# Patient Record
Sex: Male | Born: 1995 | Race: Black or African American | Hispanic: No | Marital: Single | Smoking: Current every day smoker
Health system: Southern US, Community
[De-identification: ages and names within clinical notes are randomized; demographics above are authoritative.]

## PROBLEM LIST (undated history)

## (undated) DIAGNOSIS — F909 Attention-deficit hyperactivity disorder, unspecified type: Secondary | ICD-10-CM

## (undated) DIAGNOSIS — K4090 Unilateral inguinal hernia, without obstruction or gangrene, not specified as recurrent: Secondary | ICD-10-CM

## (undated) DIAGNOSIS — F329 Major depressive disorder, single episode, unspecified: Secondary | ICD-10-CM

## (undated) DIAGNOSIS — S62339A Displaced fracture of neck of unspecified metacarpal bone, initial encounter for closed fracture: Secondary | ICD-10-CM

## (undated) DIAGNOSIS — B350 Tinea barbae and tinea capitis: Secondary | ICD-10-CM

## (undated) DIAGNOSIS — F32A Depression, unspecified: Secondary | ICD-10-CM

## (undated) DIAGNOSIS — F913 Oppositional defiant disorder: Secondary | ICD-10-CM

## (undated) DIAGNOSIS — F1910 Other psychoactive substance abuse, uncomplicated: Secondary | ICD-10-CM

## (undated) DIAGNOSIS — F419 Anxiety disorder, unspecified: Secondary | ICD-10-CM

## (undated) HISTORY — PX: HAND SURGERY: SHX662

## (undated) HISTORY — DX: Displaced fracture of neck of unspecified metacarpal bone, initial encounter for closed fracture: S62.339A

## (undated) HISTORY — DX: Attention-deficit hyperactivity disorder, unspecified type: F90.9

## (undated) HISTORY — DX: Anxiety disorder, unspecified: F41.9

## (undated) HISTORY — PX: HERNIA REPAIR: SHX51

## (undated) HISTORY — DX: Other psychoactive substance abuse, uncomplicated: F19.10

## (undated) HISTORY — DX: Tinea barbae and tinea capitis: B35.0

## (undated) HISTORY — DX: Oppositional defiant disorder: F91.3

---
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
## 2017-02-01 ENCOUNTER — Encounter (HOSPITAL_COMMUNITY): Payer: Self-pay | Admitting: *Deleted

## 2017-02-01 ENCOUNTER — Other Ambulatory Visit: Payer: Self-pay

## 2017-02-01 ENCOUNTER — Emergency Department (HOSPITAL_COMMUNITY)
Admission: EM | Admit: 2017-02-01 | Discharge: 2017-02-01 | Disposition: A | Discharge status: Home / Self Care | Payer: Medicaid Other | Attending: Emergency Medicine | Admitting: Emergency Medicine

## 2017-02-01 ENCOUNTER — Emergency Department (HOSPITAL_COMMUNITY): Payer: Medicaid Other

## 2017-02-01 DIAGNOSIS — F1290 Cannabis use, unspecified, uncomplicated: Secondary | ICD-10-CM | POA: Diagnosis not present

## 2017-02-01 DIAGNOSIS — R0981 Nasal congestion: Secondary | ICD-10-CM | POA: Diagnosis present

## 2017-02-01 DIAGNOSIS — F909 Attention-deficit hyperactivity disorder, unspecified type: Secondary | ICD-10-CM | POA: Diagnosis not present

## 2017-02-01 DIAGNOSIS — F17200 Nicotine dependence, unspecified, uncomplicated: Secondary | ICD-10-CM | POA: Diagnosis not present

## 2017-02-01 DIAGNOSIS — J09X2 Influenza due to identified novel influenza A virus with other respiratory manifestations: Secondary | ICD-10-CM | POA: Diagnosis not present

## 2017-02-01 DIAGNOSIS — F329 Major depressive disorder, single episode, unspecified: Secondary | ICD-10-CM | POA: Insufficient documentation

## 2017-02-01 DIAGNOSIS — J101 Influenza due to other identified influenza virus with other respiratory manifestations: Secondary | ICD-10-CM

## 2017-02-01 LAB — INFLUENZA PANEL BY PCR (TYPE A & B)
Influenza A By PCR: POSITIVE — AB
Influenza B By PCR: NEGATIVE

## 2017-02-01 LAB — RAPID STREP SCREEN (MED CTR MEBANE ONLY): Streptococcus, Group A Screen (Direct): NEGATIVE

## 2017-02-01 MED ORDER — OSELTAMIVIR PHOSPHATE 75 MG PO CAPS: 75.0000 mg | ORAL_CAPSULE | Freq: Two times a day (BID) | ORAL | 0 refills | Status: DC

## 2017-02-01 MED ORDER — BENZONATATE 100 MG PO CAPS: 100.0000 mg | ORAL_CAPSULE | Freq: Three times a day (TID) | ORAL | 0 refills | Status: DC

## 2017-02-01 MED ORDER — IBUPROFEN 400 MG PO TABS
600.0000 mg | ORAL_TABLET | Freq: Once | ORAL | Status: AC
  Administered 2017-02-01: 600 mg via ORAL
  Filled 2017-02-01: qty 1

## 2017-02-01 NOTE — ED Provider Notes (Signed)
[facility] Provider Note   CSN: [id number] Arrival date & time: 02/01/17  1814     History   Chief Complaint Chief Complaint  Patient presents with  . Sore Throat    HPI Andrew Page is a 21 y.o. male.  HPI 21 year old African-American male with no pertinent past medical history presents to the emergency department today with complaints of influenza-like symptoms.  Patient complains of 2 days of nasal congestion, rhinorrhea, chest congestion, productive cough, fever, chills, sore throat, generalized body aches.  Patient has tried over-the-counter cold and flu symptoms with only little relief.  Reports sick contacts with the same symptoms.  Patient denies any associated nausea, vomiting, or urinary symptoms.  Nothing makes his symptoms better or worse. Past Medical History:  Diagnosis Date  . Attention deficit hyperactivity disorder (ADHD)   . Boxers fracture 07/02/11   Cast applied in the ED. Removed by Ortho.  . Depression   . Inguinal hernia    RIGHT  . Oppositional defiant disorder 3/07   Borderline/ low average intelligence (Questionable)  . Tinea capitis     Patient Active Problem List   Diagnosis Date Noted  . Depression 07/22/2014  . Substance abuse (HCC) 07/22/2014  . Right hydrocele 07/22/2014  . Attention deficit hyperactivity disorder (ADHD) 04/18/2006    Past Surgical History:  Procedure Laterality Date  . HAND SURGERY    . HERNIA REPAIR    . INGUINAL HERNIA REPAIR Right 12/10/2014   Procedure: LAPAROSCOPIC RIGHT INGUINAL HERNIA REPAIR WITH MESH;  Surgeon: Axel FillerArmando Ramirez, MD;  Location: [facility];  Service: General;  Laterality: Right;  . INSERTION OF MESH Right 12/10/2014   Procedure: INSERTION OF MESH;  Surgeon: Axel FillerArmando Ramirez, MD;  Location: [facility];  Service: General;  Laterality: Right;       Home Medications    Prior to Admission medications   Medication Sig Start Date End Date Taking? Authorizing  Provider  benzonatate (TESSALON) 100 MG capsule Take 1 capsule (100 mg total) by mouth every 8 (eight) hours. 02/01/17   Rise MuLeaphart, Martia Dalby T, PA-C  ibuprofen (ADVIL,MOTRIN) 600 MG tablet Take 1 tablet (600 mg total) by mouth every 6 (six) hours as needed. 07/03/16   Janne NapoleonNeese, Hope M, NP  oseltamivir (TAMIFLU) 75 MG capsule Take 1 capsule (75 mg total) by mouth 2 (two) times daily. 02/01/17   Rise MuLeaphart, Mansi Tokar T, PA-C    Family History Family History  Problem Relation Age of Onset  . Eczema Unknown        elbows, behind the knees, worse in the summer. Previously treated with triamcinolonce 0.1% cream with sucess.  . Eczema Brother        Twin  . Hypertension Mother   . Diabetes Maternal Grandfather     Social History Social History   Tobacco Use  . Smoking status: Current Some Day Smoker  . Smokeless tobacco: Never Used  Substance Use Topics  . Alcohol use: No  . Drug use: Yes    Types: Marijuana     Allergies   Patient has no known allergies.   Review of Systems Review of Systems  Constitutional: Positive for appetite change, chills and fever. Negative for activity change.  HENT: Positive for congestion, rhinorrhea, sinus pressure, sinus pain, sneezing and sore throat. Negative for ear pain.   Respiratory: Positive for cough. Negative for shortness of breath.   Cardiovascular: Negative for chest pain.  Gastrointestinal: Positive for abdominal pain. Negative for diarrhea, nausea and vomiting.  Genitourinary: Negative for dysuria, flank pain, frequency, hematuria and urgency.  Musculoskeletal: Positive for myalgias.  Skin: Negative for rash.  Neurological: Positive for headaches.     Physical Exam Updated Vital Signs BP 101/86 (BP Location: Right Arm)   Pulse 95   Temp 98.7 F (37.1 C) (Oral)   Resp 16   SpO2 99%   Physical Exam  Constitutional: He appears well-developed and well-nourished. No distress.  HENT:  Head: Normocephalic and atraumatic.  Right Ear:  Tympanic membrane and ear canal normal.  Left Ear: Tympanic membrane and ear canal normal.  Mouth/Throat: Uvula is midline and mucous membranes are normal. No uvula swelling. Posterior oropharyngeal erythema present. No oropharyngeal exudate, posterior oropharyngeal edema or tonsillar abscesses. Tonsils are 1+ on the right. Tonsils are 1+ on the left. No tonsillar exudate.  Eyes: Pupils are equal, round, and reactive to light. Right eye exhibits no discharge. Left eye exhibits no discharge. No scleral icterus.  Neck: Normal range of motion. Neck supple.  No c spine midline tenderness. No paraspinal tenderness. No deformities or step offs noted. Full ROM. Supple. No nuchal rigidity.    Cardiovascular: Normal rate, regular rhythm, normal heart sounds and intact distal pulses. Exam reveals no gallop and no friction rub.  No murmur heard. Pulmonary/Chest: Effort normal and breath sounds normal. No stridor. No respiratory distress. He has no wheezes. He has no rhonchi. He has no rales. He exhibits no tenderness.  Abdominal: Soft. Bowel sounds are normal. There is no tenderness. There is no rebound and no guarding.  Musculoskeletal: Normal range of motion.  Lymphadenopathy:    He has no cervical adenopathy.  Neurological: He is alert.  Skin: Skin is warm and dry. Capillary refill takes less than 2 seconds. No pallor.  Psychiatric: His behavior is normal. Judgment and thought content normal.  Nursing note and vitals reviewed.    ED Treatments / Results  Labs (all labs ordered are listed, but only abnormal results are displayed) Labs Reviewed  INFLUENZA PANEL BY PCR (TYPE A & B) - Abnormal; Notable for the following components:      Result Value   Influenza A By PCR POSITIVE (*)    All other components within normal limits  RAPID STREP SCREEN (NOT AT [facility])  CULTURE, GROUP A STREP [facility])    EKG  EKG Interpretation None       Radiology Dg Chest 2 View  Result Date:  02/01/2017 CLINICAL DATA:  Cough and fever.  Flu like symptoms. EXAM: CHEST  2 VIEW COMPARISON:  None FINDINGS: The heart size and mediastinal contours are within normal limits. Both lungs are clear. The visualized skeletal structures are unremarkable. IMPRESSION: No active cardiopulmonary disease. Electronically Signed   By: Signa Kell M.D.   On: 02/01/2017 20:56    Procedures Procedures (including critical care time)  Medications Ordered in ED Medications  ibuprofen (ADVIL,MOTRIN) tablet 600 mg (600 mg Oral Given 02/01/17 2020)     Initial Impression / Assessment and Plan / ED Course  I have reviewed the triage vital signs and the nursing notes.  Pertinent labs & imaging results that were available during my care of the patient were reviewed by me and considered in my medical decision making (see chart for details).     Patient with symptoms consistent with influenza.  Vitals are stable, low-grade fever.  No signs of dehydration, tolerating PO's.  Lungs are clear.  Tamiflu provided at patient's discretion.  Patient will be discharged with instructions to orally  hydrate, rest, and use over-the-counter medications such as anti-inflammatories ibuprofen and Aleve for muscle aches and Tylenol for fever.  Patient will also be given a cough suppressant.    Final Clinical Impressions(s) / ED Diagnoses   Final diagnoses:  Influenza A    ED Discharge Orders        Ordered    oseltamivir (TAMIFLU) 75 MG capsule  2 times daily     02/01/17 2159    benzonatate (TESSALON) 100 MG capsule  Every 8 hours     02/01/17 2159       Wallace KellerLeaphart, Kellye Mizner T, PA-C 02/01/17 2205    Raeford RazorKohut, Stephen, MD 02/05/17 1143

## 2017-02-01 NOTE — ED Triage Notes (Signed)
Pt in c/o sore throat & generalized pain onset x 2 days ago, pt denies current n\/v/d, A&O x4

## 2017-02-01 NOTE — Discharge Instructions (Signed)
You are positive for the flu.  Have given you Tamiflu to take if you would like however this is not a treatment for the flu.  Symptomatic treatment includes Motrin and Tylenol.  Have given you Tessalon for cough.  We use an over-the-counter decongestant.  Drink plenty of fluids.  Follow-up with your primary care doctor.  Return to the ED with any worsening symptoms.

## 2017-02-01 NOTE — ED Notes (Signed)
Pt departed in NAD, refused use of wheelchair.  

## 2017-02-01 NOTE — ED Notes (Signed)
ED Provider at bedside. 

## 2017-02-04 LAB — CULTURE, GROUP A STREP (THRC)

## 2017-09-21 ENCOUNTER — Emergency Department (HOSPITAL_COMMUNITY): Payer: Self-pay

## 2017-09-21 ENCOUNTER — Emergency Department (HOSPITAL_COMMUNITY)
Admission: EM | Admit: 2017-09-21 | Discharge: 2017-09-21 | Disposition: A | Discharge status: Home / Self Care | Payer: Self-pay | Attending: Emergency Medicine | Admitting: Emergency Medicine

## 2017-09-21 ENCOUNTER — Encounter (HOSPITAL_COMMUNITY): Payer: Self-pay | Admitting: Emergency Medicine

## 2017-09-21 ENCOUNTER — Other Ambulatory Visit: Payer: Self-pay

## 2017-09-21 DIAGNOSIS — Y939 Activity, unspecified: Secondary | ICD-10-CM | POA: Insufficient documentation

## 2017-09-21 DIAGNOSIS — S62342A Nondisplaced fracture of base of third metacarpal bone, right hand, initial encounter for closed fracture: Secondary | ICD-10-CM | POA: Insufficient documentation

## 2017-09-21 DIAGNOSIS — Y999 Unspecified external cause status: Secondary | ICD-10-CM | POA: Insufficient documentation

## 2017-09-21 DIAGNOSIS — Y929 Unspecified place or not applicable: Secondary | ICD-10-CM | POA: Insufficient documentation

## 2017-09-21 DIAGNOSIS — W2209XA Striking against other stationary object, initial encounter: Secondary | ICD-10-CM | POA: Insufficient documentation

## 2017-09-21 DIAGNOSIS — F17200 Nicotine dependence, unspecified, uncomplicated: Secondary | ICD-10-CM | POA: Insufficient documentation

## 2017-09-21 DIAGNOSIS — Z79899 Other long term (current) drug therapy: Secondary | ICD-10-CM | POA: Insufficient documentation

## 2017-09-21 MED ORDER — NAPROXEN 500 MG PO TABS: 500.0000 mg | ORAL_TABLET | Freq: Two times a day (BID) | ORAL | 0 refills | Status: DC

## 2017-09-21 NOTE — ED Triage Notes (Signed)
Pt to ER for evaluation of right swollen knuckle after punching a pole one hour ago due to anger.

## 2017-09-21 NOTE — ED Notes (Signed)
Ortho tech aware 

## 2017-09-21 NOTE — ED Provider Notes (Signed)
[facility] Provider Note   CSN: [id number] Arrival date & time: 09/21/17  1158     History   Chief Complaint Chief Complaint  Patient presents with  . Hand Pain    HPI Andrew Page is a 22 y.o. male who presents to the ED for right hand pain after he punched a pole one hour ago due to being angry. Patient had injured the same hand in the past and injured his fifth MC at that time. Patient has deformity to the right little finger due to the previous injury.  HPI  Past Medical History:  Diagnosis Date  . Attention deficit hyperactivity disorder (ADHD)   . Boxers fracture 07/02/11   Cast applied in the ED. Removed by Ortho.  . Depression   . Inguinal hernia    RIGHT  . Oppositional defiant disorder 3/07   Borderline/ low average intelligence (Questionable)  . Tinea capitis     Patient Active Problem List   Diagnosis Date Noted  . Depression 07/22/2014  . Substance abuse (HCC) 07/22/2014  . Right hydrocele 07/22/2014  . Attention deficit hyperactivity disorder (ADHD) 04/18/2006    Past Surgical History:  Procedure Laterality Date  . HAND SURGERY    . HERNIA REPAIR    . INGUINAL HERNIA REPAIR Right 12/10/2014   Procedure: LAPAROSCOPIC RIGHT INGUINAL HERNIA REPAIR WITH MESH;  Surgeon: Axel FillerArmando Ramirez, MD;  Location: [facility];  Service: General;  Laterality: Right;  . INSERTION OF MESH Right 12/10/2014   Procedure: INSERTION OF MESH;  Surgeon: Axel FillerArmando Ramirez, MD;  Location: [facility];  Service: General;  Laterality: Right;        Home Medications    Prior to Admission medications   Medication Sig Start Date End Date Taking? Authorizing Provider  benzonatate (TESSALON) 100 MG capsule Take 1 capsule (100 mg total) by mouth every 8 (eight) hours. 02/01/17   Rise MuLeaphart, Kenneth T, PA-C  naproxen (NAPROSYN) 500 MG tablet Take 1 tablet (500 mg total) by mouth 2 (two) times daily. 09/21/17   Janne NapoleonNeese, Hope M, NP  oseltamivir (TAMIFLU) 75 MG  capsule Take 1 capsule (75 mg total) by mouth 2 (two) times daily. 02/01/17   Rise MuLeaphart, Kenneth T, PA-C    Family History Family History  Problem Relation Age of Onset  . Eczema Unknown        elbows, behind the knees, worse in the summer. Previously treated with triamcinolonce 0.1% cream with sucess.  . Eczema Brother        Twin  . Hypertension Mother   . Diabetes Maternal Grandfather     Social History Social History   Tobacco Use  . Smoking status: Current Some Day Smoker  . Smokeless tobacco: Never Used  Substance Use Topics  . Alcohol use: No  . Drug use: Yes    Types: Marijuana     Allergies   Patient has no known allergies.   Review of Systems Review of Systems  Musculoskeletal: Positive for arthralgias.       Right hand  All other systems reviewed and are negative.    Physical Exam Updated Vital Signs BP 126/79 (BP Location: Left Arm)   Pulse (!) 58   Temp 98.8 F (37.1 C) (Oral)   Resp 14   SpO2 100%   Physical Exam  Constitutional: He appears well-developed and well-nourished. No distress.  HENT:  Head: Normocephalic.  Eyes: EOM are normal.  Neck: Neck supple.  Cardiovascular: Normal rate.  Pulmonary/Chest: Effort normal.  Musculoskeletal:       Right hand: He exhibits tenderness and swelling. He exhibits normal range of motion, normal capillary refill, no deformity and no laceration. Normal sensation noted. Normal strength noted. He exhibits no thumb/finger opposition.       Hands: Neurological: He is alert.  Skin: Skin is warm and dry.  Psychiatric: He has a normal mood and affect.  Nursing note and vitals reviewed.    ED Treatments / Results  Labs (all labs ordered are listed, but only abnormal results are displayed) Labs Reviewed - No data to display Radiology Dg Hand Complete Right  Result Date: 09/21/2017 CLINICAL DATA:  Patient punched a wall in his home. EXAM: RIGHT HAND - COMPLETE 3+ VIEW COMPARISON:  07/03/2016 FINDINGS:  There is an acute fracture of the neck of the third metacarpal. Fracture is comminuted but associated with minimal displacement. There is deformity of the 5th metacarpal, consistent with remote injury. No radiopaque foreign body or soft tissue gas. IMPRESSION: Acute fracture of the third metacarpal. Remote fracture of the 5th metacarpal. Electronically Signed   By: Norva Pavlov M.D.   On: 09/21/2017 13:14    Procedures Procedures (including critical care time)  Medications Ordered in ED Medications - No data to display   Initial Impression / Assessment and Plan / ED Course  I have reviewed the triage vital signs and the nursing notes. 22 y.o. male here with pain and swelling to the dorsum of the right hand s/p hitting a pole stable for d/c without focal neuro deficits. X-ray shows a nondisplaced  fracture of the third MC. Splint applied, ice, elevation and NSAIDS. F/u with ortho.  Final Clinical Impressions(s) / ED Diagnoses   Final diagnoses:  Closed nondisplaced fracture of base of third metacarpal bone of right hand, initial encounter    ED Discharge Orders        Ordered    naproxen (NAPROSYN) 500 MG tablet  2 times daily     09/21/17 1429       Damian Leavell [place], [place] 09/21/17 1735    Raeford Razor, MD 09/22/17 1737

## 2018-03-08 ENCOUNTER — Emergency Department (HOSPITAL_COMMUNITY)
Admission: EM | Admit: 2018-03-08 | Discharge: 2018-03-08 | Disposition: A | Discharge status: Home / Self Care | Payer: Self-pay | Attending: Emergency Medicine | Admitting: Emergency Medicine

## 2018-03-08 ENCOUNTER — Encounter (HOSPITAL_COMMUNITY): Payer: Self-pay

## 2018-03-08 ENCOUNTER — Emergency Department (HOSPITAL_COMMUNITY): Payer: Self-pay

## 2018-03-08 ENCOUNTER — Other Ambulatory Visit: Payer: Self-pay

## 2018-03-08 DIAGNOSIS — R6889 Other general symptoms and signs: Secondary | ICD-10-CM

## 2018-03-08 DIAGNOSIS — F17210 Nicotine dependence, cigarettes, uncomplicated: Secondary | ICD-10-CM | POA: Insufficient documentation

## 2018-03-08 DIAGNOSIS — B9789 Other viral agents as the cause of diseases classified elsewhere: Secondary | ICD-10-CM

## 2018-03-08 DIAGNOSIS — J069 Acute upper respiratory infection, unspecified: Secondary | ICD-10-CM

## 2018-03-08 DIAGNOSIS — J111 Influenza due to unidentified influenza virus with other respiratory manifestations: Secondary | ICD-10-CM | POA: Insufficient documentation

## 2018-03-08 LAB — GROUP A STREP BY PCR: Group A Strep by PCR: NOT DETECTED

## 2018-03-08 MED ORDER — ACETAMINOPHEN 325 MG PO TABS
650.0000 mg | ORAL_TABLET | Freq: Once | ORAL | Status: AC | PRN
  Administered 2018-03-08: 650 mg via ORAL
  Filled 2018-03-08: qty 2

## 2018-03-08 NOTE — ED Triage Notes (Signed)
Patient is AOx4 and ambulatory. Patient arrived via POV with mother at bedside. Patient chief complaint is cough and Flu like symptoms that began 2-3 days ago. Patient is also complaining of sore throat and states "it feels like something is stuck in my throat".

## 2018-03-08 NOTE — ED Provider Notes (Signed)
[facility]-EMERGENCY DEPT Provider Note   CSN: [id number] Arrival date & time: 03/08/18  1551     History   Chief Complaint Chief Complaint  Patient presents with  . Flu Like Symptoms  . Cough    HPI Andrew Page is a 23 y.o. male with a PMHx of ADHD, depression, ODD, and substance abuse, who presents to the ED with complaints of flulike symptoms that began 3 days ago.  His symptoms include sore throat, rhinorrhea, congestion, cough with yellow sputum production, and chills.  He has been trying Alka-Seltzer without relief of his symptoms.  No known aggravating factors.  He is a non-smoker, did not receive a flu shot this year, but has had sick contacts recently.  He denies any body aches, drooling, trismus, ear pain or drainage, fevers, CP, SOB, abd pain, N/V/D/C, hematuria, dysuria, myalgias, arthralgias, numbness, tingling, focal weakness, or any other complaints at this time.   The history is provided by the patient and medical records. No language interpreter was used.    Past Medical History:  Diagnosis Date  . Attention deficit hyperactivity disorder (ADHD)   . Boxers fracture 07/02/11   Cast applied in the ED. Removed by Ortho.  . Depression   . Inguinal hernia    RIGHT  . Oppositional defiant disorder 3/07   Borderline/ low average intelligence (Questionable)  . Tinea capitis     Patient Active Problem List   Diagnosis Date Noted  . Depression 07/22/2014  . Substance abuse (HCC) 07/22/2014  . Right hydrocele 07/22/2014  . Attention deficit hyperactivity disorder (ADHD) 04/18/2006    Past Surgical History:  Procedure Laterality Date  . HAND SURGERY    . HERNIA REPAIR    . INGUINAL HERNIA REPAIR Right 12/10/2014   Procedure: LAPAROSCOPIC RIGHT INGUINAL HERNIA REPAIR WITH MESH;  Surgeon: Axel Filler, MD;  Location: [facility];  Service: General;  Laterality: Right;  . INSERTION OF MESH Right 12/10/2014   Procedure: INSERTION OF MESH;   Surgeon: Axel Filler, MD;  Location: [facility];  Service: General;  Laterality: Right;        Home Medications    Prior to Admission medications   Medication Sig Start Date End Date Taking? Authorizing Provider  benzonatate (TESSALON) 100 MG capsule Take 1 capsule (100 mg total) by mouth every 8 (eight) hours. Patient not taking: Reported on 03/08/2018 02/01/17   Demetrios Loll T, PA-C  naproxen (NAPROSYN) 500 MG tablet Take 1 tablet (500 mg total) by mouth 2 (two) times daily. Patient not taking: Reported on 03/08/2018 09/21/17   Janne Napoleon, NP  oseltamivir (TAMIFLU) 75 MG capsule Take 1 capsule (75 mg total) by mouth 2 (two) times daily. Patient not taking: Reported on 03/08/2018 02/01/17   Rise Mu, PA-C    Family History Family History  Problem Relation Age of Onset  . Eczema Other        elbows, behind the knees, worse in the summer. Previously treated with triamcinolonce 0.1% cream with sucess.  . Eczema Brother        Twin  . Hypertension Mother   . Diabetes Maternal Grandfather     Social History Social History   Tobacco Use  . Smoking status: Current Every Day Smoker    Packs/day: 1.00    Years: 4.00    Pack years: 4.00  . Smokeless tobacco: Never Used  Substance Use Topics  . Alcohol use: No  . Drug use: Not Currently    Types:  Marijuana     Allergies   Patient has no known allergies.   Review of Systems Review of Systems  Constitutional: Positive for chills. Negative for fever.  HENT: Positive for congestion, rhinorrhea and sore throat. Negative for drooling, ear discharge, ear pain and trouble swallowing.   Respiratory: Positive for cough. Negative for shortness of breath.   Cardiovascular: Negative for chest pain.  Gastrointestinal: Negative for abdominal pain, constipation, diarrhea, nausea and vomiting.  Genitourinary: Negative for dysuria and hematuria.  Musculoskeletal: Negative for arthralgias and myalgias.  Skin: Negative for  color change.  Allergic/Immunologic: Negative for immunocompromised state.  Neurological: Negative for weakness and numbness.  Psychiatric/Behavioral: Negative for confusion.   All other systems reviewed and are negative for acute change except as noted in the HPI.    Physical Exam Updated Vital Signs BP 117/78 (BP Location: Right Arm)   Pulse 88   Temp 98.6 F (37 C) (Oral)   Resp 18   SpO2 100%   Physical Exam Vitals signs and nursing note reviewed.  Constitutional:      General: He is not in acute distress.    Appearance: Normal appearance. He is well-developed. He is not toxic-appearing.     Comments: Afebrile, nontoxic, NAD  HENT:     Head: Normocephalic and atraumatic.     Nose: Congestion present.     Mouth/Throat:     Mouth: Mucous membranes are moist.     Pharynx: Uvula midline. Posterior oropharyngeal erythema present. No pharyngeal swelling, oropharyngeal exudate or uvula swelling.     Tonsils: No tonsillar exudate or tonsillar abscesses. Swelling: 1+ on the right. 1+ on the left.     Comments: Oropharynx injected, without uvular swelling or deviation, no trismus or drooling, with 1+ b/l tonsillar swelling, no exudates.  No PTA.  Eyes:     General:        Right eye: No discharge.        Left eye: No discharge.     Conjunctiva/sclera: Conjunctivae normal.  Neck:     Musculoskeletal: Normal range of motion and neck supple.  Cardiovascular:     Rate and Rhythm: Normal rate and regular rhythm.     Pulses: Normal pulses.     Heart sounds: Normal heart sounds, S1 normal and S2 normal. No murmur. No friction rub. No gallop.   Pulmonary:     Effort: Pulmonary effort is normal. No respiratory distress.     Breath sounds: Normal breath sounds. No decreased breath sounds, wheezing, rhonchi or rales.     Comments: CTAB in all lung fields, no w/r/r, no hypoxia or increased WOB, speaking in full sentences, SpO2 100% on RA  Abdominal:     General: Bowel sounds are normal.  There is no distension.     Palpations: Abdomen is soft. Abdomen is not rigid.     Tenderness: There is no abdominal tenderness. There is no right CVA tenderness, left CVA tenderness, guarding or rebound. Negative signs include Murphy's sign and McBurney's sign.  Musculoskeletal: Normal range of motion.  Skin:    General: Skin is warm and dry.     Findings: No rash.  Neurological:     Mental Status: He is alert and oriented to person, place, and time.     Sensory: Sensation is intact. No sensory deficit.     Motor: Motor function is intact.  Psychiatric:        Mood and Affect: Mood and affect normal.  Behavior: Behavior normal.      ED Treatments / Results  Labs (all labs ordered are listed, but only abnormal results are displayed) Labs Reviewed  GROUP A STREP BY PCR    EKG None  Radiology Dg Chest 2 View  Result Date: 03/08/2018 CLINICAL DATA:  Productive cough. EXAM: CHEST - 2 VIEW COMPARISON:  February 01, 2017 FINDINGS: The heart size and mediastinal contours are within normal limits. Both lungs are clear. The visualized skeletal structures are unremarkable. IMPRESSION: No active cardiopulmonary disease. Electronically Signed   By: Gerome Sam III M.D   On: 03/08/2018 16:58    Procedures Procedures (including critical care time)  Medications Ordered in ED Medications  acetaminophen (TYLENOL) tablet 650 mg (650 mg Oral Given 03/08/18 1624)     Initial Impression / Assessment and Plan / ED Course  I have reviewed the triage vital signs and the nursing notes.  Pertinent labs & imaging results that were available during my care of the patient were reviewed by me and considered in my medical decision making (see chart for details).     22 y.o. male here with flu symptoms x3 days. On exam, mild throat erythema and 1+ b/l tonsillar swelling, no exudates, mild nasal congestion, clear lungs, afebrile and nontoxic appearing. Will get CXR and strep testing, doubt  need for flu testing since it's already outside of the window where tamiflu would be helpful. Will reassess shortly.   5:01 PM CXR neg. Strep test neg. Likely viral illness, outside of tamiflu window; advised OTC remedies for symptomatic relief, and f/up with PCP in 1wk. I explained the diagnosis and have given explicit precautions to return to the ER including for any other new or worsening symptoms. The patient understands and accepts the medical plan as it's been dictated and I have answered their questions. Discharge instructions concerning home care and prescriptions have been given. The patient is STABLE and is discharged to home in good condition.    Final Clinical Impressions(s) / ED Diagnoses   Final diagnoses:  Flu-like symptoms  Viral URI with cough    ED Discharge Orders    [address], [place], [place] 03/08/18 1703    Arby Barrette, MD 03/08/18 2350

## 2018-03-08 NOTE — Discharge Instructions (Signed)
Continue to stay well-hydrated. Gargle warm salt water and spit it out and use chloraseptic spray as needed for sore throat. Continue to alternate between Tylenol and Ibuprofen for pain or fever. Use Mucinex/Robitussin/etc for cough suppression/expectoration of mucus. Use over the counter flonase and the netipot to help with nasal congestion. May consider over-the-counter Benadryl or other antihistamine like Claritin/Zyrtec/etc to decrease secretions and for help with your symptoms. Follow up with your primary care doctor in 5-7 days for recheck of ongoing symptoms. Return to emergency department for emergent changing or worsening of symptoms. °

## 2018-07-01 ENCOUNTER — Other Ambulatory Visit: Payer: Self-pay

## 2018-07-01 ENCOUNTER — Ambulatory Visit (HOSPITAL_COMMUNITY)
Admission: EM | Admit: 2018-07-01 | Discharge: 2018-07-01 | Disposition: A | Discharge status: Home / Self Care | Payer: Self-pay | Attending: Family Medicine | Admitting: Family Medicine

## 2018-07-01 ENCOUNTER — Encounter (HOSPITAL_COMMUNITY): Payer: Self-pay | Admitting: Family Medicine

## 2018-07-01 ENCOUNTER — Ambulatory Visit (INDEPENDENT_AMBULATORY_CARE_PROVIDER_SITE_OTHER): Payer: Self-pay

## 2018-07-01 DIAGNOSIS — R0789 Other chest pain: Secondary | ICD-10-CM

## 2018-07-01 NOTE — Discharge Instructions (Signed)
Your chest x-ray and EKG were normal This is most likely musculoskeletal chest pain You can take ibuprofen 600 mg every 8 hours as needed for pain inflammation Follow up as needed for continued or worsening symptoms

## 2018-07-01 NOTE — ED Provider Notes (Signed)
[facility]    CSN: [id number] Arrival date & time: 07/01/18  1312     History   Chief Complaint Chief Complaint  Patient presents with  . Chest Pain    HPI Andrew Page is a 23 y.o. male.   Pt is a 23 year old male that presents with chest pain. This started last night.  The pain is worse when he takes a deep breath.  Reports that the pain got better after eating last night.  The pain is located central chest.  Describes the pain as pressure in his chest.  Denies any shortness of breath, cough, congestion or fevers.  Denies any injuries.  He has been doing some heavy lifting and working out.  He has not take anything for his symptoms.  He does smoke approximate 1 pack/day.  He also admits to vaping occasionally.  No recent traveling or sick contacts.  No history of DVT or PE.  No calf pain or swelling.  No COVID exposures  ROS per HPI      Past Medical History:  Diagnosis Date  . Attention deficit hyperactivity disorder (ADHD)   . Boxers fracture 07/02/11   Cast applied in the ED. Removed by Ortho.  . Depression   . Inguinal hernia    RIGHT  . Oppositional defiant disorder 3/07   Borderline/ low average intelligence (Questionable)  . Tinea capitis     Patient Active Problem List   Diagnosis Date Noted  . Depression 07/22/2014  . Substance abuse (HCC) 07/22/2014  . Right hydrocele 07/22/2014  . Attention deficit hyperactivity disorder (ADHD) 04/18/2006    Past Surgical History:  Procedure Laterality Date  . HAND SURGERY    . HERNIA REPAIR    . INGUINAL HERNIA REPAIR Right 12/10/2014   Procedure: LAPAROSCOPIC RIGHT INGUINAL HERNIA REPAIR WITH MESH;  Surgeon: Axel FillerArmando Ramirez, MD;  Location: [facility];  Service: General;  Laterality: Right;  . INSERTION OF MESH Right 12/10/2014   Procedure: INSERTION OF MESH;  Surgeon: Axel FillerArmando Ramirez, MD;  Location: [facility];  Service: General;  Laterality: Right;       Home Medications    Prior to Admission  medications   Not on File    Family History Family History  Problem Relation Age of Onset  . Eczema Other        elbows, behind the knees, worse in the summer. Previously treated with triamcinolonce 0.1% cream with sucess.  . Eczema Brother        Twin  . Hypertension Mother   . Diabetes Maternal Grandfather     Social History Social History   Tobacco Use  . Smoking status: Current Every Day Smoker    Packs/day: 1.00    Years: 4.00    Pack years: 4.00  . Smokeless tobacco: Never Used  Substance Use Topics  . Alcohol use: No  . Drug use: Not Currently    Types: Marijuana     Allergies   Patient has no known allergies.   Review of Systems Review of Systems   Physical Exam Triage Vital Signs ED Triage Vitals  Enc Vitals Group     BP 07/01/18 1326 113/76     Pulse Rate 07/01/18 1326 76     Resp 07/01/18 1326 16     Temp 07/01/18 1326 97.8 F (36.6 C)     Temp Source 07/01/18 1326 Oral     SpO2 07/01/18 1326 98 %     Weight 07/01/18 1324 145 lb (  65.8 kg)     Height --      Head Circumference --      Peak Flow --      Pain Score 07/01/18 1324 2     Pain Loc --      Pain Edu? --      Excl. in GC? --    No data found.  Updated Vital Signs BP 113/76 (BP Location: Right Arm)   Pulse 76   Temp 97.8 F (36.6 C) (Oral)   Resp 16   Wt 145 lb (65.8 kg)   SpO2 98%   BMI 22.71 kg/m   Visual Acuity Right Eye Distance:   Left Eye Distance:   Bilateral Distance:    Right Eye Near:   Left Eye Near:    Bilateral Near:     Physical Exam Vitals signs and nursing note reviewed.  Constitutional:      General: He is not in acute distress.    Appearance: He is well-developed. He is not ill-appearing, toxic-appearing or diaphoretic.  HENT:     Head: Normocephalic and atraumatic.  Neck:     Musculoskeletal: Normal range of motion.  Cardiovascular:     Rate and Rhythm: Normal rate and regular rhythm.     Heart sounds: Normal heart sounds.  Pulmonary:      Effort: Pulmonary effort is normal. No tachypnea, accessory muscle usage or respiratory distress.     Breath sounds: No stridor. Decreased breath sounds present.     Comments: Decreased breath sounds in all fields.  May be due to not taking deep breaths.  No pain with palpation of the chest.  Musculoskeletal: Normal range of motion.     Right lower leg: He exhibits no tenderness. No edema.     Left lower leg: He exhibits no tenderness. No edema.  Skin:    General: Skin is warm and dry.  Neurological:     Mental Status: He is alert.      UC Treatments / Results  Labs (all labs ordered are listed, but only abnormal results are displayed) Labs Reviewed - No data to display  EKG None  Radiology Dg Chest 2 View  Result Date: 07/01/2018 CLINICAL DATA:  Chest pain with breathing EXAM: CHEST - 2 VIEW COMPARISON:  03/08/2018 FINDINGS: The heart size and mediastinal contours are within normal limits. Both lungs are clear. The visualized skeletal structures are unremarkable. IMPRESSION: No active cardiopulmonary disease. Electronically Signed   By: Katherine Mantle M.D.   On: 07/01/2018 14:05    Procedures Procedures (including critical care time)  Medications Ordered in UC Medications - No data to display  Initial Impression / Assessment and Plan / UC Course  I have reviewed the triage vital signs and the nursing notes.  Pertinent labs & imaging results that were available during my care of the patient were reviewed by me and considered in my medical decision making (see chart for details).      23 year old with chest pain.  EKG revealed normal sinus rhythm with normal rate.  Chest x-ray was normal No concerning signs or symptoms on exam VSS Most likely pain is musculoskeletal in nature. Recommended using ibuprofen as needed Follow up as needed for continued or worsening symptoms  Final Clinical Impressions(s) / UC Diagnoses   Final diagnoses:  Chest wall pain      Discharge Instructions     Your chest x-ray and EKG were normal This is most likely musculoskeletal chest pain You can take  ibuprofen 600 mg every 8 hours as needed for pain inflammation Follow up as needed for continued or worsening symptoms     ED Prescriptions    None     Controlled Substance Prescriptions [place] Controlled Substance Registry consulted? Not Applicable   Janace Aris, NP 07/01/18 1425

## 2018-07-01 NOTE — ED Triage Notes (Signed)
Pt cc states when he take a deep breath his chest feels heavy. This started today. Pt needs a note for work. Pt states he ate this morning when the pain started and after eating the pain eased up and went away.

## 2018-11-22 ENCOUNTER — Emergency Department (HOSPITAL_COMMUNITY)
Admission: EM | Admit: 2018-11-22 | Discharge: 2018-11-23 | Disposition: A | Discharge status: Home / Self Care | Payer: Self-pay | Attending: Emergency Medicine | Admitting: Emergency Medicine

## 2018-11-22 ENCOUNTER — Other Ambulatory Visit: Payer: Self-pay

## 2018-11-22 DIAGNOSIS — F1210 Cannabis abuse, uncomplicated: Secondary | ICD-10-CM | POA: Insufficient documentation

## 2018-11-22 DIAGNOSIS — R45851 Suicidal ideations: Secondary | ICD-10-CM | POA: Insufficient documentation

## 2018-11-22 DIAGNOSIS — F1920 Other psychoactive substance dependence, uncomplicated: Secondary | ICD-10-CM

## 2018-11-22 DIAGNOSIS — F1020 Alcohol dependence, uncomplicated: Secondary | ICD-10-CM | POA: Insufficient documentation

## 2018-11-22 DIAGNOSIS — F17210 Nicotine dependence, cigarettes, uncomplicated: Secondary | ICD-10-CM | POA: Insufficient documentation

## 2018-11-22 DIAGNOSIS — Z79899 Other long term (current) drug therapy: Secondary | ICD-10-CM | POA: Insufficient documentation

## 2018-11-22 DIAGNOSIS — F1014 Alcohol abuse with alcohol-induced mood disorder: Secondary | ICD-10-CM

## 2018-11-22 LAB — CBC WITH DIFFERENTIAL/PLATELET
Abs Immature Granulocytes: 0.01 10*3/uL (ref 0.00–0.07)
Basophils Absolute: 0.1 10*3/uL (ref 0.0–0.1)
Basophils Relative: 1 %
Eosinophils Absolute: 0.1 10*3/uL (ref 0.0–0.5)
Eosinophils Relative: 1 %
HCT: 46.5 % (ref 39.0–52.0)
Hemoglobin: 14.9 g/dL (ref 13.0–17.0)
Immature Granulocytes: 0 %
Lymphocytes Relative: 27 %
Lymphs Abs: 1.8 10*3/uL (ref 0.7–4.0)
MCH: 32 pg (ref 26.0–34.0)
MCHC: 32 g/dL (ref 30.0–36.0)
MCV: 99.8 fL (ref 80.0–100.0)
Monocytes Absolute: 0.7 10*3/uL (ref 0.1–1.0)
Monocytes Relative: 10 %
Neutro Abs: 4.2 10*3/uL (ref 1.7–7.7)
Neutrophils Relative %: 61 %
Platelets: 242 10*3/uL (ref 150–400)
RBC: 4.66 MIL/uL (ref 4.22–5.81)
RDW: 13.8 % (ref 11.5–15.5)
WBC: 6.9 10*3/uL (ref 4.0–10.5)
nRBC: 0 % (ref 0.0–0.2)

## 2018-11-22 NOTE — ED Triage Notes (Signed)
Per EMS - Pt coming form home, somewhere between 9pm to now he took (6) 10 mg percocet, unknown amount of oxy, and maybe 2 xanax, and "stacks and stacks" of alcohol. Pt states he is not suicidal. States he takes perc for chronic hand and ankle pain/injuries. States the other meds were taken recreationally to help mental pain from twin brother dying. Hx of depression as teenager, has returned since brother died.    20 lt ac 117 77 80 HR 18 R 99% RA  CBG 118 98.6

## 2018-11-23 DIAGNOSIS — R45851 Suicidal ideations: Secondary | ICD-10-CM

## 2018-11-23 DIAGNOSIS — F1014 Alcohol abuse with alcohol-induced mood disorder: Secondary | ICD-10-CM

## 2018-11-23 DIAGNOSIS — F1920 Other psychoactive substance dependence, uncomplicated: Secondary | ICD-10-CM

## 2018-11-23 LAB — COMPREHENSIVE METABOLIC PANEL
ALT: 21 U/L (ref 0–44)
AST: 25 U/L (ref 15–41)
Albumin: 4.5 g/dL (ref 3.5–5.0)
Alkaline Phosphatase: 62 U/L (ref 38–126)
Anion gap: 10 (ref 5–15)
BUN: 8 mg/dL (ref 6–20)
CO2: 27 mmol/L (ref 22–32)
Calcium: 9.6 mg/dL (ref 8.9–10.3)
Chloride: 106 mmol/L (ref 98–111)
Creatinine, Ser: 0.97 mg/dL (ref 0.61–1.24)
GFR calc Af Amer: >60 mL/min (ref >60)
GFR calc non Af Amer: >60 mL/min (ref >60)
Glucose, Bld: 76 mg/dL (ref 70–99)
Potassium: 3.8 mmol/L (ref 3.5–5.1)
Sodium: 143 mmol/L (ref 135–145)
Total Bilirubin: 1 mg/dL (ref 0.3–1.2)
Total Protein: 7.5 g/dL (ref 6.5–8.1)

## 2018-11-23 LAB — RAPID URINE DRUG SCREEN, HOSP PERFORMED
Amphetamines: NOT DETECTED
Barbiturates: NOT DETECTED
Benzodiazepines: POSITIVE — AB
Cocaine: NOT DETECTED
Opiates: NOT DETECTED
Tetrahydrocannabinol: POSITIVE — AB

## 2018-11-23 LAB — ACETAMINOPHEN LEVEL: Acetaminophen (Tylenol), Serum: <10 ug/mL — ABNORMAL LOW (ref 10–30)

## 2018-11-23 LAB — ETHANOL: Alcohol, Ethyl (B): 30 mg/dL — ABNORMAL HIGH (ref <10)

## 2018-11-23 LAB — SALICYLATE LEVEL: Salicylate Lvl: <7 mg/dL (ref 2.8–30.0)

## 2018-11-23 MED ORDER — GABAPENTIN 100 MG PO CAPS
200.0000 mg | ORAL_CAPSULE | Freq: Two times a day (BID) | ORAL | Status: DC
  Administered 2018-11-23: 12:00:00 200 mg via ORAL
  Filled 2018-11-23: qty 6
  Filled 2018-11-23: qty 2

## 2018-11-23 MED ORDER — LORAZEPAM 1 MG PO TABS
0.0000 mg | ORAL_TABLET | Freq: Four times a day (QID) | ORAL | Status: DC
  Administered 2018-11-23: 10:00:00 1 mg via ORAL
  Filled 2018-11-23: qty 1

## 2018-11-23 MED ORDER — VITAMIN B-1 100 MG PO TABS
100.0000 mg | ORAL_TABLET | Freq: Every day | ORAL | Status: DC
  Administered 2018-11-23: 100 mg via ORAL
  Filled 2018-11-23: qty 1

## 2018-11-23 MED ORDER — LORAZEPAM 1 MG PO TABS: 0.0000 mg | ORAL_TABLET | Freq: Two times a day (BID) | ORAL | Status: DC

## 2018-11-23 MED ORDER — ALUM & MAG HYDROXIDE-SIMETH 200-200-20 MG/5ML PO SUSP: 30.0000 mL | Freq: Four times a day (QID) | ORAL | Status: DC | PRN

## 2018-11-23 MED ORDER — LORAZEPAM 2 MG/ML IJ SOLN: 0.0000 mg | Freq: Four times a day (QID) | INTRAMUSCULAR | Status: DC

## 2018-11-23 MED ORDER — ACETAMINOPHEN 325 MG PO TABS: 650.0000 mg | ORAL_TABLET | ORAL | Status: DC | PRN

## 2018-11-23 MED ORDER — LORAZEPAM 2 MG/ML IJ SOLN: 0.0000 mg | Freq: Two times a day (BID) | INTRAMUSCULAR | Status: DC

## 2018-11-23 MED ORDER — NICOTINE 21 MG/24HR TD PT24
21.0000 mg | MEDICATED_PATCH | Freq: Every day | TRANSDERMAL | Status: DC
  Filled 2018-11-23 (×2): qty 1

## 2018-11-23 MED ORDER — THIAMINE HCL 100 MG/ML IJ SOLN: 100.0000 mg | Freq: Every day | INTRAMUSCULAR | Status: DC

## 2018-11-23 NOTE — Discharge Summary (Addendum)
Physician Discharge Summary Note  Patient:  Nicholos Aloisi is an 23 y.o., male MRN:  [id number] DOB:  [birth year] Patient phone:  [phone] (home)  Patient address:   [address] 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 Attention deficit hyperactivity disorder (ADHD)   . Boxers fracture 07/02/11   Cast applied in the ED. Removed by Ortho.  . Depression   . Inguinal hernia    RIGHT  . Oppositional defiant disorder 3/07   Borderline/ low average intelligence (Questionable)  . Tinea capitis     Past Surgical History:  Procedure Laterality Date  . HAND SURGERY    . HERNIA REPAIR    . INGUINAL HERNIA REPAIR Right 12/10/2014   Procedure: LAPAROSCOPIC RIGHT INGUINAL HERNIA REPAIR WITH MESH;  Surgeon: Axel Filler, MD;  Location: [facility];  Service: General;  Laterality: Right;  . INSERTION OF MESH Right 12/10/2014   Procedure: INSERTION OF MESH;  Surgeon: Axel Filler, MD;  Location: [facility];  Service: General;  Laterality: Right;   Family History:  Family History  Problem Relation Age of Onset  . Eczema Other        elbows, behind the knees, worse in the summer. Previously treated with triamcinolonce 0.1% cream with sucess.  . Eczema Brother        Twin  . Hypertension Mother   . Diabetes Maternal Grandfather    Family Psychiatric  History: unknown Social History:  Social History   Substance and Sexual Activity  Alcohol Use No     Social History   Substance and Sexual Activity  Drug Use Not  Currently  . Types: Marijuana    Social History   Socioeconomic History  . Marital status: Single    Spouse name: Not on file  . Number of children: Not on file  . Years of education: Not on file  . Highest education level: Not on file  Occupational History  . Not on file  Social Needs  . Financial resource strain: Not on file  . Food insecurity    Worry: Not on file    Inability: Not on file  . Transportation needs    Medical: Not on file    Non-medical: Not on file  Tobacco Use  . Smoking status: Current Every Day Smoker    Packs/day: 1.00    Years: 4.00    Pack years: 4.00  . Smokeless tobacco: Never Used  Substance and Sexual Activity  . Alcohol use: No  . Drug use: Not Currently    Types: Marijuana  . Sexual activity: Yes    Birth control/protection: Condom  Lifestyle  . Physical activity    Days per week: Not on file    Minutes per session: Not on file  . Stress: Not on file  Relationships  . Social Musician on phone: Not on file    Gets together: Not on file    Attends religious service: Not on file    Active member of club or organization: Not on file    Attends meetings of clubs or organizations: Not  on file    Relationship status: Not on file  Other Topics Concern  . Not on file  Social History Narrative   Lives in [place] with mother twin brother, 2 older brothers. Father not involved.     Hospital Course:  Per EDP Admission Assessment:  23 year old brought into the ER by EMS with chief complaint of suicidal thoughts and depression.   Spoke with patient's mother who called EMS.  She reports that she is concerned about her son.  He is essentially drinking 1/5 of liquor every day and is depressed.  He has been depressed since last year when he lost his twin brother.  She thinks that patient is going to kill himself because of his severe depression.  She reports that today it was difficult for her to get him to control himself which prompted her  to call EMS.  I spoke with the patient who informs me that he indeed has been depressed after he lost his brother.  He also feels like he is the black sheep of the family and not a good son.  He admits to heavy alcohol use and having some problem with alcohol consumption.  He also admits to taking Percocets or Xanax for his pain and has he has severe wrist pain secondary to previous fractures.  He broke down when speaking with me and informed me that he is having suicidal thoughts and that he would rather be dead than alive.  He is not taking any depression medications.  Pt denies nausea, emesis, fevers, chills, chest pains, shortness of breath, headaches, abdominal pain, uti like symptoms.  November 23, 2018: Chart reviewed and patient seen and evaluated by the mental health team.  He states yesterday he was not in a good place because he was thinking about his deceased "twin" brother and began drinking alcohol and using drugs (xanax, opiates, marijuana).  He states he cannot recall making suicidal ideations and denies a plan to hurt himself.  He does endorse daily marijuana and alcohol use; infrequent use of xanax and oxycodone.    He is not seeing an outpatient mental health provider, and not taking antidepressant medications.   He states he feels better today and cannot recall "ever wanting to hurt myself".  The patient endorses protective factors, "I have my kids, they need me.  What I look like hurting myself?" He talks in a loud tone but this appears to be his baseline behavior.   Devon Pretty was evaluated for stability and plans for continued recovery upon discharge.  Tiran Sauseda motivation was an integral factor for scheduling further treatment.  Employment, transportation, health status, family support, were considered during his 24 hour observation.  He was offered further treatment options upon discharge including but not limited to Intensive Outpatient, Outpatient treatment.  Per  peer support notes, the patient endorses polysubstance use but he is not interested in abstinence but wants to cut down on usage at this time.  Nikolay Demetriou will follow up with the services as listed below under Follow up Information.     Upon completion of this assessment, the Asriel Westrup was both mentally and medically stable for discharge denying suicidal/homicidal ideation, auditory/visual/tactile hallucinations, delusional thoughts and paranoia.       Patient will be discharged to home with outpatient behavioral health follow-up.   Physical Findings: AIMS:  , ,  ,  ,    CIWA:    COWS:     Musculoskeletal: Strength & Muscle Tone: within normal  limits Gait & Station: normal Patient leans: N/A  Psychiatric Specialty Exam: Physical Exam  Constitutional: He is oriented to person, place, and time. He appears well-developed.  Eyes: Pupils are equal, round, and reactive to light.  Neck: Normal range of motion.  Musculoskeletal: Normal range of motion.  Neurological: He is alert and oriented to person, place, and time.    Review of Systems  Psychiatric/Behavioral: Positive for substance abuse (endorses THC, opiates, benzodiazepines,). Negative for hallucinations and suicidal ideas.    Blood pressure 124/90, pulse 72, temperature 98.6 F (37 C), temperature source Oral, resp. rate 20, height 5\' 11"  (1.803 m), weight 65.8 kg, SpO2 100 %.Body mass index is 20.22 kg/m.  General Appearance: Casual and wearing hospital scrubs  Eye Contact:  Good  Speech:  Clear and Coherent  Volume:  Increased  Mood:  Anxious and endorses anxiety related to current hospitalization  Affect:  Congruent  Thought Process:  Coherent and Goal Directed  Orientation:  Full (Time, Place, and Person)  Thought Content:  Logical  Suicidal Thoughts:  No  Homicidal Thoughts:  No  Memory:  Immediate;   Good Recent;   Good Remote;   Good  Judgement:  Intact  Insight:  Fair  Psychomotor Activity:  Increased   Concentration:  Concentration: Fair and Attention Span: Fair  Recall:  Good  Fund of Knowledge:  Good  Language:  Good  Akathisia:  Negative  Handed:  Right  AIMS (if indicated):     Assets:  Housing Resilience  ADL's:  Intact  Cognition:  WNL  Sleep:   <6 hours        Has this patient used any form of tobacco in the last 30 days? (Cigarettes, Smokeless Tobacco, Cigars, and/or Pipes) Yes, Yes, Prescription not provided because: the patient declines  Blood Alcohol level:  Lab Results  Component Value Date   ETH 30 (H) 11/22/2018   ETH <5 06/07/2014    Metabolic Disorder Labs:  No results found for: HGBA1C, MPG No results found for: PROLACTIN No results found for: CHOL, TRIG, HDL, CHOLHDL, VLDL, LDLCALC  See Psychiatric Specialty Exam and Suicide Risk Assessment completed by Attending Physician prior to discharge.  Discharge destination:  Home  Is patient on multiple antipsychotic therapies at discharge:  No   Has Patient had three or more failed trials of antipsychotic monotherapy by history:  No  Recommended Plan for Multiple Antipsychotic Therapies: NA  Discharge Instructions    Diet - low sodium heart healthy   Complete by: As directed    Increase activity slowly   Complete by: As directed      Allergies as of 11/23/2018   No Known Allergies     Medication List    STOP taking these medications   alprazolam 2 MG tablet Commonly known as: XANAX   oxyCODONE-acetaminophen 10-325 MG tablet Commonly known as: PERCOCET      Follow-up Information    [facility] Follow up.   Why: Please contact [facility] between 8am and 5pm Monday through Friday to schedule an appointment for outpatient behaviorial health services. [facility] located at [address], [place], [phone] Contact information: [address] [place] [id number] [phone]           Follow-up recommendations:  Activity:  as tolerated Diet:  as tolerated Other:  follow-up with  community mental health resources as previously discussed  Will provide samples for Gabapentin to help with mood disorder as well as effects of alcohol withdrawal symptoms.  Comments:   Discharge home.  The patient appears reasonably screened and/or stabilized for discharge and does not appear to have emergency medical/psychiatric concerns/conditions requiring further screening, evaluation, or treatment at this time prior to discharge.    Take all of you medications as prescribed by your mental healthcare provider.  Report any adverse effects and reactions from your medications to your outpatient provider promptly.  Do not engage in alcohol and or illegal drug use while on prescription medicines. Keep all scheduled appointments. This is to ensure that you are getting refills on time and to avoid any interruption in your medication.  If you are unable to keep an appointment call to reschedule.  Be sure to follow up with resources and follow ups given. In the event of worsening symptoms call the crisis hotline, 911, and or go to the nearest emergency department for appropriate evaluation and treatment of symptoms. Follow-up with your primary care provider for your medical issues, concerns and or health care needs.     Signed: Chales Abrahams, NP 11/23/2018, 3:59 PM  Patient seen face-to-face for psychiatric evaluation, chart reviewed and case discussed with the physician extender and developed treatment plan. Reviewed the information documented and agree with the treatment plan. Thedore Mins, MD

## 2018-11-23 NOTE — Patient Outreach (Addendum)
[facility] met with the patient to provide substance use recovery support and provide information for substance use recovery resources. Patient reports a history of poly substance use with alcohol, marijuana, opiates, and benzodiazepine use. Patient says that he uses these substances to help deal with depression and chronic pain in right hand. Patient does not want to be abstinent, but wants to cut down on his poly substance use at this time. CPSS talked to the patient about the dangers of using substances to help deal with emotional pain and how the patient's poly substance use can lead to unmanageable, active substance use addiction. CPSS then talked to the patient about and provided information for available outpatient dual diagnosis resources for individual counseling and medication management. Some of these resources include a flier detailing the [employer] dual diagnosis treatment services. Additionally, CPSS provided information for other substance use recovery resources including a residential substance use treatment center list, NA/AA meeting list, and CPSS contact information. CPSS strongly encouraged the patient to follow up with [facility]PSS if needed for further substance use recovery support. CPSS also informed the patient to not hesitate in contacting [facility] for further help with getting connected to substance use recovery resources after discharge from the [facility].

## 2018-11-23 NOTE — ED Notes (Signed)
Fluids encouraged and urine specimen requested waiting to swab for COVID until TTS completed

## 2018-11-23 NOTE — Progress Notes (Addendum)
CSW received notification that this patient will discharge home with outpatient behavioral health follow up. CSW added contact information for [facility] to the patient's AVS.  Madilyn Fireman, MSW, LCSW-A Clinical Social Worker   [facility]   Medical ICU [phone]

## 2018-11-23 NOTE — ED Notes (Addendum)
Pt. Has 2 belongings bag locked up in [address] in [place]. Pt. Has 1 black pant, 1 black jacket, 1 pr. Black shoes, 1 blue sock, 1 red sock, 1 black cell phone and 1 white t-shirt. Pt. Has no wallet and no money.

## 2018-11-23 NOTE — ED Notes (Signed)
After long discussion Andrew Page agreed to sign voluntary OBSERVATION admission consent form which was faxed to [facility] Assessment office and TTS along with EDP were notified.

## 2018-11-23 NOTE — ED Provider Notes (Signed)
[facility]-EMERGENCY DEPT Provider Note   CSN: [id number] Arrival date & time: 11/22/18  2229     History   Chief Complaint Chief Complaint  Patient presents with  . Drug Overdose  . Depression    HPI Andrew Page is a 23 y.o. male.     HPI  23 year old brought into the ER by EMS with chief complaint of suicidal thoughts and depression.   Spoke with patient's mother who called EMS.  She reports that she is concerned about her son.  He is essentially drinking 1/5 of liquor every day and is depressed.  He has been depressed since last year when he lost his twin brother.  She thinks that patient is going to kill himself because of his severe depression.  She reports that today it was difficult for her to get him to control himself which prompted her to call EMS.  I spoke with the patient who informs me that he indeed has been depressed after he lost his brother.  He also feels like he is the black sheep of the family and not a good son.  He admits to heavy alcohol use and having some problem with alcohol consumption.  He also admits to taking Percocets or Xanax for his pain and has he has severe wrist pain secondary to previous fractures.  He broke down when speaking with me and informed me that he is having suicidal thoughts and that he would rather be dead than alive.  He is not taking any depression medications.  Pt denies nausea, emesis, fevers, chills, chest pains, shortness of breath, headaches, abdominal pain, uti like symptoms.   Past Medical History:  Diagnosis Date  . Attention deficit hyperactivity disorder (ADHD)   . Boxers fracture 07/02/11   Cast applied in the ED. Removed by Ortho.  . Depression   . Inguinal hernia    RIGHT  . Oppositional defiant disorder 3/07   Borderline/ low average intelligence (Questionable)  . Tinea capitis     Patient Active Problem List   Diagnosis Date Noted  . Depression 07/22/2014  . Substance abuse  (HCC) 07/22/2014  . Right hydrocele 07/22/2014  . Attention deficit hyperactivity disorder (ADHD) 04/18/2006    Past Surgical History:  Procedure Laterality Date  . HAND SURGERY    . HERNIA REPAIR    . INGUINAL HERNIA REPAIR Right 12/10/2014   Procedure: LAPAROSCOPIC RIGHT INGUINAL HERNIA REPAIR WITH MESH;  Surgeon: Axel Filler, MD;  Location: [facility];  Service: General;  Laterality: Right;  . INSERTION OF MESH Right 12/10/2014   Procedure: INSERTION OF MESH;  Surgeon: Axel Filler, MD;  Location: [facility];  Service: General;  Laterality: Right;        Home Medications    Prior to Admission medications   Medication Sig Start Date End Date Taking? Authorizing Provider  alprazolam Prudy Feeler) 2 MG tablet Take 4 mg by mouth once.   Yes [provider]  oxyCODONE-acetaminophen (PERCOCET) 10-325 MG tablet Take 1 tablet by mouth once.   Yes [provider]    Family History Family History  Problem Relation Age of Onset  . Eczema Other        elbows, behind the knees, worse in the summer. Previously treated with triamcinolonce 0.1% cream with sucess.  . Eczema Brother        Twin  . Hypertension Mother   . Diabetes Maternal Grandfather     Social History Social History   Tobacco Use  .  Smoking status: Current Every Day Smoker    Packs/day: 1.00    Years: 4.00    Pack years: 4.00  . Smokeless tobacco: Never Used  Substance Use Topics  . Alcohol use: No  . Drug use: Not Currently    Types: Marijuana     Allergies   Patient has no known allergies.   Review of Systems Review of Systems  Constitutional: Positive for activity change.  Respiratory: Negative for shortness of breath.   Cardiovascular: Negative for chest pain.  Gastrointestinal: Negative for nausea and vomiting.  All other systems reviewed and are negative.    Physical Exam Updated Vital Signs BP (!) 120/94 (BP Location: Right Arm)   Pulse 86   Temp 98.4 F (36.9 C)   Resp 16    Ht 5\' 11"  (1.803 m)   Wt 65.8 kg   SpO2 100%   BMI 20.22 kg/m   Physical Exam Vitals signs and nursing note reviewed.  Constitutional:      Appearance: He is well-developed.  HENT:     Head: Atraumatic.  Neck:     Musculoskeletal: Neck supple.  Cardiovascular:     Rate and Rhythm: Normal rate.  Pulmonary:     Effort: Pulmonary effort is normal.  Skin:    General: Skin is warm.  Neurological:     Mental Status: He is alert and oriented to person, place, and time.  Psychiatric:     Comments: Depressed affect      ED Treatments / Results  Labs (all labs ordered are listed, but only abnormal results are displayed) Labs Reviewed  COMPREHENSIVE METABOLIC PANEL  CBC WITH DIFFERENTIAL/PLATELET  ETHANOL  RAPID URINE DRUG SCREEN, HOSP PERFORMED  SALICYLATE LEVEL  ACETAMINOPHEN LEVEL    EKG EKG Interpretation  Date/Time:  Saturday November 22 2018 22:51:39 EDT Ventricular Rate:  72 PR Interval:    QRS Duration: 83 QT Interval:  368 QTC Calculation: 403 R Axis:   84 Text Interpretation:  Sinus rhythm Probable left atrial enlargement ST elev, probable normal early repol pattern No acute changes No significant change since last tracing Confirmed by Varney Biles [id number]) on 11/22/2018 11:51:19 PM   Radiology No results found.  Procedures Procedures (including critical care time)  Medications Ordered in ED Medications - No data to display   Initial Impression / Assessment and Plan / ED Course  I have reviewed the triage vital signs and the nursing notes.  Pertinent labs & imaging results that were available during my care of the patient were reviewed by me and considered in my medical decision making (see chart for details).        23 year old comes in a chief complaint of depression and suicidal ideation. It appears that his depression has gotten worse ever since he lost his brother unexpectedly last year.  He is drinking heavily and considers himself to  black sheep of the family.  He broke down and admitted to [facility].  I spoke with the mother who concurs that patient is spiraling out of control.  He is medically cleared for psych evaluation. He is voluntarily here.  Final Clinical Impressions(s) / ED Diagnoses   Final diagnoses:  Alcoholism [facility])  Suicidal ideations    ED Discharge Orders    None       Varney Biles, MD 11/23/18 [phone]
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
## 2018-11-23 NOTE — ED Notes (Signed)
Pt using phone at this time.  

## 2019-01-01 ENCOUNTER — Ambulatory Visit (HOSPITAL_COMMUNITY)
Admission: EM | Admit: 2019-01-01 | Discharge: 2019-01-01 | Disposition: A | Discharge status: Home / Self Care | Payer: Self-pay | Attending: Internal Medicine | Admitting: Internal Medicine

## 2019-01-01 ENCOUNTER — Other Ambulatory Visit: Payer: Self-pay

## 2019-01-01 ENCOUNTER — Encounter (HOSPITAL_COMMUNITY): Payer: Self-pay

## 2019-01-01 DIAGNOSIS — H109 Unspecified conjunctivitis: Secondary | ICD-10-CM

## 2019-01-01 MED ORDER — FLUORESCEIN SODIUM 1 MG OP STRP
ORAL_STRIP | OPHTHALMIC | Status: AC
  Filled 2019-01-01: qty 1

## 2019-01-01 MED ORDER — ERYTHROMYCIN 5 MG/GM OP OINT: TOPICAL_OINTMENT | OPHTHALMIC | 0 refills | Status: DC

## 2019-01-01 MED ORDER — TETRACAINE HCL 0.5 % OP SOLN
OPHTHALMIC | Status: AC
  Filled 2019-01-01: qty 4

## 2019-01-01 NOTE — ED Provider Notes (Signed)
[facility]    CSN: [id number] Arrival date & time: 01/01/19  1541      History   Chief Complaint Chief Complaint  Patient presents with  . Eye Problem    HPI Ed Mandich is a 23 y.o. male male with no past medical history comes to urgent care with complaints of left eye irritation that started this morning.  Patient denies scratching his eyes.  He woke up this morning with some discharge and crusting of the left eyelids.  He admits to having some sensitivity to light.  No trauma to the eye.  No double vision.   HPI  Past Medical History:  Diagnosis Date  . Attention deficit hyperactivity disorder (ADHD)   . Boxers fracture 07/02/11   Cast applied in the ED. Removed by Ortho.  . Depression   . Inguinal hernia    RIGHT  . Oppositional defiant disorder 3/07   Borderline/ low average intelligence (Questionable)  . Tinea capitis     Patient Active Problem List   Diagnosis Date Noted  . 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 Alcohol abuse with alcohol-induced mood disorder (HCC) 11/23/2018  . Suicidal ideations   . Depression 07/22/2014  . Substance abuse (HCC) 07/22/2014  . Right hydrocele 07/22/2014  . Attention deficit hyperactivity disorder (ADHD) 04/18/2006    Past Surgical History:  Procedure Laterality Date  . HAND SURGERY    . HERNIA REPAIR    . INGUINAL HERNIA REPAIR Right 12/10/2014   Procedure: LAPAROSCOPIC RIGHT INGUINAL HERNIA REPAIR WITH MESH;  Surgeon: Axel Filler, MD;  [facility];  Service: General;  Laterality: Right;  . INSERTION OF MESH Right 12/10/2014   Procedure: INSERTION OF MESH;  Surgeon: Axel Filler, MD;  [facility];  Service: General;  Laterality: Right;       Home Medications    Prior to Admission medications   Medication Sig Start Date End Date Taking? Authorizing Provider  erythromycin ophthalmic ointment Place a 1/2 inch ribbon of ointment into  the left lower eyelid. 01/01/19   LampteyBritta Mccreedy, MD    Family History Family History  Problem Relation Age of Onset  . Eczema Other        elbows, behind the knees, worse in the summer. Previously treated with triamcinolonce 0.1% cream with sucess.  . Eczema Brother        Twin  . Hypertension Mother   . Diabetes Maternal Grandfather     Social History Social History   Tobacco Use  . Smoking status: Current Every Day Smoker    Packs/day: 1.00    Years: 4.00    Pack years: 4.00  . Smokeless tobacco: Never Used  Substance Use Topics  . Alcohol use: No  . Drug use: Not Currently    Types: Marijuana     Allergies   Patient has no known allergies.   Review of Systems Review of Systems  Constitutional: Negative.   HENT: Negative.   Eyes: Positive for photophobia, pain, discharge and redness. Negative for itching and visual disturbance.  Respiratory: Negative.   Cardiovascular: Negative.   Gastrointestinal: Negative.      Physical Exam Triage Vital Signs ED Triage Vitals  Enc Vitals Group     BP 01/01/19 1622 115/69     Pulse Rate 01/01/19 1622 66     Resp 01/01/19 1622 14     Temp 01/01/19 1622 98.4 F (36.9 C)     Temp Source 01/01/19  1622 Oral     SpO2 01/01/19 1622 100 %     Weight --      Height --      Head Circumference --      Peak Flow --      Pain Score 01/01/19 1621 4     Pain Loc --      Pain Edu? --      Excl. in [place]? --    No data found.  Updated Vital Signs BP 115/69 (BP Location: Left Arm)   Pulse 66   Temp 98.4 F (36.9 C) (Oral)   Resp 14   SpO2 100%   Visual Acuity Right Eye Distance: 20/30 Left Eye Distance: 20/30 Bilateral Distance: 20/25  Right Eye Near:   Left Eye Near:    Bilateral Near:     Physical Exam Vitals signs and nursing note reviewed.  Constitutional:      General: He is not in acute distress.    Appearance: He is not ill-appearing.  Eyes:     Comments: Left eye conjunctival erythema.  Mild  swelling of the left upper eyelid.  Cardiovascular:     Rate and Rhythm: Normal rate and regular rhythm.     Pulses: Normal pulses.  Pulmonary:     Effort: Pulmonary effort is normal.     Breath sounds: Normal breath sounds.  Abdominal:     General: Bowel sounds are normal.     Palpations: Abdomen is soft.  Musculoskeletal: Normal range of motion.  Skin:    Capillary Refill: Capillary refill takes less than 2 seconds.  Neurological:     General: No focal deficit present.     Mental Status: He is alert and oriented to person, place, and time.      UC Treatments / Results  Labs (all labs ordered are listed, but only abnormal results are displayed) Labs Reviewed - No data to display  EKG   Radiology No results found.  Procedures Procedures (including critical care time)  Medications Ordered in UC Medications  tetracaine (PONTOCAINE) 0.5 % ophthalmic solution (has no administration in time range)  fluorescein 1 MG ophthalmic strip (has no administration in time range)    Initial Impression / Assessment and Plan / UC Course  I have reviewed the triage vital signs and the nursing notes.  Pertinent labs & imaging results that were available during my care of the patient were reviewed by me and considered in my medical decision making (see chart for details).     1.  Acute conjunctivitis involving the left eye: Fluorescein staining was negative for corneal ulceration Erythromycin eye ointment for 5 days If patient has worsening eye pain, increasing swelling of the eyelids or visual changes, patient is advised to go to the emergency department to be reevaluated. Final Clinical Impressions(s) / UC Diagnoses   Final diagnoses:  Conjunctivitis of left eye, unspecified conjunctivitis type   Discharge Instructions   None    ED Prescriptions    Medication Sig Dispense Auth. Provider   erythromycin ophthalmic ointment Place a 1/2 inch ribbon of ointment into the left  lower eyelid. 3.5 g Lamptey, Myrene Galas, MD     PDMP not reviewed this encounter.   Chase Picket, MD 01/01/19 1827

## 2019-01-01 NOTE — ED Triage Notes (Signed)
Pt states his left eye is irritate x 1 day. Pt states he woke up with crusted left eye. Pt states having left eye pain.

## 2019-09-16 ENCOUNTER — Encounter (HOSPITAL_COMMUNITY): Payer: Self-pay | Admitting: Emergency Medicine

## 2019-09-16 ENCOUNTER — Ambulatory Visit (HOSPITAL_COMMUNITY)
Admission: EM | Admit: 2019-09-16 | Discharge: 2019-09-16 | Disposition: A | Discharge status: Home / Self Care | Payer: Self-pay | Attending: Urgent Care | Admitting: Urgent Care

## 2019-09-16 ENCOUNTER — Other Ambulatory Visit: Payer: Self-pay

## 2019-09-16 DIAGNOSIS — M79651 Pain in right thigh: Secondary | ICD-10-CM

## 2019-09-16 DIAGNOSIS — L03115 Cellulitis of right lower limb: Secondary | ICD-10-CM

## 2019-09-16 MED ORDER — DOXYCYCLINE HYCLATE 100 MG PO CAPS: 100.0000 mg | ORAL_CAPSULE | Freq: Two times a day (BID) | ORAL | 0 refills | Status: DC

## 2019-09-16 MED ORDER — NAPROXEN 500 MG PO TABS: 500.0000 mg | ORAL_TABLET | Freq: Two times a day (BID) | ORAL | 0 refills | Status: DC

## 2019-09-16 NOTE — ED Provider Notes (Signed)
[facility]   MRN: [id number] DOB: 05-21-1995  Subjective:   Andrew Page is a 24 y.o. male presenting for 3-day history of acute onset persistent moderately severe right medial thigh pain, hardness.  Patient is sexually active with 2 females, does not have any concern for STI.  Does not give her testing at this time. Denies dysuria, hematuria, urinary frequency, penile discharge, penile swelling, testicular pain, testicular swelling, anal pain, groin pain.  Denies history of abscesses.  Has a history of right hernia repair but is higher up over the right superior groin area and not his thigh.  Denies taking chronic medications.  No Known Allergies  Past Medical History:  Diagnosis Date  . Attention deficit hyperactivity disorder (ADHD)   . Boxers fracture 07/02/11   Cast applied in the ED. Removed by Ortho.  . Depression   . Inguinal hernia    RIGHT  . Oppositional defiant disorder 3/07   Borderline/ low average intelligence (Questionable)  . Tinea capitis      Past Surgical History:  Procedure Laterality Date  . HAND SURGERY    . HERNIA REPAIR    . INGUINAL HERNIA REPAIR Right 12/10/2014   Procedure: LAPAROSCOPIC RIGHT INGUINAL HERNIA REPAIR WITH MESH;  Surgeon: Axel Filler, MD;  Location: [facility];  Service: General;  Laterality: Right;  . INSERTION OF MESH Right 12/10/2014   Procedure: INSERTION OF MESH;  Surgeon: Axel Filler, MD;  Location: [facility];  Service: General;  Laterality: Right;    Family History  Problem Relation Age of Onset  . Eczema Other        elbows, behind the knees, worse in the summer. Previously treated with triamcinolonce 0.1% cream with sucess.  . Eczema Brother        Twin  . Hypertension Mother   . Diabetes Maternal Grandfather     Social History   Tobacco Use  . Smoking status: Current Every Day Smoker    Packs/day: 1.00    Years: 4.00    Pack years: 4.00  . Smokeless tobacco: Never Used  Vaping Use  . Vaping Use:  Never used  Substance Use Topics  . Alcohol use: No  . Drug use: Not Currently    Types: Marijuana    ROS   Objective:   Vitals: BP 112/78 (BP Location: Left Arm)   Pulse 61   Temp 98.1 F (36.7 C) (Oral)   Resp 16   SpO2 100%   Physical Exam Constitutional:      General: He is not in acute distress.    Appearance: Normal appearance. He is well-developed and normal weight. He is not ill-appearing, toxic-appearing or diaphoretic.  HENT:     Head: Normocephalic and atraumatic.     Right Ear: External ear normal.     Left Ear: External ear normal.     Nose: Nose normal.     Mouth/Throat:     Pharynx: Oropharynx is clear.  Eyes:     General: No scleral icterus.       Right eye: No discharge.        Left eye: No discharge.     Extraocular Movements: Extraocular movements intact.     Pupils: Pupils are equal, round, and reactive to light.  Cardiovascular:     Rate and Rhythm: Normal rate.  Pulmonary:     Effort: Pulmonary effort is normal.  Genitourinary:    Penis: Circumcised. No phimosis, paraphimosis, hypospadias, erythema, tenderness, discharge, swelling or lesions.   Musculoskeletal:  Cervical back: Normal range of motion.       Legs:  Neurological:     Mental Status: He is alert and oriented to person, place, and time.  Psychiatric:        Mood and Affect: Mood normal.        Behavior: Behavior normal.        Thought Content: Thought content normal.        Judgment: Judgment normal.       Assessment and Plan :   PDMP not reviewed this encounter.  1. Cellulitis of right thigh   2. Pain of right thigh     Start doxycycline to cover for infectious process, cellulitis.  Use naproxen for pain and inflammation.  Patient refused STI testing for now.  Recheck in 2 days if no improvement.  Counseled patient on potential for adverse effects with medications prescribed/recommended today, ER and return-to-clinic precautions discussed, patient verbalized  understanding.    Wallis Bamberg, [place] 09/16/19 1701

## 2019-09-16 NOTE — ED Triage Notes (Signed)
Patient reports having had hernia repair on right several years ago.  3 days ago noticed this lump on right side of groin . Severe pain started last night.  Reports knot is getting larger

## 2019-09-16 NOTE — Discharge Instructions (Signed)
Start doxycycline to cover an infection of the soft tissue of your skin called cellulitis.  Take this twice daily with food.  Use naproxen for pain and inflammation as needed, again twice daily with food.  If you have no improvement by Friday morning come back for recheck.

## 2019-09-26 ENCOUNTER — Encounter (HOSPITAL_COMMUNITY): Payer: Self-pay | Admitting: Emergency Medicine

## 2019-09-26 ENCOUNTER — Emergency Department (HOSPITAL_COMMUNITY)
Admission: EM | Admit: 2019-09-26 | Discharge: 2019-09-26 | Disposition: A | Discharge status: Home / Self Care | Payer: Self-pay | Attending: Emergency Medicine | Admitting: Emergency Medicine

## 2019-09-26 ENCOUNTER — Other Ambulatory Visit: Payer: Self-pay

## 2019-09-26 DIAGNOSIS — R112 Nausea with vomiting, unspecified: Secondary | ICD-10-CM | POA: Insufficient documentation

## 2019-09-26 DIAGNOSIS — F909 Attention-deficit hyperactivity disorder, unspecified type: Secondary | ICD-10-CM | POA: Insufficient documentation

## 2019-09-26 DIAGNOSIS — K529 Noninfective gastroenteritis and colitis, unspecified: Secondary | ICD-10-CM

## 2019-09-26 DIAGNOSIS — F17200 Nicotine dependence, unspecified, uncomplicated: Secondary | ICD-10-CM | POA: Insufficient documentation

## 2019-09-26 DIAGNOSIS — R110 Nausea: Secondary | ICD-10-CM

## 2019-09-26 DIAGNOSIS — F329 Major depressive disorder, single episode, unspecified: Secondary | ICD-10-CM | POA: Insufficient documentation

## 2019-09-26 DIAGNOSIS — Z79899 Other long term (current) drug therapy: Secondary | ICD-10-CM | POA: Insufficient documentation

## 2019-09-26 LAB — CBC
HCT: 44.4 % (ref 39.0–52.0)
Hemoglobin: 14.5 g/dL (ref 13.0–17.0)
MCH: 31.5 pg (ref 26.0–34.0)
MCHC: 32.7 g/dL (ref 30.0–36.0)
MCV: 96.3 fL (ref 80.0–100.0)
Platelets: 245 10*3/uL (ref 150–400)
RBC: 4.61 MIL/uL (ref 4.22–5.81)
RDW: 13.2 % (ref 11.5–15.5)
WBC: 5.6 10*3/uL (ref 4.0–10.5)
nRBC: 0 % (ref 0.0–0.2)

## 2019-09-26 LAB — COMPREHENSIVE METABOLIC PANEL
ALT: 37 U/L (ref 0–44)
AST: 73 U/L — ABNORMAL HIGH (ref 15–41)
Albumin: 4.1 g/dL (ref 3.5–5.0)
Alkaline Phosphatase: 64 U/L (ref 38–126)
Anion gap: 10 (ref 5–15)
BUN: 9 mg/dL (ref 6–20)
CO2: 27 mmol/L (ref 22–32)
Calcium: 9.2 mg/dL (ref 8.9–10.3)
Chloride: 101 mmol/L (ref 98–111)
Creatinine, Ser: 0.99 mg/dL (ref 0.61–1.24)
GFR calc Af Amer: >60 mL/min (ref >60)
GFR calc non Af Amer: >60 mL/min (ref >60)
Glucose, Bld: 109 mg/dL — ABNORMAL HIGH (ref 70–99)
Potassium: 3.6 mmol/L (ref 3.5–5.1)
Sodium: 138 mmol/L (ref 135–145)
Total Bilirubin: 1.2 mg/dL (ref 0.3–1.2)
Total Protein: 7.1 g/dL (ref 6.5–8.1)

## 2019-09-26 LAB — LIPASE, BLOOD: Lipase: 26 U/L (ref 11–51)

## 2019-09-26 MED ORDER — ONDANSETRON HCL 4 MG PO TABS: 4.0000 mg | ORAL_TABLET | Freq: Three times a day (TID) | ORAL | 0 refills | Status: DC | PRN

## 2019-09-26 MED ORDER — SODIUM CHLORIDE 0.9% FLUSH: 3.0000 mL | Freq: Once | INTRAVENOUS | Status: DC

## 2019-09-26 MED ORDER — ONDANSETRON 4 MG PO TBDP
4.0000 mg | ORAL_TABLET | Freq: Once | ORAL | Status: AC
  Administered 2019-09-26: 4 mg via ORAL
  Filled 2019-09-26: qty 1

## 2019-09-26 MED ORDER — OMEPRAZOLE 20 MG PO CPDR: 20.0000 mg | DELAYED_RELEASE_CAPSULE | Freq: Every day | ORAL | 0 refills | Status: DC

## 2019-09-26 MED ORDER — SODIUM CHLORIDE 0.9 % IV BOLUS: 500.0000 mL | Freq: Once | INTRAVENOUS | Status: DC

## 2019-09-26 MED ORDER — ONDANSETRON HCL 4 MG/2ML IJ SOLN
4.0000 mg | Freq: Once | INTRAMUSCULAR | Status: DC
  Filled 2019-09-26: qty 2

## 2019-09-26 MED ORDER — ALUM & MAG HYDROXIDE-SIMETH 200-200-20 MG/5ML PO SUSP
30.0000 mL | Freq: Once | ORAL | Status: AC
  Administered 2019-09-26: 30 mL via ORAL
  Filled 2019-09-26: qty 30

## 2019-09-26 MED ORDER — LIDOCAINE VISCOUS HCL 2 % MT SOLN
15.0000 mL | Freq: Once | OROMUCOSAL | Status: AC
  Administered 2019-09-26: 15 mL via ORAL
  Filled 2019-09-26: qty 15

## 2019-09-26 NOTE — ED Triage Notes (Signed)
Pt in w/generalized burning abdominal pain x 3 days. C/o n/v, 6-10 episodes/day. Reports heavy drinking since his brother passed - 3 fifth's and a Henessey a day. Also reports marijuana use "all day every day". Denies any blood in emesis or chest pain

## 2019-09-26 NOTE — ED Provider Notes (Signed)
[facility] EMERGENCY DEPARTMENT Provider Note   CSN: [id number] Arrival date & time: 09/26/19  0957     History Chief Complaint  Patient presents with  . Abdominal Pain  . Emesis    Andrew Page is a 24 y.o. male.  HPI   Patient presents to the emergency department with chief complaint of stomach pain that has been gone for last 3 days.  Patient believes the stomach pain is as result of drinking alcohol daily and thinks that alcohol has irritated his stomach.  He admits to frequent nausea and vomiting for the last 3 days.  He states nonbilious vomit seeing blood in it, denies bloody stools or diarrhea, fevers chills.  Patient had hernia repair in 2016, denies constipation or bloating.  Patient admitts to drinking a handle of Hennessy every other day for the last 2 years.  States his last drink was about 3 days ago denies anxiety, becoming diaphoretic, agitated when change in his mental status.  Patient has no significant medical history does not take any medication on a daily basis.  Patient denies headache, fever, chills, shortness of breath, chest pain, pedal edema.  Past Medical History:  Diagnosis Date  . Attention deficit hyperactivity disorder (ADHD)   . Boxers fracture 07/02/11   Cast applied in the ED. Removed by Ortho.  . Depression   . Inguinal hernia    RIGHT  . Oppositional defiant disorder 3/07   Borderline/ low average intelligence (Questionable)  . Tinea capitis     Patient Active Problem List   Diagnosis Date Noted  . 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 Alcohol abuse with alcohol-induced mood disorder (HCC) 11/23/2018  . Suicidal ideations   . Depression 07/22/2014  . Substance abuse (HCC) 07/22/2014  . Right hydrocele 07/22/2014  . Attention deficit hyperactivity disorder (ADHD) 04/18/2006    Past Surgical History:  Procedure Laterality Date  . HAND SURGERY    . HERNIA REPAIR      . INGUINAL HERNIA REPAIR Right 12/10/2014   Procedure: LAPAROSCOPIC RIGHT INGUINAL HERNIA REPAIR WITH MESH;  Surgeon: Axel Filler, MD;  Location: [facility];  Service: General;  Laterality: Right;  . INSERTION OF MESH Right 12/10/2014   Procedure: INSERTION OF MESH;  Surgeon: Axel Filler, MD;  Location: [facility];  Service: General;  Laterality: Right;       Family History  Problem Relation Age of Onset  . Eczema Other        elbows, behind the knees, worse in the summer. Previously treated with triamcinolonce 0.1% cream with sucess.  . Eczema Brother        Twin  . Hypertension Mother   . Diabetes Maternal Grandfather     Social History   Tobacco Use  . Smoking status: Current Every Day Smoker    Packs/day: 1.00    Years: 4.00    Pack years: 4.00  . Smokeless tobacco: Never Used  Vaping Use  . Vaping Use: Never used  Substance Use Topics  . Alcohol use: Yes    Comment: 3 fifth's/day  . Drug use: Not Currently    Types: Marijuana    Home Medications Prior to Admission medications   Medication Sig Start Date End Date Taking? Authorizing Provider  doxycycline (VIBRAMYCIN) 100 MG capsule Take 1 capsule (100 mg total) by mouth 2 (two) times daily. 09/16/19   Wallis Bamberg, PA-C  naproxen (NAPROSYN) 500 MG tablet Take 1 tablet (500 mg total) by mouth  2 (two) times daily with a meal. 09/16/19   Jaynee Eagles, PA-C    Allergies    Patient has no known allergies.  Review of Systems   Review of Systems  Constitutional: Negative for chills and fever.  HENT: Negative for congestion, sore throat and tinnitus.   Eyes: Negative for visual disturbance.  Respiratory: Negative for shortness of breath.   Cardiovascular: Negative for chest pain.  Gastrointestinal: Positive for abdominal pain, nausea and vomiting. Negative for diarrhea.  Genitourinary: Negative for enuresis, flank pain, frequency and scrotal swelling.  Musculoskeletal: Negative for back pain and joint swelling.  Skin:  Negative for rash.  Neurological: Negative for dizziness and headaches.  Hematological: Does not bruise/bleed easily.    Physical Exam Updated Vital Signs BP 120/86 (BP Location: Left Arm)   Pulse 68   Temp 98.3 F (36.8 C) (Oral)   Resp 14   SpO2 99%   Physical Exam Vitals and nursing note reviewed.  Constitutional:      General: He is not in acute distress.    Appearance: He is not ill-appearing.  HENT:     Head: Normocephalic and atraumatic.     Nose: No congestion.     Mouth/Throat:     Mouth: Mucous membranes are moist.     Pharynx: Oropharynx is clear.  Eyes:     General: No scleral icterus. Cardiovascular:     Rate and Rhythm: Normal rate and regular rhythm.     Pulses: Normal pulses.     Heart sounds: No murmur heard.  No friction rub. No gallop.   Pulmonary:     Effort: No respiratory distress.     Breath sounds: No wheezing, rhonchi or rales.  Abdominal:     General: There is no distension.     Tenderness: There is no abdominal tenderness. There is no right CVA tenderness, left CVA tenderness or guarding.     Comments: Abdominal exam was performed, nondistended, normoactive bowel sounds, dull to percussion, slight tenderness to palpation around his umbilicus, no Murphy sign, no rebound tenderness, no signs of acute abdomen noted  Musculoskeletal:        General: No swelling.     Right lower leg: No edema.     Left lower leg: No edema.  Skin:    General: Skin is warm and dry.     Capillary Refill: Capillary refill takes less than 2 seconds.     Findings: No rash.  Neurological:     Mental Status: He is alert and oriented to person, place, and time.  Psychiatric:        Mood and Affect: Mood normal.     ED Results / Procedures / Treatments   Labs (all labs ordered are listed, but only abnormal results are displayed) Labs Reviewed  COMPREHENSIVE METABOLIC PANEL - Abnormal; Notable for the following components:      Result Value   Glucose, Bld 109 (*)     AST 73 (*)    All other components within normal limits  LIPASE, BLOOD  CBC  URINALYSIS, ROUTINE W REFLEX MICROSCOPIC    EKG None  Radiology No results found.  Procedures Procedures (including critical care time)  Medications Ordered in ED Medications  sodium chloride flush (NS) 0.9 % injection 3 mL (has no administration in time range)  alum & mag hydroxide-simeth (MAALOX/MYLANTA) 200-200-20 MG/5ML suspension 30 mL (30 mLs Oral Given 09/26/19 1434)    And  lidocaine (XYLOCAINE) 2 % viscous mouth solution 15 mL (  15 mLs Oral Given 09/26/19 1434)  ondansetron (ZOFRAN-ODT) disintegrating tablet 4 mg (4 mg Oral Given 09/26/19 1433)    ED Course  I have reviewed the triage vital signs and the nursing notes.  Pertinent labs & imaging results that were available during my care of the patient were reviewed by me and considered in my medical decision making (see chart for details).    MDM Rules/Calculators/A&P                          I have personally reviewed all imaging, labs and have interpreted them.  Patient was alert and oriented on examination, did not appear to be in any acute distress, vital signs reassuring.  Exam showed patient had a slightly tenderness abdomen upon palpation, no signs of acute abdomen noted, lung sounds are clear bilaterally, no signs of hypoperfusion or pedal edema.  Will provide patient with antiemetics and GI cocktail and reassess.  Patient was reassessed after GI cocktail and states he is feeling much better, he is tolerating p.o. without difficulty.  I have low suspicion for bowel obstruction as abdomen was benign, he was tolerating p.o., he is passing gas as well as having normal bowel movements.  Low suspicion for acute abdomen as his exam was benign, nondistended, dull to percussion, no rebound tenderness, negative Rovsing or McBurney point.  Low suspicion for systemic infection as patient was nontoxic-appearing, vital signs reassuring, patient had  no leukocytosis on CBC.  Low suspicion for biliary disease or hepatic disease as CMP does not show elevated liver enzymes, alk phos, total bili.  Unlikely patient suffering from metabolic abnormality as CMp does not show electrolyte abnormalities, no signs of AKI.  Lipase was 26 making pancreatitis unlikely.  Due to well-appearing patient further lab work and imaging were not warranted.  Patient appears to be resting comfortably in bed show no acute signs stress.  Vital signs have remained stable does not meet criteria to be admitted to the hospital.  Likely patient suffering from gastritis secondary to alcohol use.  Recommend patient stops drinking alcohol, and stays on a bland diet as well as taking PPIs.  Patient was discussed with attending who agreed with assessment and plan.  Patient was given Haldol schedule strict return precautions.  Patient verbalized that he understood and agreed to plan. Final Clinical Impression(s) / ED Diagnoses Final diagnoses:  None    Rx / DC Orders ED Discharge Orders    None       Marcello Fennel, PA-C 09/26/19 1618    Maudie Flakes, MD 09/26/19 2055

## 2019-09-26 NOTE — Discharge Instructions (Addendum)
You have been seen here for nausea and vomiting.  lab work and physical exam look reassuring.  Likely you have gastritis due to alcohol consumption.  I prescribed you an acid pill please take as prescribed.  I recommend stop drinking alcohol as this can aggravate your stomach, stay away from spicy foods, carbonated drinks, dairy products and stick with a bland diet.  I provided you with information for community health and wellness they work with individuals with little to no insurance can help you find a primary care provider.  Please call them at your earliest convenience.  I want you to come back to emergency department if you develop severe abdominal pain, uncontrolled nausea, vomiting, diarrhea, chest pain, shortness of breath as the symptoms require further evaluation management.

## 2019-11-03 ENCOUNTER — Emergency Department (HOSPITAL_COMMUNITY)
Admission: EM | Admit: 2019-11-03 | Discharge: 2019-11-03 | Disposition: A | Discharge status: Home / Self Care | Payer: Self-pay | Attending: Emergency Medicine | Admitting: Emergency Medicine

## 2019-11-03 ENCOUNTER — Encounter (HOSPITAL_COMMUNITY): Payer: Self-pay | Admitting: Emergency Medicine

## 2019-11-03 ENCOUNTER — Emergency Department (HOSPITAL_COMMUNITY): Payer: Self-pay

## 2019-11-03 ENCOUNTER — Other Ambulatory Visit: Payer: Self-pay

## 2019-11-03 DIAGNOSIS — W228XXA Striking against or struck by other objects, initial encounter: Secondary | ICD-10-CM | POA: Insufficient documentation

## 2019-11-03 DIAGNOSIS — F17200 Nicotine dependence, unspecified, uncomplicated: Secondary | ICD-10-CM | POA: Insufficient documentation

## 2019-11-03 DIAGNOSIS — Y999 Unspecified external cause status: Secondary | ICD-10-CM | POA: Insufficient documentation

## 2019-11-03 DIAGNOSIS — Y9289 Other specified places as the place of occurrence of the external cause: Secondary | ICD-10-CM | POA: Insufficient documentation

## 2019-11-03 DIAGNOSIS — S60221A Contusion of right hand, initial encounter: Secondary | ICD-10-CM | POA: Insufficient documentation

## 2019-11-03 DIAGNOSIS — Y9389 Activity, other specified: Secondary | ICD-10-CM | POA: Insufficient documentation

## 2019-11-03 NOTE — Discharge Instructions (Addendum)
Your right hand x-ray is negative for fracture. It is very important to rest ice and elevate your hand. You may use Tylenol and ibuprofen as described below. Please stop punching things as this can severely injure or break the areas of your bones other at high risk for fracture as you have already broken them in the past.  Please use Tylenol or ibuprofen for pain.  You may use 600 mg ibuprofen every 6 hours or 1000 mg of Tylenol every 6 hours.  You may choose to alternate between the 2.  This would be most effective.  Not to exceed 4 g of Tylenol within 24 hours.  Not to exceed 3200 mg ibuprofen 24 hours.

## 2019-11-03 NOTE — ED Triage Notes (Signed)
Pt. Stated, I hit a pole last night and injured my rt. Hand. Pt. Stated Its broke I know.

## 2019-11-03 NOTE — ED Provider Notes (Signed)
[facility] EMERGENCY DEPARTMENT Provider Note   CSN: [id number] Arrival date & time: 11/03/19  1614     History Chief Complaint  Patient presents with   Hand Pain    Andrew Page is a 24 y.o. male.  HPI Patient is a 24 year old male presented today with right hand pain after he punched a metal pole yesterday. He states he did not punch a person and states that he has no abrasions or cuts over his knuckles. He states that he had sudden onset of achy right hand pain over the right pinky MCP and metacarpal but has been constant and achy since. He is taken no pain medications. He is not used ice or any NSAIDs. He denies any associated symptoms. He states that the pain has been constant not worsening or improving. No aggravating or mitigating factors other than pushing on his hand which makes it worse.  Denies any other injuries.    Past Medical History:  Diagnosis Date   Attention deficit hyperactivity disorder (ADHD)    Boxers fracture 07/02/11   Cast applied in the ED. Removed by Ortho.   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) 11/23/2018   Alcohol abuse with alcohol-induced mood disorder (HCC) 11/23/2018   Suicidal ideations    Depression 07/22/2014   Substance abuse (HCC) 07/22/2014   Right hydrocele 07/22/2014   Attention deficit hyperactivity disorder (ADHD) 04/18/2006    Past Surgical History:  Procedure Laterality Date   HAND SURGERY     HERNIA REPAIR     INGUINAL HERNIA REPAIR Right 12/10/2014   Procedure: LAPAROSCOPIC RIGHT INGUINAL HERNIA REPAIR WITH MESH;  Surgeon: Axel Filler, MD;  Location: [facility];  Service: General;  Laterality: Right;   INSERTION OF MESH Right 12/10/2014    Procedure: INSERTION OF MESH;  Surgeon: Axel Filler, MD;  Location: [facility];  Service: General;  Laterality: Right;       Family History  Problem Relation Age of Onset   Eczema Other        elbows, behind the knees, worse in the summer. Previously treated with triamcinolonce 0.1% cream with sucess.   Eczema Brother        Twin   Hypertension Mother    Diabetes Maternal Grandfather     Social History   Tobacco Use   Smoking status: Current Every Day Smoker    Packs/day: 1.00    Years: 4.00    Pack years: 4.00   Smokeless tobacco: Never Used  Building services engineer Use: Never used  Substance Use Topics   Alcohol use: Yes    Comment: 3 fifth's/day   Drug use: Not Currently    Types: Marijuana    Home Medications Prior to Admission medications   Medication Sig Start Date End Date Taking? Authorizing Provider  doxycycline (VIBRAMYCIN) 100 MG capsule Take 1 capsule (100 mg total) by mouth 2 (two) times daily. 09/16/19   Wallis Bamberg, PA-C  naproxen (NAPROSYN) 500 MG tablet Take 1 tablet (500 mg total) by mouth 2 (two) times daily with a meal. 09/16/19   Wallis Bamberg, PA-C  omeprazole (PRILOSEC) 20 MG capsule Take 1 capsule (20 mg total) by mouth daily for 21 days. 09/26/19 10/17/19  Carroll Sage,  PA-C  ondansetron (ZOFRAN) 4 MG tablet Take 1 tablet (4 mg total) by mouth every 8 (eight) hours as needed for nausea or vomiting. 09/26/19   Carroll Sage, PA-C    Allergies    Patient has no known allergies.  Review of Systems   Review of Systems  Constitutional: Negative for chills and fever.  HENT: Negative for congestion.   Gastrointestinal: Negative for abdominal pain.  Musculoskeletal: Negative for neck pain.       Right hand pain    Physical Exam Updated Vital Signs Ht 5\' 9"  (1.753 m)    Wt 66.7 kg    BMI 21.71 kg/m   Physical Exam Vitals and nursing note reviewed.  Constitutional:      General: He is not in acute distress.    Appearance: Normal  appearance. He is not ill-appearing.  HENT:     Head: Normocephalic and atraumatic.  Eyes:     General: No scleral icterus.       Right eye: No discharge.        Left eye: No discharge.     Conjunctiva/sclera: Conjunctivae normal.  Cardiovascular:     Rate and Rhythm: Normal rate.     Pulses: Normal pulses.     Comments: Good cap refill. Heart rate within normal limits. Pulmonary:     Effort: Pulmonary effort is normal.     Breath sounds: No stridor.  Musculoskeletal:     Comments: Mild tenderness to palpation over the right fifth metacarpal. There is some tenderness to palpation over the right MCP as well. Full range of motion of all digits. Mild swelling, no obvious deformity. Good cap refill. Good sensation in all fingertips.  Skin:    General: Skin is warm and dry.  Neurological:     Mental Status: He is alert and oriented to person, place, and time. Mental status is at baseline.     ED Results / Procedures / Treatments   Labs (all labs ordered are listed, but only abnormal results are displayed) Labs Reviewed - No data to display  EKG None  Radiology DG Hand Complete Right  Result Date: 11/03/2019 CLINICAL DATA:  Struck a pole and injured right hand. And pain in the fifth metacarpal with known prior fracture. EXAM: RIGHT HAND - COMPLETE 3+ VIEW COMPARISON:  Hand radiographs 09/21/2017, 07/03/2016. FINDINGS: Remote posttraumatic deformity of the third and fifth metacarpals compatible with previously seen injuries comparison radiography. No acute bony abnormality. Specifically, no fracture, subluxation, or dislocation. Minimal soft tissue swelling across the dorsal aspect of the hand. No soft tissue gas or foreign body. IMPRESSION: 1. Minimal soft tissue swelling across the dorsal aspect of the hand. 2. No acute osseous abnormality. 3. Remote healed fracture deformities of the third and fifth metacarpal. Electronically Signed   By: 07/05/2016 M.D.   On: 11/03/2019 16:49     Procedures Procedures (including critical care time)  Medications Ordered in ED Medications - No data to display  ED Course  I have reviewed the triage vital signs and the nursing notes.  Pertinent labs & imaging results that were available during my care of the patient were reviewed by me and considered in my medical decision making (see chart for details).    MDM Rules/Calculators/A&P                          24 year old male with history of right third and fifth metacarpal fracture presented today after  he punched a metal pole. He states this is how he broke his hand last time.  Physical exam is notable for tenderness to palpation but no step-off or deformity of any of the metacarpals of the hand.  No fight bite or laceration or abrasions to the knuckles.  IMPRESSION:  1. Minimal soft tissue swelling across the dorsal aspect of the  hand.  2. No acute osseous abnormality.  3. Remote healed fracture deformities of the third and fifth  metacarpal.     Patient given return precautions with PCP.  Vital signs within normal limits -- give Tylenol and ibuprofen recommendations.  Final Clinical Impression(s) / ED Diagnoses Final diagnoses:  Contusion of right hand, initial encounter    Rx / DC Orders ED Discharge Orders    None       Gailen Shelter, [place] 11/04/19 1244    Eber Hong, MD 11/05/19 [phone]

## 2019-11-16 ENCOUNTER — Other Ambulatory Visit: Payer: Self-pay

## 2019-11-16 ENCOUNTER — Emergency Department (HOSPITAL_COMMUNITY): Payer: Self-pay

## 2019-11-16 ENCOUNTER — Encounter (HOSPITAL_COMMUNITY): Payer: Self-pay

## 2019-11-16 ENCOUNTER — Emergency Department (HOSPITAL_COMMUNITY)
Admission: EM | Admit: 2019-11-16 | Discharge: 2019-11-16 | Disposition: A | Discharge status: Home / Self Care | Payer: Self-pay | Attending: Emergency Medicine | Admitting: Emergency Medicine

## 2019-11-16 DIAGNOSIS — Y9289 Other specified places as the place of occurrence of the external cause: Secondary | ICD-10-CM | POA: Insufficient documentation

## 2019-11-16 DIAGNOSIS — F17210 Nicotine dependence, cigarettes, uncomplicated: Secondary | ICD-10-CM | POA: Insufficient documentation

## 2019-11-16 DIAGNOSIS — S61216A Laceration without foreign body of right little finger without damage to nail, initial encounter: Secondary | ICD-10-CM | POA: Insufficient documentation

## 2019-11-16 DIAGNOSIS — Y9301 Activity, walking, marching and hiking: Secondary | ICD-10-CM | POA: Insufficient documentation

## 2019-11-16 DIAGNOSIS — S60221A Contusion of right hand, initial encounter: Secondary | ICD-10-CM | POA: Insufficient documentation

## 2019-11-16 MED ORDER — AMOXICILLIN-POT CLAVULANATE 875-125 MG PO TABS: 1.0000 | ORAL_TABLET | Freq: Two times a day (BID) | ORAL | 0 refills | Status: DC

## 2019-11-16 NOTE — ED Notes (Signed)
Reviewed D/C with pt, reviewed Rx with pt, pt denies questions at this time. 
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
## 2020-06-18 ENCOUNTER — Emergency Department (HOSPITAL_COMMUNITY)
Admission: EM | Admit: 2020-06-18 | Discharge: 2020-06-18 | Disposition: A | Discharge status: Home / Self Care | Payer: Self-pay | Attending: Emergency Medicine | Admitting: Emergency Medicine

## 2020-06-18 DIAGNOSIS — R112 Nausea with vomiting, unspecified: Secondary | ICD-10-CM | POA: Insufficient documentation

## 2020-06-18 DIAGNOSIS — R1084 Generalized abdominal pain: Secondary | ICD-10-CM | POA: Insufficient documentation

## 2020-06-18 DIAGNOSIS — F17200 Nicotine dependence, unspecified, uncomplicated: Secondary | ICD-10-CM | POA: Insufficient documentation

## 2020-06-18 DIAGNOSIS — F1290 Cannabis use, unspecified, uncomplicated: Secondary | ICD-10-CM | POA: Insufficient documentation

## 2020-06-18 LAB — CBG MONITORING, ED
Glucose-Capillary: 67 mg/dL — ABNORMAL LOW (ref 70–99)
Glucose-Capillary: 129 mg/dL — ABNORMAL HIGH (ref 70–99)

## 2020-06-18 LAB — CBC WITH DIFFERENTIAL/PLATELET
Abs Immature Granulocytes: 0.02 10*3/uL (ref 0.00–0.07)
Basophils Absolute: 0.1 10*3/uL (ref 0.0–0.1)
Basophils Relative: 1 %
Eosinophils Absolute: 0.1 10*3/uL (ref 0.0–0.5)
Eosinophils Relative: 1 %
HCT: 46.2 % (ref 39.0–52.0)
Hemoglobin: 15.6 g/dL (ref 13.0–17.0)
Immature Granulocytes: 0 %
Lymphocytes Relative: 23 %
Lymphs Abs: 2.1 10*3/uL (ref 0.7–4.0)
MCH: 31.8 pg (ref 26.0–34.0)
MCHC: 33.8 g/dL (ref 30.0–36.0)
MCV: 94.1 fL (ref 80.0–100.0)
Monocytes Absolute: 0.6 10*3/uL (ref 0.1–1.0)
Monocytes Relative: 6 %
Neutro Abs: 6.5 10*3/uL (ref 1.7–7.7)
Neutrophils Relative %: 69 %
Platelets: 248 10*3/uL (ref 150–400)
RBC: 4.91 MIL/uL (ref 4.22–5.81)
RDW: 13.9 % (ref 11.5–15.5)
WBC: 9.3 10*3/uL (ref 4.0–10.5)
nRBC: 0 % (ref 0.0–0.2)

## 2020-06-18 LAB — COMPREHENSIVE METABOLIC PANEL
ALT: 31 U/L (ref 0–44)
AST: 52 U/L — ABNORMAL HIGH (ref 15–41)
Albumin: 5 g/dL (ref 3.5–5.0)
Alkaline Phosphatase: 86 U/L (ref 38–126)
Anion gap: 14 (ref 5–15)
BUN: 10 mg/dL (ref 6–20)
CO2: 22 mmol/L (ref 22–32)
Calcium: 9.5 mg/dL (ref 8.9–10.3)
Chloride: 101 mmol/L (ref 98–111)
Creatinine, Ser: 1.19 mg/dL (ref 0.61–1.24)
GFR, Estimated: >60 mL/min (ref >60)
Glucose, Bld: 69 mg/dL — ABNORMAL LOW (ref 70–99)
Potassium: 4.8 mmol/L (ref 3.5–5.1)
Sodium: 137 mmol/L (ref 135–145)
Total Bilirubin: 1.6 mg/dL — ABNORMAL HIGH (ref 0.3–1.2)
Total Protein: 8.6 g/dL — ABNORMAL HIGH (ref 6.5–8.1)

## 2020-06-18 LAB — URINALYSIS, ROUTINE W REFLEX MICROSCOPIC
Bilirubin Urine: NEGATIVE
Glucose, UA: NEGATIVE mg/dL
Hgb urine dipstick: NEGATIVE
Ketones, ur: 20 mg/dL — AB
Nitrite: NEGATIVE
Protein, ur: 100 mg/dL — AB
Specific Gravity, Urine: 1.03 (ref 1.005–1.030)
pH: 5 (ref 5.0–8.0)

## 2020-06-18 LAB — LIPASE, BLOOD: Lipase: 27 U/L (ref 11–51)

## 2020-06-18 MED ORDER — ONDANSETRON 4 MG PO TBDP: 4.0000 mg | ORAL_TABLET | Freq: Three times a day (TID) | ORAL | 0 refills | Status: DC | PRN

## 2020-06-18 MED ORDER — ONDANSETRON HCL 4 MG/2ML IJ SOLN
4.0000 mg | Freq: Once | INTRAMUSCULAR | Status: AC
  Administered 2020-06-18: 4 mg via INTRAVENOUS
  Filled 2020-06-18: qty 2

## 2020-06-18 MED ORDER — SODIUM CHLORIDE 0.9 % IV BOLUS
1000.0000 mL | Freq: Once | INTRAVENOUS | Status: AC
  Administered 2020-06-18: 1000 mL via INTRAVENOUS

## 2020-06-18 NOTE — Discharge Instructions (Addendum)
You came to the emergency department today to be evaluated for your nausea, vomiting, and abdominal pain.  Your lab work showed that you were dehydrated.  Your physical exam was very reassuring.    If your symptoms do not continue to improve please follow-up with your primary care provider.  Get help right away if: You have pain in your chest, neck, arm, or jaw. You feel extremely weak or you faint. You have persistent vomiting. You have vomit that is bright red or looks like black coffee grounds. You have bloody or black stools or stools that look like tar. You have a severe headache, a stiff neck, or both. You have severe pain, cramping, or bloating in your abdomen. You have difficulty breathing, or you are breathing very quickly. Your heart is beating very quickly. Your skin feels cold and clammy. You feel confused. You have signs of dehydration, such as: Dark urine, very little urine, or no urine. Cracked lips. Dry mouth. Sunken eyes. Sleepiness. Weakness.

## 2020-06-18 NOTE — ED Triage Notes (Signed)
Emergency Medicine Provider Triage Evaluation Note  Akon Reinoso , a 25 y.o. male  was evaluated in triage.  Pt complains of nausea and vomiting that started this morning. Admits to 4 episodes of non-bloody, non-bilious emesis today. No abdominal pain or fever/chills. Admits to smoking marijuana. Denies sick contacts and known COVID exposures.  Review of Systems  Positive: Nausea vomiting Negative: fever  Physical Exam  BP 123/89 (BP Location: Left Arm)   Pulse 66   Temp 97.9 F (36.6 C) (Oral)   Resp 18   Ht 5\' 9"  (1.753 m)   Wt 66.7 kg   SpO2 97%   BMI 21.71 kg/m  Gen:   Awake, no distress   HEENT:  Atraumatic  Resp:  Normal effort  Cardiac:  Normal rate  Abd:   Nondistended, nontender  MSK:   Moves extremities without difficulty  Neuro:  Speech clear   Medical Decision Making  Medically screening exam initiated at 5:42 PM.  Appropriate orders placed.  Naman Spychalski was informed that the remainder of the evaluation will be completed by another provider, this initial triage assessment does not replace that evaluation, and the importance of remaining in the ED until their evaluation is complete.  Clinical Impression  Nausea and vomiting. Labs ordered to rule out electrolyte abnormalities.    Forestine Chute, Mannie Stabile 06/18/20 1744

## 2020-06-18 NOTE — ED Triage Notes (Signed)
Patient reports woke up today with nausea/vomiting from 8 am to 12 pm. Was not sick yesterday. No one else in household sick. Patient states "I need an IV and I am nauseous". Denies pain
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
## 2020-06-18 NOTE — ED Notes (Signed)
Patient given apple juice, crackers, cheese. Per Theron Arista, [place], patient allowed to eat and drink.

## 2020-09-28 ENCOUNTER — Ambulatory Visit (HOSPITAL_COMMUNITY)
Admission: EM | Admit: 2020-09-28 | Discharge: 2020-09-28 | Disposition: A | Discharge status: Home / Self Care | Payer: Self-pay | Attending: Student | Admitting: Student

## 2020-09-28 ENCOUNTER — Other Ambulatory Visit: Payer: Self-pay

## 2020-09-28 ENCOUNTER — Encounter (HOSPITAL_COMMUNITY): Payer: Self-pay

## 2020-09-28 DIAGNOSIS — J069 Acute upper respiratory infection, unspecified: Secondary | ICD-10-CM | POA: Insufficient documentation

## 2020-09-28 DIAGNOSIS — Z1152 Encounter for screening for COVID-19: Secondary | ICD-10-CM | POA: Insufficient documentation

## 2020-09-28 LAB — SARS CORONAVIRUS 2 (TAT 6-24 HRS): SARS Coronavirus 2: NEGATIVE

## 2020-09-28 MED ORDER — PROMETHAZINE-DM 6.25-15 MG/5ML PO SYRP: 5.0000 mL | ORAL_SOLUTION | Freq: Four times a day (QID) | ORAL | 0 refills | Status: DC | PRN

## 2020-09-28 MED ORDER — FLUTICASONE PROPIONATE 50 MCG/ACT NA SUSP: 1.0000 | Freq: Every day | NASAL | 2 refills | Status: DC

## 2020-09-28 MED ORDER — BENZONATATE 100 MG PO CAPS: 100.0000 mg | ORAL_CAPSULE | Freq: Three times a day (TID) | ORAL | 0 refills | Status: DC

## 2020-09-28 NOTE — Discharge Instructions (Addendum)
-  Promethazine DM cough syrup for congestion/cough. This could make you drowsy, so take at night before bed. -Tessalon (Benzonatate) as needed for cough. Take one pill up to 3x daily (every 8 hours) -Flonase nasal spray 2x daily for about 7 days -If your COVID test is positive, this means that you must isolate at home for a total of 5 days, and then wear a mask at work for the next 5 days.  Nasal congestion can often linger after having COVID-19, this should gradually improve over the next week or so.

## 2020-09-28 NOTE — ED Provider Notes (Signed)
[facility]    CSN: [id number] Arrival date & time: 09/28/20  1036      History   Chief Complaint Chief Complaint  Patient presents with   Weakness    HPI Andrew Page is a 25 y.o. male presenting with generalized weakness, decreased taste/smell x4 days. Medical history inguinal hernia s/p repair, boxers fracture, ADHD, polysubstance abuse. Hacking nonproductive cough. Hasn't tried medications for symptoms. Hasn't taken covid test. Denies fevers/chills, n/v/d, shortness of breath, chest pain, facial pain, teeth pain, headaches, sore throat,swollen lymph nodes, ear pain.    HPI  Past Medical History:  Diagnosis Date   Attention deficit hyperactivity disorder (ADHD)    Boxers fracture 07/02/11   Cast applied in the ED. Removed by Ortho.   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) 11/23/2018   Alcohol abuse with alcohol-induced mood disorder (HCC) 11/23/2018   Suicidal ideations    Depression 07/22/2014   Substance abuse (HCC) 07/22/2014   Right hydrocele 07/22/2014   Attention deficit hyperactivity disorder (ADHD) 04/18/2006    Past Surgical History:  Procedure Laterality Date   HAND SURGERY     HERNIA REPAIR     INGUINAL HERNIA REPAIR Right 12/10/2014   Procedure: LAPAROSCOPIC RIGHT INGUINAL HERNIA REPAIR WITH MESH;  Surgeon: Axel Filler, MD;  Location: [facility];  Service: General;  Laterality: Right;   INSERTION OF MESH Right 12/10/2014   Procedure: INSERTION OF MESH;  Surgeon: Axel Filler, MD;  Location: [facility];  Service: General;  Laterality: Right;       Home Medications    Prior to Admission medications   Medication Sig Start Date End Date Taking? Authorizing Provider  benzonatate (TESSALON) 100 MG  capsule Take 1 capsule (100 mg total) by mouth every 8 (eight) hours. 09/28/20  Yes Rhys Martini, PA-C  fluticasone (FLONASE) 50 MCG/ACT nasal spray Place 1 spray into both nostrils daily. 09/28/20  Yes Rhys Martini, PA-C  promethazine-dextromethorphan (PROMETHAZINE-DM) 6.25-15 MG/5ML syrup Take 5 mLs by mouth 4 (four) times daily as needed for cough. 09/28/20  Yes Rhys Martini, PA-C  naproxen (NAPROSYN) 500 MG tablet Take 1 tablet (500 mg total) by mouth 2 (two) times daily with a meal. 09/16/19   Wallis Bamberg, PA-C  omeprazole (PRILOSEC) 20 MG capsule Take 1 capsule (20 mg total) by mouth daily for 21 days. 09/26/19 10/17/19  Carroll Sage, PA-C  ondansetron (ZOFRAN ODT) 4 MG disintegrating tablet Take 1 tablet (4 mg total) by mouth every 8 (eight) hours as needed for nausea or vomiting. 06/18/20   Haskel Schroeder, PA-C    Family History Family History  Problem Relation Age of Onset   Eczema Other        elbows, behind the knees, worse in the summer. Previously treated with triamcinolonce 0.1% cream with sucess.   Eczema Brother        Twin   Hypertension Mother    Diabetes Maternal Grandfather     Social History Social History   Tobacco Use   Smoking status: Every Day    Packs/day: 1.00    Years: 4.00    Pack years: 4.00    Types: Cigarettes   Smokeless tobacco: Never  Vaping Use   Vaping Use: Never  used  Substance Use Topics   Alcohol use: Yes    Comment: 3 fifth's/day   Drug use: Not Currently    Types: Marijuana     Allergies   Patient has no known allergies.   Review of Systems Review of Systems  Constitutional:  Negative for appetite change, chills and fever.  HENT:  Positive for congestion. Negative for ear pain, rhinorrhea, sinus pressure, sinus pain and sore throat.   Eyes:  Negative for redness and visual disturbance.  Respiratory:  Positive for cough. Negative for chest tightness, shortness of breath and wheezing.   Cardiovascular:  Negative  for chest pain and palpitations.  Gastrointestinal:  Negative for abdominal pain, constipation, diarrhea, nausea and vomiting.  Genitourinary:  Negative for dysuria, frequency and urgency.  Musculoskeletal:  Negative for myalgias.  Neurological:  Negative for dizziness, weakness and headaches.  Psychiatric/Behavioral:  Negative for confusion.   All other systems reviewed and are negative.   Physical Exam Triage Vital Signs ED Triage Vitals  Enc Vitals Group     BP 09/28/20 1152 129/85     Pulse Rate 09/28/20 1152 (!) 54     Resp 09/28/20 1152 18     Temp 09/28/20 1152 98.8 F (37.1 C)     Temp Source 09/28/20 1152 Oral     SpO2 09/28/20 1152 99 %     Weight --      Height --      Head Circumference --      Peak Flow --      Pain Score 09/28/20 1150 0     Pain Loc --      Pain Edu? --      Excl. in GC? --    No data found.  Updated Vital Signs BP 129/85 (BP Location: Right Arm)   Pulse (!) 54   Temp 98.8 F (37.1 C) (Oral)   Resp 18   SpO2 99%   Visual Acuity Right Eye Distance:   Left Eye Distance:   Bilateral Distance:    Right Eye Near:   Left Eye Near:    Bilateral Near:     Physical Exam Vitals reviewed.  Constitutional:      General: He is not in acute distress.    Appearance: Normal appearance. He is not ill-appearing.  HENT:     Head: Normocephalic and atraumatic.     Right Ear: Tympanic membrane, ear canal and external ear normal. No tenderness. No middle ear effusion. There is no impacted cerumen. Tympanic membrane is not perforated, erythematous, retracted or bulging.     Left Ear: Tympanic membrane, ear canal and external ear normal. No tenderness.  No middle ear effusion. There is no impacted cerumen. Tympanic membrane is not perforated, erythematous, retracted or bulging.     Nose: Nose normal. No congestion.     Mouth/Throat:     Mouth: Mucous membranes are moist.     Pharynx: Uvula midline. No oropharyngeal exudate or posterior oropharyngeal  erythema.  Eyes:     Extraocular Movements: Extraocular movements intact.     Pupils: Pupils are equal, round, and reactive to light.  Cardiovascular:     Rate and Rhythm: Normal rate and regular rhythm.     Heart sounds: Normal heart sounds.  Pulmonary:     Effort: Pulmonary effort is normal.     Breath sounds: Normal breath sounds. No decreased breath sounds, wheezing, rhonchi or rales.  Abdominal:     Palpations: Abdomen is soft.  Tenderness: There is no abdominal tenderness. There is no guarding or rebound.  Neurological:     General: No focal deficit present.     Mental Status: He is alert and oriented to person, place, and time.  Psychiatric:        Mood and Affect: Mood normal.        Behavior: Behavior normal.        Thought Content: Thought content normal.        Judgment: Judgment normal.     UC Treatments / Results  Labs (all labs ordered are listed, but only abnormal results are displayed) Labs Reviewed  SARS CORONAVIRUS 2 (TAT 6-24 HRS)    EKG   Radiology No results found.  Procedures Procedures (including critical care time)  Medications Ordered in UC Medications - No data to display  Initial Impression / Assessment and Plan / UC Course  I have reviewed the triage vital signs and the nursing notes.  Pertinent labs & imaging results that were available during my care of the patient were reviewed by me and considered in my medical decision making (see chart for details).     This patient is a very pleasant 25 y.o. year old male presenting with viral symptoms- suspect covid-19. Covid PCR sent. Today this pt is afebrile nontachycardic nontachypneic, oxygenating well on room air, no wheezes rhonchi or rales. Promethazine, tessalon, flonase. Work note provided. ED return precautions discussed. Patient verbalizes understanding and agreement.     Final Clinical Impressions(s) / UC Diagnoses   Final diagnoses:  Viral URI with cough  Encounter for  screening for COVID-19     Discharge Instructions      -Promethazine DM cough syrup for congestion/cough. This could make you drowsy, so take at night before bed. -Tessalon (Benzonatate) as needed for cough. Take one pill up to 3x daily (every 8 hours) -Flonase nasal spray 2x daily for about 7 days -If your COVID test is positive, this means that you must isolate at home for a total of 5 days, and then wear a mask at work for the next 5 days.  Nasal congestion can often linger after having COVID-19, this should gradually improve over the next week or so.     ED Prescriptions     Medication Sig Dispense Auth. Provider   fluticasone (FLONASE) 50 MCG/ACT nasal spray Place 1 spray into both nostrils daily. 15 mL Rhys Martini, PA-C   benzonatate (TESSALON) 100 MG capsule Take 1 capsule (100 mg total) by mouth every 8 (eight) hours. 21 capsule Rhys Martini, PA-C   promethazine-dextromethorphan (PROMETHAZINE-DM) 6.25-15 MG/5ML syrup Take 5 mLs by mouth 4 (four) times daily as needed for cough. 118 mL Rhys Martini, PA-C      PDMP not reviewed this encounter.   Rhys Martini, PA-C 09/28/20 1238

## 2020-09-28 NOTE — ED Triage Notes (Signed)
Pt reports feeling weak and can not smell x 4 days.

## 2020-11-12 ENCOUNTER — Emergency Department (HOSPITAL_COMMUNITY): Payer: Self-pay

## 2020-11-12 ENCOUNTER — Other Ambulatory Visit: Payer: Self-pay

## 2020-11-12 ENCOUNTER — Encounter (HOSPITAL_COMMUNITY): Payer: Self-pay | Admitting: Emergency Medicine

## 2020-11-12 ENCOUNTER — Emergency Department (HOSPITAL_COMMUNITY)
Admission: EM | Admit: 2020-11-12 | Discharge: 2020-11-12 | Disposition: A | Discharge status: Home / Self Care | Payer: Self-pay | Attending: Emergency Medicine | Admitting: Emergency Medicine

## 2020-11-12 DIAGNOSIS — K2921 Alcoholic gastritis with bleeding: Secondary | ICD-10-CM | POA: Insufficient documentation

## 2020-11-12 DIAGNOSIS — F17210 Nicotine dependence, cigarettes, uncomplicated: Secondary | ICD-10-CM | POA: Insufficient documentation

## 2020-11-12 DIAGNOSIS — F10188 Alcohol abuse with other alcohol-induced disorder: Secondary | ICD-10-CM | POA: Insufficient documentation

## 2020-11-12 DIAGNOSIS — R112 Nausea with vomiting, unspecified: Secondary | ICD-10-CM

## 2020-11-12 LAB — COMPREHENSIVE METABOLIC PANEL
ALT: 31 U/L (ref 0–44)
AST: 50 U/L — ABNORMAL HIGH (ref 15–41)
Albumin: 5.2 g/dL — ABNORMAL HIGH (ref 3.5–5.0)
Alkaline Phosphatase: 66 U/L (ref 38–126)
Anion gap: 18 — ABNORMAL HIGH (ref 5–15)
BUN: 15 mg/dL (ref 6–20)
CO2: 20 mmol/L — ABNORMAL LOW (ref 22–32)
Calcium: 9.9 mg/dL (ref 8.9–10.3)
Chloride: 103 mmol/L (ref 98–111)
Creatinine, Ser: 1.24 mg/dL (ref 0.61–1.24)
GFR, Estimated: >60 mL/min (ref >60)
Glucose, Bld: 110 mg/dL — ABNORMAL HIGH (ref 70–99)
Potassium: 3.5 mmol/L (ref 3.5–5.1)
Sodium: 141 mmol/L (ref 135–145)
Total Bilirubin: 1.4 mg/dL — ABNORMAL HIGH (ref 0.3–1.2)
Total Protein: 8.6 g/dL — ABNORMAL HIGH (ref 6.5–8.1)

## 2020-11-12 LAB — CBC
HCT: 46.7 % (ref 39.0–52.0)
Hemoglobin: 15.6 g/dL (ref 13.0–17.0)
MCH: 31.8 pg (ref 26.0–34.0)
MCHC: 33.4 g/dL (ref 30.0–36.0)
MCV: 95.1 fL (ref 80.0–100.0)
Platelets: 242 10*3/uL (ref 150–400)
RBC: 4.91 MIL/uL (ref 4.22–5.81)
RDW: 13.2 % (ref 11.5–15.5)
WBC: 10.5 10*3/uL (ref 4.0–10.5)
nRBC: 0 % (ref 0.0–0.2)

## 2020-11-12 LAB — LIPASE, BLOOD: Lipase: 25 U/L (ref 11–51)

## 2020-11-12 MED ORDER — PANTOPRAZOLE SODIUM 20 MG PO TBEC: 20.0000 mg | DELAYED_RELEASE_TABLET | Freq: Every day | ORAL | 0 refills | Status: DC

## 2020-11-12 MED ORDER — ONDANSETRON HCL 4 MG/2ML IJ SOLN
4.0000 mg | Freq: Once | INTRAMUSCULAR | Status: AC
  Administered 2020-11-12: 4 mg via INTRAVENOUS
  Filled 2020-11-12: qty 2

## 2020-11-12 MED ORDER — SODIUM CHLORIDE 0.9 % IV BOLUS
1000.0000 mL | Freq: Once | INTRAVENOUS | Status: AC
  Administered 2020-11-12: 1000 mL via INTRAVENOUS

## 2020-11-12 MED ORDER — ONDANSETRON 4 MG PO TBDP: 4.0000 mg | ORAL_TABLET | Freq: Three times a day (TID) | ORAL | 0 refills | Status: DC | PRN

## 2020-11-12 MED ORDER — PANTOPRAZOLE SODIUM 40 MG IV SOLR
40.0000 mg | Freq: Once | INTRAVENOUS | Status: AC
  Administered 2020-11-12: 40 mg via INTRAVENOUS
  Filled 2020-11-12: qty 40

## 2020-11-12 NOTE — ED Triage Notes (Signed)
25 yo male biba c/o hematemesis x 1 day. Per EmS pt stated that he did some heavy drinking last night and when he woke up this morning he had and episdoe of vomiting with blood. Per EMS pt had several episodes of emesis enroute. Pt is non febrile, pt denies abdominal pain, pt denies diarrhea at this time.     Vitals: Bp 110/82 Hr 88 Rr 16  Spo2 99 Cbg 139

## 2020-11-12 NOTE — Discharge Instructions (Addendum)
Follow-up with GI. Take Zofran as needed as prescribed for nausea and vomiting.  Take Protonix daily as prescribed to help protect the lining of your stomach.  Ultimately, if you stop drinking alcohol this will lead to improvement in your health.
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
## 2020-11-16 ENCOUNTER — Ambulatory Visit: Payer: Self-pay | Admitting: Gastroenterology

## 2020-11-16 ENCOUNTER — Encounter: Payer: Self-pay | Admitting: Gastroenterology

## 2020-11-16 VITALS — BP 110/64 | HR 64 | Ht 69.5 in | Wt 153.2 lb

## 2020-11-16 DIAGNOSIS — Z789 Other specified health status: Secondary | ICD-10-CM

## 2020-11-16 DIAGNOSIS — R1013 Epigastric pain: Secondary | ICD-10-CM

## 2020-11-16 DIAGNOSIS — Z7289 Other problems related to lifestyle: Secondary | ICD-10-CM

## 2020-11-16 DIAGNOSIS — K921 Melena: Secondary | ICD-10-CM

## 2020-11-16 DIAGNOSIS — R748 Abnormal levels of other serum enzymes: Secondary | ICD-10-CM

## 2020-11-16 DIAGNOSIS — K920 Hematemesis: Secondary | ICD-10-CM

## 2020-11-16 MED ORDER — PANTOPRAZOLE SODIUM 20 MG PO TBEC: 20.0000 mg | DELAYED_RELEASE_TABLET | Freq: Two times a day (BID) | ORAL | 1 refills | Status: DC

## 2020-11-16 NOTE — Patient Instructions (Addendum)
If you are age 23 or older, your body mass index should be between 23-30. Your Body mass index is 22.3 kg/m. If this is out of the aforementioned range listed, please consider follow up with your Primary Care Provider.  If you are age 37 or younger, your body mass index should be between 19-25. Your Body mass index is 22.3 kg/m. If this is out of the aformentioned range listed, please consider follow up with your Primary Care Provider.   __________________________________________________________  The [place] [facility] providers would like to encourage you to use [facility] to communicate with providers for non-urgent requests or questions.  Due to long hold times on the telephone, sending your provider a message by [facility] may be a faster and more efficient way to get a response.  Please allow 48 business hours for a response.  Please remember that this is for non-urgent requests.   You have been scheduled for an endoscopy. Please follow written instructions given to you at your visit today. If you use inhalers (even only as needed), please bring them with you on the day of your procedure.   Please go to the lab in 1 week for LFTs. Our lab is located in the basement of our building, located at [address]. They are open Monday through Friday from 7:30 am to 5:00 pm. You do not need an appointment.  Discontinue alcohol.  Avoid NSAIDs.  We have sent the following medications to your pharmacy for you to pick up at your convenience: Protonix 20 mg: Take twice daily   Thank you for entrusting me with your care and for choosing [facility], Dr. Ileene Patrick

## 2020-11-16 NOTE — Progress Notes (Signed)
HPI :  25 year old male with a history of alcohol use, marijuana use, history of inguinal hernia, referred by the emergency department following a visit there for hematemesis.  Patient states he has been having problems with vomiting when drinking alcohol.  He drinks 1/5 of liquor about 3 times per week.  States he drank heavily a few days ago and presented to the emergency department on September 24 after he had multiple episodes of vomiting dark/coffee-ground's concerning for blood.  He states he also had dark black stools, about 3 or 4 times during this episode.  He was seen in the emergency department and he had a hemoglobin.  He was started on Protonix 20 mg a day and Zofran as needed.  He has been taking he does feel little bit better.  He has not had any further vomiting or active bleeding that he has noted since that time.  He has not drink any alcohol since the ED visit.  He states he has been using alcohol to this volume since about 2019 or so.  He usually does not have any heartburn or abdominal pains at baseline.  He has had epigastric pain when he presented to the hospital with the vomiting.  He also has occasional nausea which persists.  He is never had a prior endoscopy.  He smokes about 2 packs a day of cigarettes and also smokes marijuana every day.  He is concerned about his alcohol use causing his stomach problems.  Mother present for the history today states that his great aunt had colon cancer but no other known family history of gastric or colon cancer.  He has been eating okay and tolerating his meals since the ED visit.  He has been trying to eat healthier and avoid greasy foods. He denies any recent NSAID use.  Of note his liver enzymes were elevated in the emergency room.  His AST was 50, ALT 31, T bili 1.4, 66.  He has had a few ED evaluations over the past 2 years which have shown an AST elevation in the setting of alcohol use.    Past Medical History:  Diagnosis Date    Attention deficit hyperactivity disorder (ADHD)    Boxers fracture 07/02/11   Cast applied in the ED. Removed by Ortho.   Depression    Inguinal hernia    RIGHT   Oppositional defiant disorder 3/07   Borderline/ low average intelligence (Questionable)   Tinea capitis      Past Surgical History:  Procedure Laterality Date   HAND SURGERY     HERNIA REPAIR     INGUINAL HERNIA REPAIR Right 12/10/2014   Procedure: LAPAROSCOPIC RIGHT INGUINAL HERNIA REPAIR WITH MESH;  Surgeon: Axel Filler, MD;  Location: [facility];  Service: General;  Laterality: Right;   INSERTION OF MESH Right 12/10/2014   Procedure: INSERTION OF MESH;  Surgeon: Axel Filler, MD;  Location: [facility];  Service: General;  Laterality: Right;   Family History  Problem Relation Age of Onset   Hypertension Mother    Eczema Brother        Twin   Diabetes Maternal Grandfather    Congestive Heart Failure Maternal Grandfather    Diabetes Paternal Grandfather    Eczema Other        elbows, behind the knees, worse in the summer. Previously treated with triamcinolonce 0.1% cream with sucess.   Social History   Tobacco Use   Smoking status: Every Day    Packs/day: 1.00  Years: 4.00    Pack years: 4.00    Types: Cigarettes   Smokeless tobacco: Never  Vaping Use   Vaping Use: Some days  Substance Use Topics   Alcohol use: Yes    Alcohol/week: 112.0 standard drinks    Types: 112 Shots of liquor per week    Comment: 1 bottle a day   Drug use: Yes    Types: Marijuana   Current Outpatient Medications  Medication Sig Dispense Refill   ondansetron (ZOFRAN ODT) 4 MG disintegrating tablet Take 1 tablet (4 mg total) by mouth every 8 (eight) hours as needed for nausea or vomiting. 12 tablet 0   pantoprazole (PROTONIX) 20 MG tablet Take 1 tablet (20 mg total) by mouth daily. 30 tablet 0   No current facility-administered medications for this visit.   No Known Allergies   Review of Systems: All systems reviewed and  negative except where noted in HPI.    DG Abd Acute W/Chest  Result Date: 11/12/2020 CLINICAL DATA:  Hematemesis EXAM: DG ABDOMEN ACUTE WITH 1 VIEW CHEST COMPARISON:  January 07, 2010 FINDINGS: There is no evidence of dilated bowel loops or free intraperitoneal air. No radiopaque calculi or other significant radiographic abnormality is seen. Surgical changes project over the pelvis. Heart size and mediastinal contours are within normal limits. Both lungs are clear. IMPRESSION: Negative abdominal radiographs.  No acute cardiopulmonary disease. Electronically Signed   By: Meda Klinefelter M.D.   On: 11/12/2020 12:22    Lab Results  Component Value Date   WBC 10.5 11/12/2020   HGB 15.6 11/12/2020   HCT 46.7 11/12/2020   MCV 95.1 11/12/2020   PLT 242 11/12/2020    Lab Results  Component Value Date   CREATININE 1.24 11/12/2020   BUN 15 11/12/2020   NA 141 11/12/2020   K 3.5 11/12/2020   CL 103 11/12/2020   CO2 20 (L) 11/12/2020     Lab Results  Component Value Date   ALT 31 11/12/2020   AST 50 (H) 11/12/2020   ALKPHOS 66 11/12/2020   BILITOT 1.4 (H) 11/12/2020      Physical Exam: BP 110/64   Pulse 64   Ht 5' 9.5" (1.765 m)   Wt 153 lb 3.2 oz (69.5 kg)   BMI 22.30 kg/m  Constitutional: Pleasant,well-developed, male in no acute distress. HEENT: Normocephalic and atraumatic. Conjunctivae are normal. No scleral icterus. Neck supple.  Cardiovascular: Normal rate, regular rhythm.  Pulmonary/chest: Effort normal and breath sounds normal.  Abdominal: Soft, nondistended, nontender.  There are no masses palpable.  Extremities: no edema Lymphadenopathy: No cervical adenopathy noted. Neurological: Alert and oriented to person place and time. Skin: Skin is warm and dry. No rashes noted. Psychiatric: Normal mood and affect. Behavior is normal.   ASSESSMENT AND PLAN: 25 year old male here for new patient assessment of the following:  Hematemesis Melena Epigastric  pain Alcohol use Elevated liver enzymes  Severe nausea vomiting with hematemesis and melena / epigastric pain in the setting of significant alcohol use leading to recent ED visit.  Hemoglobin was normal.  LAE's with a mild AST elevation consistent with alcohol use.  He was placed on PPI and stopped drinking and is feeling better lately.  Discussed differential diagnosis.  Quite possible he has gastritis/esophagitis from alcohol use or PUD.  Given his overt bleeding symptoms, I am recommending an upper endoscopy to further evaluate.  I discussed what endoscopy is with him, risks / benefits of the exam and anesthesia and he wants  to proceed.  He is self-pay and was given cost of the procedure up front, and he wishes to proceed with the EGD.  He was scheduled to have this done within the next 5 days.  In the interim recommend he increase his Protonix to 20 mg twice daily and continue to abstain from all alcohol.  He should continue to avoid NSAIDs.  He can use Zofran as needed for nausea.  Long-term, I discussed risks of heavy alcohol use, and given he has these symptoms when he drinks he should abstain from alcohol long-term.  I did discuss his liver enzyme elevation and recommend he recheck this in a few weeks when he is not using any alcohol to see if this is persistent.  Discussed long-term risks of cirrhosis with heavy alcohol use, he and mother agree he should minimize use.  If he has any recurrence of bleeding in the interim he should contact me while awaiting endoscopy.  Hopefully with time and absence of alcohol he continues to feel better.  Plan: - EGD, scheduled to be done in the next few days - increase Protonix to 20mg  BID for 1 month  - NO NSAIDs - stop all alcohol - repeat LFTs in 1-2 weeks  , MD [facility]  CC: Harlin Rain, DO

## 2020-11-21 ENCOUNTER — Ambulatory Visit (AMBULATORY_SURGERY_CENTER): Payer: Self-pay | Admitting: Gastroenterology

## 2020-11-21 ENCOUNTER — Encounter: Payer: Self-pay | Admitting: Gastroenterology

## 2020-11-21 ENCOUNTER — Other Ambulatory Visit: Payer: Self-pay

## 2020-11-21 VITALS — BP 125/86 | HR 95 | Temp 99.1°F | Resp 12 | Ht 69.5 in | Wt 153.0 lb

## 2020-11-21 DIAGNOSIS — K921 Melena: Secondary | ICD-10-CM

## 2020-11-21 DIAGNOSIS — R1013 Epigastric pain: Secondary | ICD-10-CM

## 2020-11-21 DIAGNOSIS — K920 Hematemesis: Secondary | ICD-10-CM

## 2020-11-21 DIAGNOSIS — K319 Disease of stomach and duodenum, unspecified: Secondary | ICD-10-CM

## 2020-11-21 MED ORDER — ONDANSETRON HCL 4 MG PO TABS: 4.0000 mg | ORAL_TABLET | Freq: Three times a day (TID) | ORAL | 1 refills | Status: DC | PRN

## 2020-11-21 MED ORDER — SODIUM CHLORIDE 0.9 % IV SOLN: 500.0000 mL | Freq: Once | INTRAVENOUS | Status: DC

## 2020-11-21 NOTE — Progress Notes (Signed)
VS completed by DT.    Medical history reviewed and updated.  

## 2020-11-21 NOTE — Progress Notes (Signed)
History and Physical Interval Note: Patient seen in the office last week on 11/16/20. He is here for EGD to further evaluate history of nausea / vomiting, symptoms of coffee ground emesis / melena, epigastric pain. On protonix 20mg  BID and avoiding alcohol since last visit and he is feeling better. Some mild epigastric pain today. Otherwise without complaints. No changes in his status otherwise since clinic visit.  He wishes to proceed with EGD.  11/21/2020 3:54 PM  01/21/2021  has presented today for endoscopic procedure(s), with the diagnosis of  Encounter Diagnoses  Name Primary?   Hematemesis with nausea Yes   Melena    Abdominal pain, epigastric   .  The various methods of evaluation and treatment have been discussed with the patient and/or family. After consideration of risks, benefits and other options for treatment, the patient has consented to  the endoscopic procedure(s).   The patient's history has been reviewed, patient examined, no change in status, stable for surgery.  I have reviewed the patient's chart and labs.  Questions were answered to the patient's satisfaction.    Forestine Chute, MD [facility]

## 2020-11-21 NOTE — Progress Notes (Signed)
Called to room to assist during endoscopic procedure.  Patient ID and intended procedure confirmed with present staff. Received instructions for my participation in the procedure from the performing physician.  

## 2020-11-21 NOTE — Patient Instructions (Addendum)
YOU HAD AN ENDOSCOPIC PROCEDURE TODAY AT THE [facility]:   Refer to the procedure report that was given to you for any specific questions about what was found during the examination.  If the procedure report does not answer your questions, please call your gastroenterologist to clarify.  If you requested that your care partner not be given the details of your procedure findings, then the procedure report has been included in a sealed envelope for you to review at your convenience later.  YOU SHOULD EXPECT: Some feelings of bloating in the abdomen. Passage of more gas than usual.  Walking can help get rid of the air that was put into your GI tract during the procedure and reduce the bloating. If you had a lower endoscopy (such as a colonoscopy or flexible sigmoidoscopy) you may notice spotting of blood in your stool or on the toilet paper. If you underwent a bowel prep for your procedure, you may not have a normal bowel movement for a few days.  Please Note:  You might notice some irritation and congestion in your nose or some drainage.  This is from the oxygen used during your procedure.  There is no need for concern and it should clear up in a day or so.  SYMPTOMS TO REPORT IMMEDIATELY:   Following upper endoscopy (EGD)  Vomiting of blood or coffee ground material  New chest pain or pain under the shoulder blades  Painful or persistently difficult swallowing  New shortness of breath  Fever of 100F or higher  Black, tarry-looking stools  For urgent or emergent issues, a gastroenterologist can be reached at any hour by calling [phone]. Do not use MyChart messaging for urgent concerns.    DIET:  We do recommend a small meal at first, but then you may proceed to your regular diet.  Drink plenty of fluids but you should avoid alcoholic beverages for 24 hours.  ACTIVITY:  You should plan to take it easy for the rest of today and you should NOT DRIVE or use heavy machinery  until tomorrow (because of the sedation medicines used during the test).    FOLLOW UP: Our staff will call the number listed on your records 48-72 hours following your procedure to check on you and address any questions or concerns that you may have regarding the information given to you following your procedure. If we do not reach you, we will leave a message.  We will attempt to reach you two times.  During this call, we will ask if you have developed any symptoms of COVID 19. If you develop any symptoms (ie: fever, flu-like symptoms, shortness of breath, cough etc.) before then, please call [phone].  If you test positive for Covid 19 in the 2 weeks post procedure, please call and report this information to [place].    If any biopsies were taken you will be contacted by phone or by letter within the next 1-3 weeks.  Please call us at [phone] if you have not heard about the biopsies in 3 weeks.    SIGNATURES/CONFIDENTIALITY: You and/or your care partner have signed paperwork which will be entered into your electronic medical record.  These signatures attest to the fact that that the information above on your After Visit Summary has been reviewed and is understood.  Full responsibility of the confidentiality of this discharge information lies with you and/or your care-partner.    Resume medications,reduce protonix to once daily.

## 2020-11-21 NOTE — Progress Notes (Signed)
Verbal order for zofran 4mg  every 8 hrs dispense 20 with one refill.

## 2020-11-21 NOTE — Op Note (Signed)
[facility] Patient Name: Andrew Page Procedure Date: 11/21/2020 3:55 PM MRN: [id number] Endoscopist: Viviann Spare P. Adela Lank , MD Age: 25 Referring MD:  Date of Birth: [birth year] Gender: Male Account #: [id number] Procedure:                Upper GI endoscopy Indications:              Epigastric abdominal pain, Coffee-ground emesis,                            Melena in the setting of alcohol use - improved                            with stopping alcohol, protonix, and antiemetics as                            needed Medicines:                Monitored Anesthesia Care Procedure:                Pre-Anesthesia Assessment:                           - Prior to the procedure, a History and Physical                            was performed, and patient medications and                            allergies were reviewed. The patient's tolerance of                            previous anesthesia was also reviewed. The risks                            and benefits of the procedure and the sedation                            options and risks were discussed with the patient.                            All questions were answered, and informed consent                            was obtained. Prior Anticoagulants: The patient has                            taken no previous anticoagulant or antiplatelet                            agents. ASA Grade Assessment: II - A patient with                            mild systemic disease. After reviewing the risks  and benefits, the patient was deemed in                            satisfactory condition to undergo the procedure.                           After obtaining informed consent, the endoscope was                            passed under direct vision. Throughout the                            procedure, the patient's blood pressure, pulse, and                            oxygen saturations were monitored  continuously. The                            Endoscope was introduced through the mouth, and                            advanced to the second part of duodenum. The upper                            GI endoscopy was accomplished without difficulty.                            The patient tolerated the procedure well. Scope In: Scope Out: Findings:                 Esophagogastric landmarks were identified: the                            Z-line was found at 39 cm, the gastroesophageal                            junction was found at 39 cm and the upper extent of                            the gastric folds was found at 39 cm from the                            incisors.                           The exam of the esophagus was otherwise normal.                           The entire examined stomach was normal. Biopsies                            were taken from the antrum, body, incisura with a  cold forceps for Helicobacter pylori testing.                           The duodenal bulb and second portion of the                            duodenum were normal. Complications:            No immediate complications. Estimated blood loss:                            Minimal. Estimated Blood Loss:     Estimated blood loss was minimal. Impression:               - Esophagogastric landmarks identified.                           - Normal esophagus otherwise                           - Normal stomach. Biopsied to rule out H pylori.                           - Normal duodenal bulb and second portion of the                            duodenum.                           No overt cause for bleeding on this exam. Possible                            the patient could have previously had esophagitis /                            gastritis in the setting of alcohol use that has                            since healed, or Mallory weiss tear that caused                            bleeding in  the setting of vomiting that has since                            healed Recommendation:           - Patient has a contact number available for                            emergencies. The signs and symptoms of potential                            delayed complications were discussed with the                            patient. Return to normal activities tomorrow.  Written discharge instructions were provided to the                            patient.                           - Resume previous diet.                           - Continue present medications.                           - Okay to reduce protonix to once daily as needed                           - Continue Zofran as needed for nausea                           - Continue to avoid alcohol                           - Await pathology results with further                            recommendations. Viviann Spare P. Amulya Quintin, MD 11/21/2020 4:11:19 PM This report has been signed electronically.

## 2020-11-21 NOTE — Progress Notes (Signed)
Sedate, gd SR, tolerated procedure well, VSS, report to RN 

## 2020-11-23 ENCOUNTER — Telehealth: Payer: Self-pay

## 2020-11-23 NOTE — Telephone Encounter (Signed)
Left message on answering machine. 
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
## 2021-07-25 ENCOUNTER — Encounter: Payer: Self-pay | Admitting: *Deleted

## 2021-08-11 ENCOUNTER — Encounter (HOSPITAL_BASED_OUTPATIENT_CLINIC_OR_DEPARTMENT_OTHER): Payer: Self-pay

## 2021-08-11 ENCOUNTER — Other Ambulatory Visit: Payer: Self-pay

## 2021-08-11 ENCOUNTER — Emergency Department (HOSPITAL_BASED_OUTPATIENT_CLINIC_OR_DEPARTMENT_OTHER)
Admission: EM | Admit: 2021-08-11 | Discharge: 2021-08-11 | Disposition: A | Discharge status: Home / Self Care | Payer: Self-pay | Attending: Emergency Medicine | Admitting: Emergency Medicine

## 2021-08-11 DIAGNOSIS — L02215 Cutaneous abscess of perineum: Secondary | ICD-10-CM | POA: Insufficient documentation

## 2021-08-11 LAB — URINALYSIS, ROUTINE W REFLEX MICROSCOPIC
Bilirubin Urine: NEGATIVE
Glucose, UA: NEGATIVE mg/dL
Hgb urine dipstick: NEGATIVE
Ketones, ur: 15 mg/dL — AB
Leukocytes,Ua: NEGATIVE
Nitrite: NEGATIVE
Protein, ur: 30 mg/dL — AB
Specific Gravity, Urine: 1.033 — ABNORMAL HIGH (ref 1.005–1.030)
pH: 6 (ref 5.0–8.0)

## 2021-08-11 MED ORDER — DOXYCYCLINE HYCLATE 100 MG PO CAPS: 100.0000 mg | ORAL_CAPSULE | Freq: Two times a day (BID) | ORAL | 0 refills | Status: DC

## 2021-08-11 NOTE — ED Triage Notes (Signed)
Patient here POV from Home.  Endorses having a "Knot on his Gooch". Noted it 2 Days PTA and states it has remained Constant.   No Fevers. No Drainage. Tender to Touch. No Urinary Symptoms.   NAD Noted during Triage. A&Ox4. GCS 15. Ambulatory.

## 2021-08-11 NOTE — ED Provider Notes (Signed)
[facility] EMERGENC[facility] DEPT Provider Note   CSN: [id number] Arrival date & time: 08/11/21  1441     History  Chief Complaint  Patient presents with   Groin Swelling    Andrew Page is a 26 y.o. male with medical history significant for ADHD, inguinal hernia.  Patient presents to ED for evaluation of groin swelling.  Patient reports the last 2 days he has had a swelling mass located in his perennial region.  Patient denies history of same.  Patient denies dysuria, fevers, bodyaches or chills, nausea, vomiting.  HPI     Home Medications Prior to Admission medications   Medication Sig Start Date End Date Taking? Authorizing Provider  doxycycline (VIBRAMYCIN) 100 MG capsule Take 1 capsule (100 mg total) by mouth 2 (two) times daily. 08/11/21  Yes Al Decant, PA-C  ondansetron (ZOFRAN ODT) 4 MG disintegrating tablet Take 1 tablet (4 mg total) by mouth every 8 (eight) hours as needed for nausea or vomiting. 11/12/20   Army Melia A, PA-C  ondansetron (ZOFRAN) 4 MG tablet Take 1 tablet (4 mg total) by mouth every 8 (eight) hours as needed for nausea or vomiting. 11/21/20   Armbruster, Willaim Rayas, MD  pantoprazole (PROTONIX) 20 MG tablet Take 1 tablet (20 mg total) by mouth 2 (two) times daily. Note: Increase from once a day to TWICE a day 11/16/20 01/15/21  Armbruster, Willaim Rayas, MD      Allergies    Patient has no known allergies.    Review of Systems   Review of Systems  Constitutional:  Negative for chills and fever.  Gastrointestinal:  Negative for abdominal pain, diarrhea, nausea and vomiting.  Genitourinary:  Negative for dysuria.  Skin:        Abscess  All other systems reviewed and are negative.   Physical Exam Updated Vital Signs BP 122/89 (BP Location: Right Arm)   Pulse 97   Temp 98.3 F (36.8 C)   Resp 16   Ht 5' 9.5" (1.765 m)   Wt 69.4 kg   SpO2 100%   BMI 22.27 kg/m  Physical Exam Vitals and nursing note reviewed.  Constitutional:       General: He is not in acute distress.    Appearance: Normal appearance. He is not ill-appearing, toxic-appearing or diaphoretic.  HENT:     Head: Normocephalic and atraumatic.     Nose: Nose normal. No congestion.     Mouth/Throat:     Mouth: Mucous membranes are moist.     Pharynx: Oropharynx is clear.  Eyes:     Extraocular Movements: Extraocular movements intact.     Conjunctiva/sclera: Conjunctivae normal.     Pupils: Pupils are equal, round, and reactive to light.  Cardiovascular:     Rate and Rhythm: Normal rate and regular rhythm.  Pulmonary:     Effort: Pulmonary effort is normal.     Breath sounds: Normal breath sounds. No wheezing.  Abdominal:     General: Abdomen is flat. Bowel sounds are normal.     Palpations: Abdomen is soft.     Tenderness: There is no abdominal tenderness.  Genitourinary:   Musculoskeletal:     Cervical back: Normal range of motion and neck supple. No tenderness.  Skin:    General: Skin is warm and dry.     Capillary Refill: Capillary refill takes less than 2 seconds.  Neurological:     Mental Status: He is alert and oriented to person, place, and time.  ED Results / Procedures / Treatments   Labs (all labs ordered are listed, but only abnormal results are displayed) Labs Reviewed  URINALYSIS, ROUTINE W REFLEX MICROSCOPIC - Abnormal; Notable for the following components:      Result Value   Specific Gravity, Urine 1.033 (*)    Ketones, ur 15 (*)    Protein, ur 30 (*)    All other components within normal limits    EKG None  Radiology No results found.  Procedures Procedures   Medications Ordered in ED Medications - No data to display  ED Course/ Medical Decision Making/ A&P                           Medical Decision Making Amount and/or Complexity of Data Reviewed Labs: ordered.   48 oh male presents to ED for evaluation.  Please see HPI for further details.  On examination, patient has a 2 cm fluctuant mass  to perineum.  There is no surrounding erythema, drainage, induration.  Incision and drainage, imaging was offered to the patient however the patient states that he does not wish to proceed with this at this time.  The patient states that he wants to attempt antibiotics prior to incision and drainage treatment.  Patient be placed on 10 days of doxycycline twice daily.  The patient has been advised to return for fevers, nausea or vomiting, body aches or chills, increased size of mass.  Patient voices understanding of these return precautions.  Patient has had all of his questions answered to his satisfaction.  The patient is stable this time for discharge home  Final Clinical Impression(s) / ED Diagnoses Final diagnoses:  Abscess of perineum    Rx / DC Orders ED Discharge Orders          Ordered    doxycycline (VIBRAMYCIN) 100 MG capsule  2 times daily        08/11/21 1551              Al Decant, PA-C 08/11/21 1555    Sloan Leiter, DO 08/17/21 [phone]

## 2021-12-27 IMAGING — CR DG ABDOMEN ACUTE W/ 1V CHEST
3 series · 3 of 3 positions shown · non-contrast
Comparison: January 07, 2010

CLINICAL DATA: Hematemesis

EXAM:
DG ABDOMEN ACUTE WITH 1 VIEW CHEST

[w chest pa]
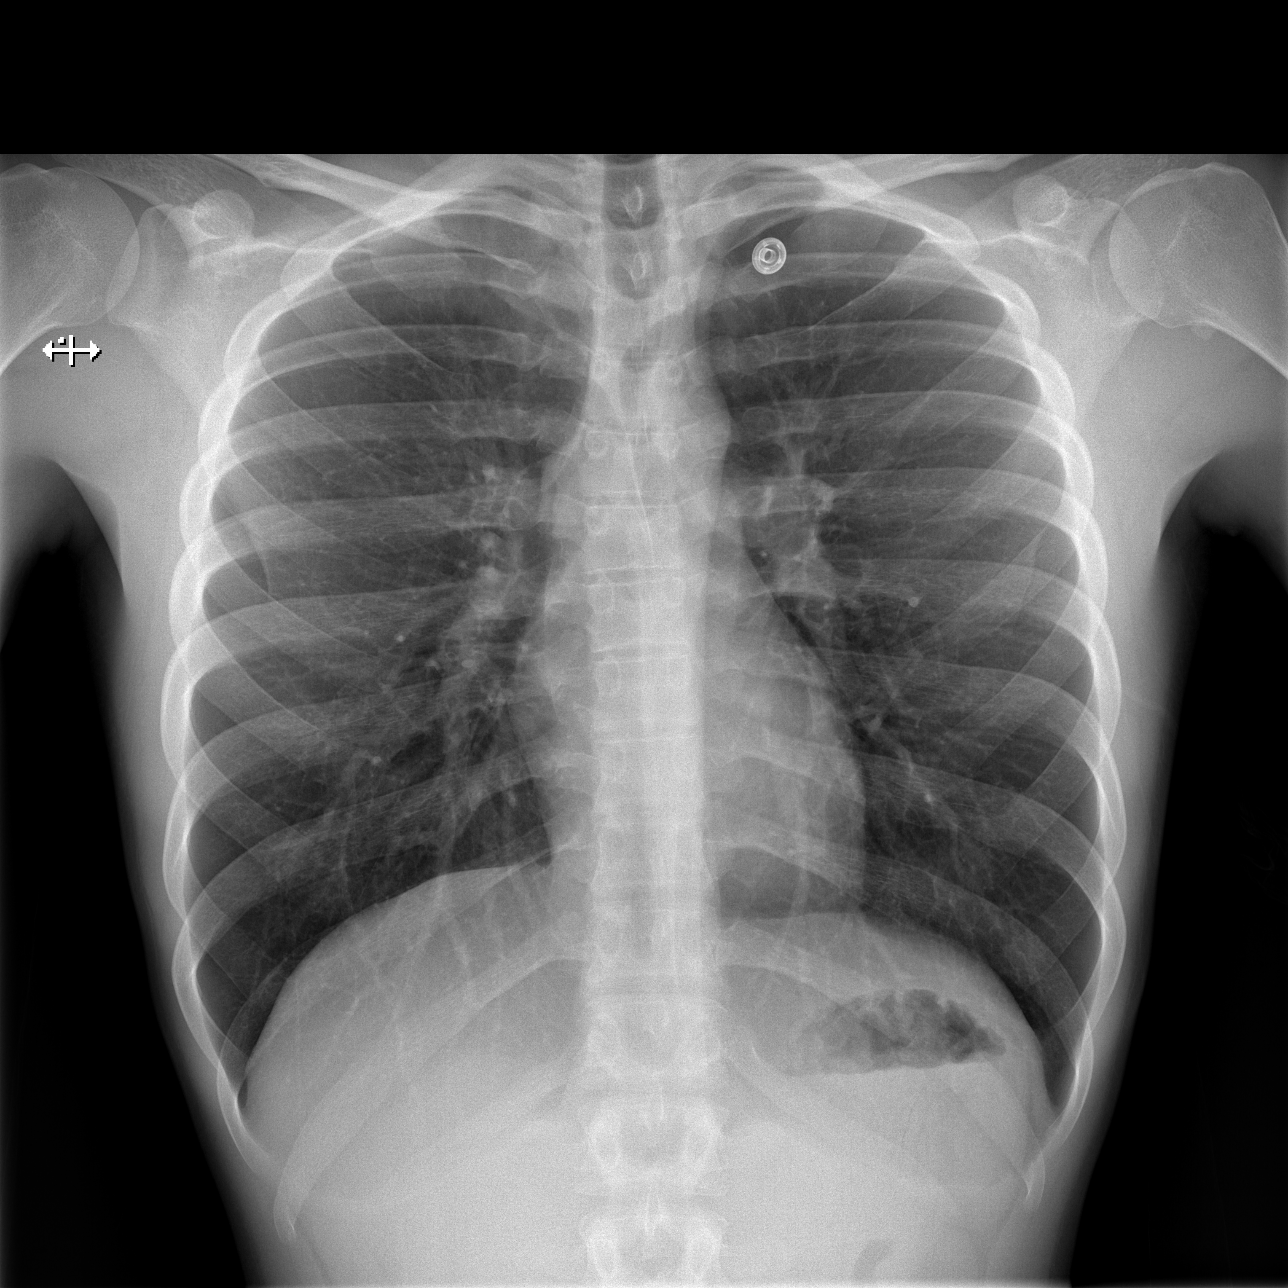

[w abdomen upright]
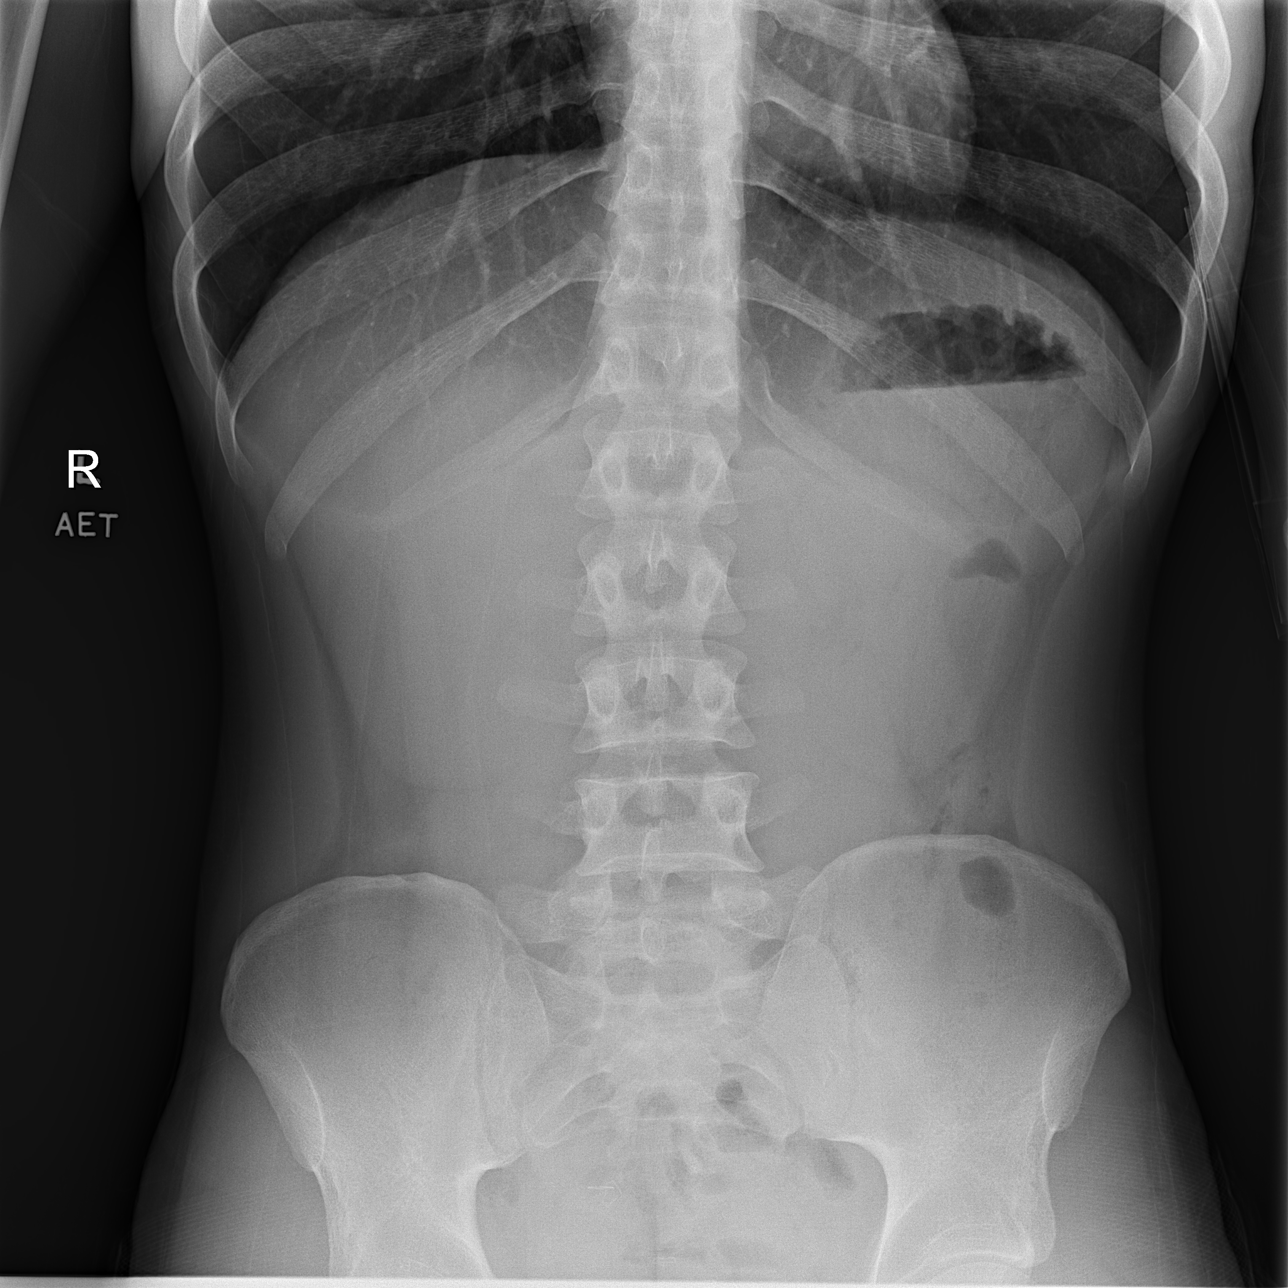

[t abdomen supine]
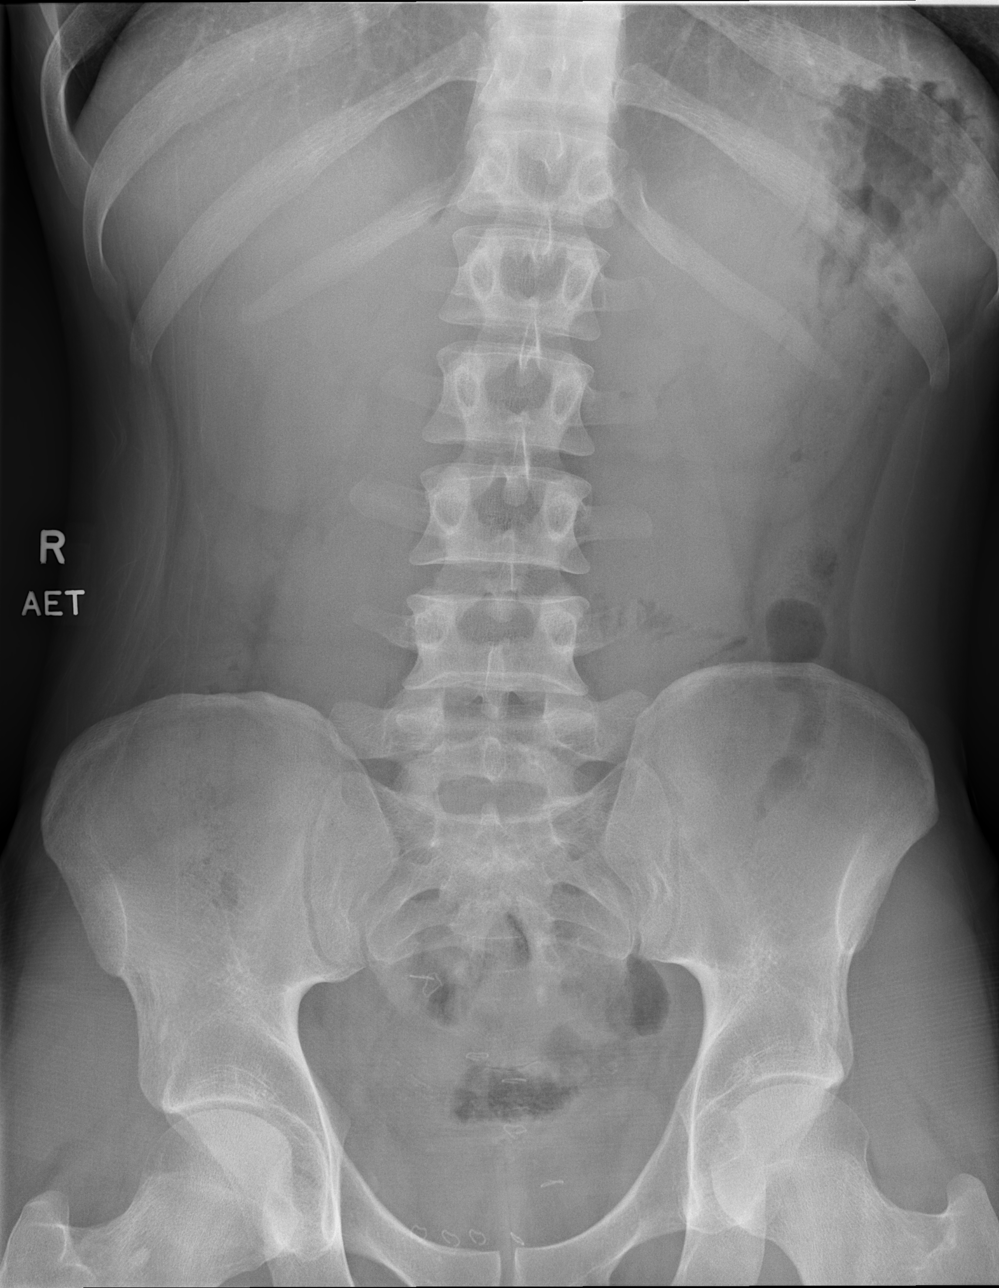

[3 of 3 positions shown; findings below may reference images not displayed]

FINDINGS: There is no evidence of dilated bowel loops or free intraperitoneal
air. No radiopaque calculi or other significant radiographic
abnormality is seen. Surgical changes project over the pelvis. Heart
size and mediastinal contours are within normal limits. Both lungs
are clear.
IMPRESSION: Negative abdominal radiographs.  No acute cardiopulmonary disease.

## 2022-05-17 ENCOUNTER — Ambulatory Visit: Payer: Self-pay | Admitting: Family Medicine

## 2022-05-21 ENCOUNTER — Ambulatory Visit: Payer: Self-pay | Admitting: Student

## 2022-11-11 ENCOUNTER — Emergency Department (HOSPITAL_COMMUNITY)
Admission: EM | Admit: 2022-11-11 | Discharge: 2022-11-11 | Disposition: A | Discharge status: Home / Self Care | Payer: Self-pay | Attending: Emergency Medicine | Admitting: Emergency Medicine

## 2022-11-11 ENCOUNTER — Encounter (HOSPITAL_COMMUNITY): Payer: Self-pay

## 2022-11-11 ENCOUNTER — Other Ambulatory Visit: Payer: Self-pay

## 2022-11-11 DIAGNOSIS — B309 Viral conjunctivitis, unspecified: Secondary | ICD-10-CM | POA: Insufficient documentation

## 2022-11-11 MED ORDER — NAPHAZOLINE-GLYCERIN 0.012-0.25 % OP SOLN
1.0000 [drp] | Freq: Four times a day (QID) | OPHTHALMIC | Status: DC | PRN
  Administered 2022-11-11: 2 [drp] via OPHTHALMIC
  Filled 2022-11-11: qty 15

## 2022-11-11 NOTE — ED Triage Notes (Signed)
Pt c/o left eye reddness and itching started yesterday. Pt unsure if he has drainage from eye, but states "it's not crusting up."

## 2022-11-11 NOTE — ED Provider Notes (Signed)
[place] EMERGENCY DEPARTMENT AT [facility] Provider Note   CSN: [id number] Arrival date & time: 11/11/22  1128     History  Chief Complaint  Patient presents with   Eye Problem    Andrew Page is a 27 y.o. male with PMHx anxiety, ADHD, substance abuse who presents to ED concerned for left eye redness and itching x1 day. Denies contact use. Denies vision changes. Denies drainage. Denies eye trauma. Denies eye pain/headache.  Denies headache, fever, chest pain, sore throat, ear pain, dyspnea, cough, nausea, vomiting, diarrhea.    Eye Problem Associated symptoms: itching        Home Medications Prior to Admission medications   Medication Sig Start Date End Date Taking? Authorizing Provider  doxycycline (VIBRAMYCIN) 100 MG capsule Take 1 capsule (100 mg total) by mouth 2 (two) times daily. 08/11/21   Al Decant, PA-C  ondansetron (ZOFRAN ODT) 4 MG disintegrating tablet Take 1 tablet (4 mg total) by mouth every 8 (eight) hours as needed for nausea or vomiting. 11/12/20   Army Melia A, PA-C  ondansetron (ZOFRAN) 4 MG tablet Take 1 tablet (4 mg total) by mouth every 8 (eight) hours as needed for nausea or vomiting. 11/21/20   Armbruster, Willaim Rayas, MD  pantoprazole (PROTONIX) 20 MG tablet Take 1 tablet (20 mg total) by mouth 2 (two) times daily. Note: Increase from once a day to TWICE a day 11/16/20 01/15/21  Armbruster, Willaim Rayas, MD      Allergies    Patient has no known allergies.    Review of Systems   Review of Systems  Eyes:  Positive for itching.    Physical Exam Updated Vital Signs BP (!) 129/100 (BP Location: Right Arm)   Pulse 68   Temp 98.4 F (36.9 C) (Oral)   Resp 14   Ht 5' 9.5" (1.765 m)   Wt 69.4 kg   SpO2 99%   BMI 22.27 kg/m  Physical Exam Vitals and nursing note reviewed.  Constitutional:      General: Andrew Page is not in acute distress.    Appearance: Andrew Page is not ill-appearing or toxic-appearing.  HENT:     Head: Normocephalic  and atraumatic.     Mouth/Throat:     Mouth: Mucous membranes are moist.  Eyes:     General: No scleral icterus.       Right eye: No discharge.     Comments: Left eye: mild conjunctival injection. Mild watery discharge. No chemosis. No foreign bodies appreciated. PERRL. EOM intact. No periorbital swelling. Visual acuity 20/20.  Cardiovascular:     Rate and Rhythm: Normal rate and regular rhythm.     Pulses: Normal pulses.     Heart sounds: Normal heart sounds. No murmur heard. Pulmonary:     Effort: Pulmonary effort is normal. No respiratory distress.     Breath sounds: Normal breath sounds. No wheezing, rhonchi or rales.  Musculoskeletal:     Right lower leg: No edema.     Left lower leg: No edema.  Skin:    General: Skin is warm and dry.     Findings: No rash.  Neurological:     General: No focal deficit present.     Mental Status: Andrew Page is alert. Mental status is at baseline.  Psychiatric:        Mood and Affect: Mood normal.     ED Results / Procedures / Treatments   Labs (all labs ordered are listed, but only abnormal results are displayed)  Labs Reviewed - No data to display  EKG None  Radiology No results found.  Procedures Procedures    Medications Ordered in ED Medications  naphazoline-glycerin (CLEAR EYES REDNESS) ophth solution 1-2 drop (has no administration in time range)    ED Course/ Medical Decision Making/ A&P                                 Medical Decision Making  This patient presents to the ED for concern of left eye itching/reddness, this involves an extensive number of treatment options, and is a complaint that carries with it a high risk of complications and morbidity.  The differential diagnosis includes blepharitis, viral/bacterial conjunctivitis, corneal abrasion, dry eye syndrome, subconjunctival hemorrhage, acute angle-closure glaucoma, iritis, keratitis,   Co morbidities that complicate the patient evaluation  none   Additional  history obtained:  I am not seeing a PCP in patient's chart - will refer to the community clinic   Problem List / ED Course / Critical interventions / Medication management  Patient presents to ED concern for left eye itching and redness.  Symptoms started yesterday.  Patient denying any other infectious symptoms today.  Denying eye pain, headache, vision changes.  Visual acuity reassuring.  Physical exam reassuring.  Patient afebrile with stable vitals. Patient's symptoms and physical exam concerning for viral conjunctivitis. Provided patient with topical antihistamine eyedrops.  Provided patient with ophthalmology information for follow-up if symptoms are not appropriately resolving or in the next 48 to 72 hours.  Patient verbalized understanding of plan. I have reviewed the patients home medicines and have made adjustments as needed Patient afebrile with stable vitals.  Provided with return precautions.  Discharged in good condition.  DDx: these are considered less likely due to history of present illness and physical exam -blepharitis: no lid inflammation or greasy flakes on eyelids -corneal abrasion: no direct trauma, severe pain, photophobia, or foreign body sensation; does not wear contacts -subconjunctival hemorrhage: physical exam reassuring -acute angle-closure glaucoma: visual acuity without concern, patient denying eye pain/headache -keratitis: denies contact use, visual acuity without concern   Social Determinants of Health:  none          Final Clinical Impression(s) / ED Diagnoses Final diagnoses:  Viral conjunctivitis    Rx / DC Orders ED Discharge Orders     None         Margarita Rana 11/11/22 1213    Laurence Spates, MD 11/12/22 1559

## 2022-11-11 NOTE — Discharge Instructions (Addendum)
It was a pleasure caring for you today.  Physical exam was reassuring.  As discussed, if symptoms are not resolving in the next 5-7 days you will need to follow-up with ophthalmology.  Please also follow-up with your primary care provider.  If you do not have a primary care provider, I provided information for the community clinic in this discharge paperwork.  Seek emergency care if experiencing any new or worsening symptoms.  You can use your eye drops up to 4x daily for symptom control.

## 2022-11-26 ENCOUNTER — Emergency Department (HOSPITAL_BASED_OUTPATIENT_CLINIC_OR_DEPARTMENT_OTHER)
Admission: EM | Admit: 2022-11-26 | Discharge: 2022-11-26 | Disposition: A | Discharge status: Home / Self Care | Payer: Self-pay | Attending: Emergency Medicine | Admitting: Emergency Medicine

## 2022-11-26 ENCOUNTER — Encounter (HOSPITAL_BASED_OUTPATIENT_CLINIC_OR_DEPARTMENT_OTHER): Payer: Self-pay

## 2022-11-26 ENCOUNTER — Other Ambulatory Visit: Payer: Self-pay

## 2022-11-26 DIAGNOSIS — L02215 Cutaneous abscess of perineum: Secondary | ICD-10-CM | POA: Insufficient documentation

## 2022-11-26 MED ORDER — DOXYCYCLINE HYCLATE 100 MG PO TABS
100.0000 mg | ORAL_TABLET | Freq: Once | ORAL | Status: AC
  Administered 2022-11-26: 100 mg via ORAL
  Filled 2022-11-26: qty 1

## 2022-11-26 MED ORDER — DOXYCYCLINE HYCLATE 100 MG PO CAPS: 100.0000 mg | ORAL_CAPSULE | Freq: Two times a day (BID) | ORAL | 0 refills | Status: DC

## 2022-11-26 NOTE — ED Triage Notes (Signed)
Pt c/o peritoneal abscess, naproxen for pain. Does not want blood work, just wants abx. States he has hx, has denied I&D in the past, states he is still trying to get to work today.

## 2022-11-26 NOTE — ED Provider Notes (Signed)
[place] EMERGENCY DEPARTMENT AT [facility] Provider Note   CSN: [id number] Arrival date & time: 11/26/22  1509     History  Chief Complaint  Patient presents with   Abscess    Peritoneal    Andrew Page is a 27 y.o. male.  Patient presents emergency department today for evaluation of left-sided perineal abscess.  Patient states that he has had these recurrently.  He first noted the area getting sore again about 2 days ago.  It is mildly swollen.  No drainage.  No difficulty with urination or with bowel movements.  He has been doing warm soaks at home.  He states that he is not interested in having it opened or drained today.  He would like oral antibiotics for this.  No fevers, nausea or vomiting.       Home Medications Prior to Admission medications   Medication Sig Start Date End Date Taking? Authorizing Provider  doxycycline (VIBRAMYCIN) 100 MG capsule Take 1 capsule (100 mg total) by mouth 2 (two) times daily. 11/26/22  Yes [name], PA-C  ondansetron (ZOFRAN ODT) 4 MG disintegrating tablet Take 1 tablet (4 mg total) by mouth every 8 (eight) hours as needed for nausea or vomiting. 11/12/20   Army Melia A, PA-C  ondansetron (ZOFRAN) 4 MG tablet Take 1 tablet (4 mg total) by mouth every 8 (eight) hours as needed for nausea or vomiting. 11/21/20   Armbruster, Willaim Rayas, MD  pantoprazole (PROTONIX) 20 MG tablet Take 1 tablet (20 mg total) by mouth 2 (two) times daily. Note: Increase from once a day to TWICE a day 11/16/20 01/15/21  Armbruster, Willaim Rayas, MD      Allergies    Patient has no known allergies.    Review of Systems   Review of Systems  Physical Exam Updated Vital Signs BP (!) 131/105 (BP Location: Right Arm)   Pulse 80   Temp 98.2 F (36.8 C) (Oral)   Resp 17   SpO2 96%  Physical Exam Vitals and nursing note reviewed.  Constitutional:      Appearance: He is well-developed.  HENT:     Head: Normocephalic and atraumatic.  Eyes:      Conjunctiva/sclera: Conjunctivae normal.  Pulmonary:     Effort: No respiratory distress.  Genitourinary:    Comments: Patient has a small approximately 1 cm area of swelling to the left perineum.  Does not appear to involve the scrotum.  This does not appear to be perirectal or perianal.  No overlying erythema.  No active drainage. Musculoskeletal:     Cervical back: Normal range of motion and neck supple.  Skin:    General: Skin is warm and dry.  Neurological:     Mental Status: He is alert.     ED Results / Procedures / Treatments   Labs (all labs ordered are listed, but only abnormal results are displayed) Labs Reviewed - No data to display  EKG None  Radiology No results found.  Procedures Procedures    Medications Ordered in ED Medications  doxycycline (VIBRA-TABS) tablet 100 mg (has no administration in time range)    ED Course/ Medical Decision Making/ A&P    Patient seen and examined.  Likely with small abscess.  Labs/EKG: None ordered  Imaging: None ordered  Medications/Fluids: Ordered: Oral doxycycline  Most recent vital signs reviewed and are as follows: BP (!) 131/105 (BP Location: Right Arm)   Pulse 80   Temp 98.2 F (36.8 C) (Oral)  Resp 17   SpO2 96%   Initial impression: Small cutaneous abscess  Discussed incision and drainage procedure.  Patient is electing not to have this performed at this time.  He does state that he is willing to come back if his swelling and pain gets worse.  He will continue naproxen and warm soaks in the interim.  Home treatment plan: P.o. doxycycline  Return instructions discussed with patient: The patient was urged to return to the Emergency Department urgently with worsening pain, swelling, expanding erythema especially if it streaks away from the affected area, fever, or if they have any other concerns.   The patient was urged to return to the Emergency Department or go to their PCP in 48 hours for wound  recheck if the area is not significantly improved.  The patient verbalized understanding and stated agreement with this plan.                                  Medical Decision Making Risk Prescription drug management.   Patient with small abscess.  No overlying cellulitis.  This does not appear to be a necrotizing infection.  Patient appears well, nontoxic.  He is interested in only oral antibiotics at this time.        Final Clinical Impression(s) / ED Diagnoses Final diagnoses:  Abscess of perineum    Rx / DC Orders ED Discharge Orders          Ordered    doxycycline (VIBRAMYCIN) 100 MG capsule  2 times daily        11/26/22 2138              [name], PA-C 11/26/22 2142    Horton, Clabe Seal, DO 11/26/22 2323

## 2022-11-26 NOTE — Discharge Instructions (Signed)
Please read and follow all provided instructions.  Your diagnoses today include:  1. Abscess of perineum    Tests performed today include: Vital signs. See below for your results today.   Medications prescribed:  Doxycycline - antibiotic  You have been prescribed an antibiotic medicine: take the entire course of medicine even if you are feeling better. Stopping early can cause the antibiotic not to work.  Take any prescribed medications only as directed.   Home care instructions:  Follow any educational materials contained in this packet  Follow-up instructions: Return to the Emergency Department in 48 hours for a recheck if your symptoms are not significantly improved.  Please follow-up with your primary care provider in the next 1 week for further evaluation of your symptoms.   Return instructions:  Return to the Emergency Department if you have: Fever Worsening symptoms Worsening pain Worsening swelling Redness of the skin that moves away from the affected area, especially if it streaks away from the affected area  Any other emergent concerns   Your vital signs today were: BP (!) 131/105 (BP Location: Right Arm)   Pulse 80   Temp 98.2 F (36.8 C) (Oral)   Resp 17   SpO2 96%  If your blood pressure (BP) was elevated above 135/85 this visit, please have this repeated by your doctor within one month. --------------

## 2023-04-27 ENCOUNTER — Inpatient Hospital Stay (HOSPITAL_COMMUNITY): Payer: Self-pay

## 2023-04-27 ENCOUNTER — Other Ambulatory Visit: Payer: Self-pay

## 2023-04-27 ENCOUNTER — Encounter (HOSPITAL_COMMUNITY): Payer: Self-pay | Admitting: Family Medicine

## 2023-04-27 ENCOUNTER — Inpatient Hospital Stay (HOSPITAL_COMMUNITY)
Admission: EM | Admit: 2023-04-27 | Discharge: 2023-05-08 | DRG: 682 | Disposition: A | Discharge status: Home / Self Care | Payer: MEDICAID | Attending: Internal Medicine | Admitting: Internal Medicine

## 2023-04-27 ENCOUNTER — Emergency Department (HOSPITAL_COMMUNITY): Payer: Self-pay

## 2023-04-27 DIAGNOSIS — L03113 Cellulitis of right upper limb: Secondary | ICD-10-CM | POA: Diagnosis present

## 2023-04-27 DIAGNOSIS — Z833 Family history of diabetes mellitus: Secondary | ICD-10-CM | POA: Diagnosis not present

## 2023-04-27 DIAGNOSIS — J9601 Acute respiratory failure with hypoxia: Secondary | ICD-10-CM | POA: Diagnosis not present

## 2023-04-27 DIAGNOSIS — J690 Pneumonitis due to inhalation of food and vomit: Secondary | ICD-10-CM | POA: Diagnosis present

## 2023-04-27 DIAGNOSIS — R1111 Vomiting without nausea: Secondary | ICD-10-CM | POA: Diagnosis not present

## 2023-04-27 DIAGNOSIS — J810 Acute pulmonary edema: Secondary | ICD-10-CM | POA: Diagnosis not present

## 2023-04-27 DIAGNOSIS — K7010 Alcoholic hepatitis without ascites: Secondary | ICD-10-CM | POA: Diagnosis present

## 2023-04-27 DIAGNOSIS — I340 Nonrheumatic mitral (valve) insufficiency: Secondary | ICD-10-CM | POA: Diagnosis not present

## 2023-04-27 DIAGNOSIS — D649 Anemia, unspecified: Secondary | ICD-10-CM | POA: Diagnosis present

## 2023-04-27 DIAGNOSIS — Z8249 Family history of ischemic heart disease and other diseases of the circulatory system: Secondary | ICD-10-CM

## 2023-04-27 DIAGNOSIS — D696 Thrombocytopenia, unspecified: Secondary | ICD-10-CM | POA: Diagnosis present

## 2023-04-27 DIAGNOSIS — Z992 Dependence on renal dialysis: Secondary | ICD-10-CM | POA: Diagnosis not present

## 2023-04-27 DIAGNOSIS — Z79899 Other long term (current) drug therapy: Secondary | ICD-10-CM

## 2023-04-27 DIAGNOSIS — F17290 Nicotine dependence, other tobacco product, uncomplicated: Secondary | ICD-10-CM | POA: Diagnosis present

## 2023-04-27 DIAGNOSIS — B159 Hepatitis A without hepatic coma: Secondary | ICD-10-CM | POA: Diagnosis not present

## 2023-04-27 DIAGNOSIS — J189 Pneumonia, unspecified organism: Secondary | ICD-10-CM | POA: Diagnosis present

## 2023-04-27 DIAGNOSIS — K759 Inflammatory liver disease, unspecified: Secondary | ICD-10-CM

## 2023-04-27 DIAGNOSIS — S92154A Nondisplaced avulsion fracture (chip fracture) of right talus, initial encounter for closed fracture: Secondary | ICD-10-CM

## 2023-04-27 DIAGNOSIS — E559 Vitamin D deficiency, unspecified: Secondary | ICD-10-CM

## 2023-04-27 DIAGNOSIS — F17210 Nicotine dependence, cigarettes, uncomplicated: Secondary | ICD-10-CM | POA: Diagnosis present

## 2023-04-27 DIAGNOSIS — R319 Hematuria, unspecified: Secondary | ICD-10-CM | POA: Diagnosis present

## 2023-04-27 DIAGNOSIS — F909 Attention-deficit hyperactivity disorder, unspecified type: Secondary | ICD-10-CM | POA: Diagnosis present

## 2023-04-27 DIAGNOSIS — I2722 Pulmonary hypertension due to left heart disease: Secondary | ICD-10-CM | POA: Diagnosis present

## 2023-04-27 DIAGNOSIS — R030 Elevated blood-pressure reading, without diagnosis of hypertension: Secondary | ICD-10-CM | POA: Diagnosis not present

## 2023-04-27 DIAGNOSIS — F913 Oppositional defiant disorder: Secondary | ICD-10-CM | POA: Diagnosis present

## 2023-04-27 DIAGNOSIS — I5033 Acute on chronic diastolic (congestive) heart failure: Secondary | ICD-10-CM | POA: Diagnosis not present

## 2023-04-27 DIAGNOSIS — E871 Hypo-osmolality and hyponatremia: Secondary | ICD-10-CM | POA: Diagnosis present

## 2023-04-27 DIAGNOSIS — E8721 Acute metabolic acidosis: Secondary | ICD-10-CM | POA: Diagnosis present

## 2023-04-27 DIAGNOSIS — X58XXXA Exposure to other specified factors, initial encounter: Secondary | ICD-10-CM | POA: Diagnosis present

## 2023-04-27 DIAGNOSIS — N179 Acute kidney failure, unspecified: Principal | ICD-10-CM | POA: Diagnosis present

## 2023-04-27 DIAGNOSIS — F32A Depression, unspecified: Secondary | ICD-10-CM | POA: Diagnosis not present

## 2023-04-27 DIAGNOSIS — F419 Anxiety disorder, unspecified: Secondary | ICD-10-CM | POA: Diagnosis present

## 2023-04-27 DIAGNOSIS — M6282 Rhabdomyolysis: Secondary | ICD-10-CM | POA: Diagnosis not present

## 2023-04-27 DIAGNOSIS — R112 Nausea with vomiting, unspecified: Secondary | ICD-10-CM | POA: Diagnosis not present

## 2023-04-27 DIAGNOSIS — I10 Essential (primary) hypertension: Secondary | ICD-10-CM | POA: Diagnosis not present

## 2023-04-27 DIAGNOSIS — E861 Hypovolemia: Secondary | ICD-10-CM | POA: Diagnosis present

## 2023-04-27 DIAGNOSIS — F1014 Alcohol abuse with alcohol-induced mood disorder: Secondary | ICD-10-CM

## 2023-04-27 DIAGNOSIS — S92151A Displaced avulsion fracture (chip fracture) of right talus, initial encounter for closed fracture: Secondary | ICD-10-CM | POA: Diagnosis present

## 2023-04-27 DIAGNOSIS — R7989 Other specified abnormal findings of blood chemistry: Secondary | ICD-10-CM

## 2023-04-27 DIAGNOSIS — N170 Acute kidney failure with tubular necrosis: Principal | ICD-10-CM | POA: Diagnosis present

## 2023-04-27 DIAGNOSIS — M7989 Other specified soft tissue disorders: Secondary | ICD-10-CM

## 2023-04-27 DIAGNOSIS — R9431 Abnormal electrocardiogram [ECG] [EKG]: Secondary | ICD-10-CM | POA: Diagnosis not present

## 2023-04-27 DIAGNOSIS — E876 Hypokalemia: Secondary | ICD-10-CM | POA: Diagnosis present

## 2023-04-27 DIAGNOSIS — R809 Proteinuria, unspecified: Secondary | ICD-10-CM | POA: Diagnosis present

## 2023-04-27 DIAGNOSIS — F1010 Alcohol abuse, uncomplicated: Secondary | ICD-10-CM

## 2023-04-27 DIAGNOSIS — E872 Acidosis, unspecified: Secondary | ICD-10-CM

## 2023-04-27 DIAGNOSIS — R000 Tachycardia, unspecified: Secondary | ICD-10-CM | POA: Diagnosis present

## 2023-04-27 DIAGNOSIS — L03114 Cellulitis of left upper limb: Secondary | ICD-10-CM

## 2023-04-27 LAB — URINALYSIS, COMPLETE (UACMP) WITH MICROSCOPIC
Bilirubin Urine: NEGATIVE
Glucose, UA: 50 mg/dL — AB
Ketones, ur: NEGATIVE mg/dL
Leukocytes,Ua: NEGATIVE
Nitrite: NEGATIVE
Protein, ur: 100 mg/dL — AB
Specific Gravity, Urine: 1.011 (ref 1.005–1.030)
pH: 5 (ref 5.0–8.0)

## 2023-04-27 LAB — COMPREHENSIVE METABOLIC PANEL
ALT: 4942 U/L — ABNORMAL HIGH (ref 0–44)
AST: >7000 U/L — ABNORMAL HIGH (ref 15–41)
Albumin: 3.7 g/dL (ref 3.5–5.0)
Alkaline Phosphatase: 87 U/L (ref 38–126)
Anion gap: 20 — ABNORMAL HIGH (ref 5–15)
BUN: 24 mg/dL — ABNORMAL HIGH (ref 6–20)
CO2: 17 mmol/L — ABNORMAL LOW (ref 22–32)
Calcium: 6.6 mg/dL — ABNORMAL LOW (ref 8.9–10.3)
Chloride: 107 mmol/L (ref 98–111)
Creatinine, Ser: 3.22 mg/dL — ABNORMAL HIGH (ref 0.61–1.24)
GFR, Estimated: 26 mL/min — ABNORMAL LOW (ref >60)
Glucose, Bld: 86 mg/dL (ref 70–99)
Potassium: 5.1 mmol/L (ref 3.5–5.1)
Sodium: 144 mmol/L (ref 135–145)
Total Bilirubin: 1.5 mg/dL — ABNORMAL HIGH (ref 0.0–1.2)
Total Protein: 6.6 g/dL (ref 6.5–8.1)

## 2023-04-27 LAB — CBC WITH DIFFERENTIAL/PLATELET
Abs Immature Granulocytes: 0.13 10*3/uL — ABNORMAL HIGH (ref 0.00–0.07)
Basophils Absolute: 0 10*3/uL (ref 0.0–0.1)
Basophils Relative: 0 %
Eosinophils Absolute: 0 10*3/uL (ref 0.0–0.5)
Eosinophils Relative: 0 %
HCT: 46.7 % (ref 39.0–52.0)
Hemoglobin: 14.8 g/dL (ref 13.0–17.0)
Immature Granulocytes: 1 %
Lymphocytes Relative: 6 %
Lymphs Abs: 1.1 10*3/uL (ref 0.7–4.0)
MCH: 31 pg (ref 26.0–34.0)
MCHC: 31.7 g/dL (ref 30.0–36.0)
MCV: 97.9 fL (ref 80.0–100.0)
Monocytes Absolute: 1.3 10*3/uL — ABNORMAL HIGH (ref 0.1–1.0)
Monocytes Relative: 6 %
Neutro Abs: 17 10*3/uL — ABNORMAL HIGH (ref 1.7–7.7)
Neutrophils Relative %: 87 %
Platelets: 220 10*3/uL (ref 150–400)
RBC: 4.77 MIL/uL (ref 4.22–5.81)
RDW: 14.4 % (ref 11.5–15.5)
WBC: 19.5 10*3/uL — ABNORMAL HIGH (ref 4.0–10.5)
nRBC: 0 % (ref 0.0–0.2)

## 2023-04-27 LAB — PROTIME-INR
INR: 1.5 — ABNORMAL HIGH (ref 0.8–1.2)
Prothrombin Time: 17.9 s — ABNORMAL HIGH (ref 11.4–15.2)

## 2023-04-27 LAB — HEPATITIS PANEL, ACUTE
HCV Ab: NONREACTIVE
Hep A IgM: NONREACTIVE
Hep B C IgM: NONREACTIVE
Hepatitis B Surface Ag: NONREACTIVE

## 2023-04-27 LAB — MAGNESIUM: Magnesium: 2 mg/dL (ref 1.7–2.4)

## 2023-04-27 LAB — ACETAMINOPHEN LEVEL: Acetaminophen (Tylenol), Serum: <10 ug/mL — ABNORMAL LOW (ref 10–30)

## 2023-04-27 LAB — ETHANOL: Alcohol, Ethyl (B): <10 mg/dL (ref <10)

## 2023-04-27 LAB — LIPASE, BLOOD: Lipase: 27 U/L (ref 11–51)

## 2023-04-27 MED ORDER — ONDANSETRON HCL 4 MG/2ML IJ SOLN
4.0000 mg | Freq: Once | INTRAMUSCULAR | Status: AC
  Administered 2023-04-27: 4 mg via INTRAVENOUS
  Filled 2023-04-27: qty 2

## 2023-04-27 MED ORDER — SODIUM CHLORIDE 0.9 % IV SOLN: Freq: Once | INTRAVENOUS | Status: AC

## 2023-04-27 MED ORDER — SODIUM CHLORIDE 0.9 % IV BOLUS
1000.0000 mL | Freq: Once | INTRAVENOUS | Status: AC
  Administered 2023-04-27: 1000 mL via INTRAVENOUS

## 2023-04-27 MED ORDER — FAMOTIDINE IN NACL 20-0.9 MG/50ML-% IV SOLN
20.0000 mg | Freq: Once | INTRAVENOUS | Status: AC
  Administered 2023-04-27: 20 mg via INTRAVENOUS
  Filled 2023-04-27: qty 50

## 2023-04-27 MED ORDER — PHYTONADIONE 5 MG PO TABS
10.0000 mg | ORAL_TABLET | Freq: Once | ORAL | Status: AC
  Administered 2023-04-28: 10 mg via ORAL
  Filled 2023-04-27: qty 2

## 2023-04-27 MED ORDER — ZIPRASIDONE MESYLATE 20 MG IM SOLR: 10.0000 mg | Freq: Once | INTRAMUSCULAR | Status: DC

## 2023-04-27 NOTE — ED Triage Notes (Signed)
 Pt to ED via EMS from home after 4-5 days of binge drinking until 0800 this morning. Started vomiting around 1600 today. VSS. Did not vomit with EMS-dry heaves only. Pt refused IV and meds with EMS.

## 2023-04-27 NOTE — ED Notes (Signed)
Water provided to pt per providers request.

## 2023-04-27 NOTE — ED Provider Notes (Signed)
 [place] EMERGENCY DEPARTMENT AT [facility] Provider Note   CSN: [id number] Arrival date & time: 04/27/23  1655     History  Chief Complaint  Patient presents with   Drug / Alcohol Assessment   Emesis    Andrew Page is a 28 y.o. male.  52 patient complains of vomiting.  Patient reports that he has been drinking for the last 2 days.  Patient reports consuming a large amount of alcohol.  Patient reports he has vomited multiple times.  He denies using any drugs.  Patient states that he does smoke marijuana.  Patient complains of being very thirsty.  Patient denies any fever or chills he denies any cough or congestion he denies being around anyone who has been sick.  Patient denies any diarrhea.  The history is provided by the patient. No language interpreter was used.  Drug / Alcohol Assessment Suspected agents:  Alcohol Associated symptoms: vomiting   Emesis      Home Medications Prior to Admission medications   Medication Sig Start Date End Date Taking? Authorizing Provider  doxycycline (VIBRAMYCIN) 100 MG capsule Take 1 capsule (100 mg total) by mouth 2 (two) times daily. 11/26/22   [name], PA-C  ondansetron (ZOFRAN ODT) 4 MG disintegrating tablet Take 1 tablet (4 mg total) by mouth every 8 (eight) hours as needed for nausea or vomiting. 11/12/20   Army Melia A, PA-C  ondansetron (ZOFRAN) 4 MG tablet Take 1 tablet (4 mg total) by mouth every 8 (eight) hours as needed for nausea or vomiting. 11/21/20   Armbruster, Willaim Rayas, MD  pantoprazole (PROTONIX) 20 MG tablet Take 1 tablet (20 mg total) by mouth 2 (two) times daily. Note: Increase from once a day to TWICE a day 11/16/20 01/15/21  Armbruster, Willaim Rayas, MD      Allergies    Patient has no known allergies.    Review of Systems   Review of Systems  Gastrointestinal:  Positive for vomiting.  All other systems reviewed and are negative.   Physical Exam Updated Vital Signs BP 117/86   Pulse 87    Temp 97.9 F (36.6 C) (Oral)   Resp 17   Ht 5\' 10"  (1.778 m)   Wt 65.8 kg   SpO2 100%   BMI 20.81 kg/m  Physical Exam Vitals and nursing note reviewed.  Constitutional:      Appearance: He is well-developed.  HENT:     Head: Normocephalic.     Nose: Nose normal.     Mouth/Throat:     Mouth: Mucous membranes are moist.  Cardiovascular:     Rate and Rhythm: Normal rate.  Pulmonary:     Effort: Pulmonary effort is normal.  Abdominal:     General: There is no distension.  Musculoskeletal:        General: Normal range of motion.     Cervical back: Normal range of motion.  Skin:    General: Skin is warm.  Neurological:     General: No focal deficit present.     Mental Status: He is alert and oriented to person, place, and time.  Psychiatric:        Mood and Affect: Mood normal.     ED Results / Procedures / Treatments   Labs (all labs ordered are listed, but only abnormal results are displayed) Labs Reviewed  CBC WITH DIFFERENTIAL/PLATELET - Abnormal; Notable for the following components:      Result Value   WBC 19.5 (*)  Neutro Abs 17.0 (*)    Monocytes Absolute 1.3 (*)    Abs Immature Granulocytes 0.13 (*)    All other components within normal limits  COMPREHENSIVE METABOLIC PANEL - Abnormal; Notable for the following components:   CO2 17 (*)    BUN 24 (*)    Creatinine, Ser 3.22 (*)    Calcium 6.6 (*)    AST >7,000 (*)    ALT 4,942 (*)    Total Bilirubin 1.5 (*)    GFR, Estimated 26 (*)    Anion gap 20 (*)    All other components within normal limits  LIPASE, BLOOD  MAGNESIUM  HEPATITIS PANEL, ACUTE  PROTIME-INR  ETHANOL    EKG EKG Interpretation Date/Time:  Saturday April 27 2023 17:18:03 EST Ventricular Rate:  87 PR Interval:  142 QRS Duration:  82 QT Interval:  405 QTC Calculation: 488 R Axis:   78  Text Interpretation: Sinus rhythm Right atrial enlargement Borderline prolonged QT interval Confirmed by Coralee Pesa [phone]) on 04/27/2023  5:25:04 PM  Radiology DG Foot Complete Right Result Date: 04/27/2023 CLINICAL DATA:  Right foot pain. EXAM: RIGHT FOOT COMPLETE - 3+ VIEW COMPARISON:  April 24, 2014 FINDINGS: There is a small area of cortical irregularity seen along the dorsal aspect of the talus. This is of indeterminate age and represents a new finding when compared to the prior study. There is no evidence of dislocation. There is no evidence of arthropathy or other focal bone abnormality. Soft tissues are unremarkable. IMPRESSION: Findings along the dorsal aspect of the right talus which may represent a small avulsion fracture of indeterminate age. Correlation with physical examination is recommended to determine the presence of point tenderness. Electronically Signed   By: Aram Candela M.D.   On: 04/27/2023 19:43    Procedures Procedures    Medications Ordered in ED Medications  sodium chloride 0.9 % bolus 1,000 mL (0 mLs Intravenous Stopped 04/27/23 1845)  ondansetron (ZOFRAN) injection 4 mg (4 mg Intravenous Given 04/27/23 1728)  famotidine (PEPCID) IVPB 20 mg premix (0 mg Intravenous Stopped 04/27/23 1800)  sodium chloride 0.9 % bolus 1,000 mL (1,000 mLs Intravenous New Bag/Given 04/27/23 1955)  0.9 %  sodium chloride infusion ( Intravenous New Bag/Given 04/27/23 2000)    ED Course/ Medical Decision Making/ A&P                                 Medical Decision Making Patient reports drinking a large amount of alcohol.  Patient has vomited multiple times  Amount and/or Complexity of Data Reviewed Independent Historian: EMS    Details: Patient brought in by EMS. Labs: ordered. Decision-making details documented in ED Course.    Details: Labs ordered reviewed and interpreted.  BUN is elevated at 24 creatinine is 3.22 CO2 is 17.  AST is greater than 7000 ALT is 4942 bilirubin is 1.5.  Anion gap is 20. Radiology: ordered and independent interpretation performed. Decision-making details documented in ED Course.    Details:  X-ray right foot shows an avulsion fracture right talus. Discussion of management or test interpretation with external provider(s): Hospitalist consulted due to acute kidney injury and liver function elevation    Risk Prescription drug management. Decision regarding hospitalization. Risk Details: Patient given IV fluids x 2 L.  Zofran IV.  Patient is able to tolerate oral fluids.           Final Clinical Impression(s) / ED  Diagnoses Final diagnoses:  Acute kidney injury (HCC)  Hepatitis  Closed nondisplaced avulsion fracture of right talus, initial encounter    Rx / DC Orders ED Discharge Orders     None         Elson Areas, [place] 04/27/23 2253    Rozelle Logan, DO 04/27/23 2320

## 2023-04-28 ENCOUNTER — Inpatient Hospital Stay (HOSPITAL_COMMUNITY): Payer: Self-pay

## 2023-04-28 ENCOUNTER — Encounter (HOSPITAL_COMMUNITY): Payer: Self-pay | Admitting: Family Medicine

## 2023-04-28 DIAGNOSIS — S92151A Displaced avulsion fracture (chip fracture) of right talus, initial encounter for closed fracture: Secondary | ICD-10-CM | POA: Diagnosis present

## 2023-04-28 DIAGNOSIS — E872 Acidosis, unspecified: Secondary | ICD-10-CM | POA: Diagnosis present

## 2023-04-28 DIAGNOSIS — M6282 Rhabdomyolysis: Secondary | ICD-10-CM | POA: Diagnosis present

## 2023-04-28 LAB — COMPREHENSIVE METABOLIC PANEL
ALT: 3529 U/L — ABNORMAL HIGH (ref 0–44)
AST: >7000 U/L — ABNORMAL HIGH (ref 15–41)
Albumin: 3 g/dL — ABNORMAL LOW (ref 3.5–5.0)
Alkaline Phosphatase: 69 U/L (ref 38–126)
Anion gap: 16 — ABNORMAL HIGH (ref 5–15)
BUN: 31 mg/dL — ABNORMAL HIGH (ref 6–20)
CO2: 20 mmol/L — ABNORMAL LOW (ref 22–32)
Calcium: 6.4 mg/dL — CL (ref 8.9–10.3)
Chloride: 104 mmol/L (ref 98–111)
Creatinine, Ser: 4.5 mg/dL — ABNORMAL HIGH (ref 0.61–1.24)
GFR, Estimated: 18 mL/min — ABNORMAL LOW (ref >60)
Glucose, Bld: 123 mg/dL — ABNORMAL HIGH (ref 70–99)
Potassium: 3.8 mmol/L (ref 3.5–5.1)
Sodium: 140 mmol/L (ref 135–145)
Total Bilirubin: 1.5 mg/dL — ABNORMAL HIGH (ref 0.0–1.2)
Total Protein: 5.7 g/dL — ABNORMAL LOW (ref 6.5–8.1)

## 2023-04-28 LAB — CBC
HCT: 36.2 % — ABNORMAL LOW (ref 39.0–52.0)
Hemoglobin: 12 g/dL — ABNORMAL LOW (ref 13.0–17.0)
MCH: 31.3 pg (ref 26.0–34.0)
MCHC: 33.1 g/dL (ref 30.0–36.0)
MCV: 94.5 fL (ref 80.0–100.0)
Platelets: 180 10*3/uL (ref 150–400)
RBC: 3.83 MIL/uL — ABNORMAL LOW (ref 4.22–5.81)
RDW: 14 % (ref 11.5–15.5)
WBC: 15.5 10*3/uL — ABNORMAL HIGH (ref 4.0–10.5)
nRBC: 0.1 % (ref 0.0–0.2)

## 2023-04-28 LAB — SODIUM, URINE, RANDOM: Sodium, Ur: 38 mmol/L

## 2023-04-28 LAB — RAPID URINE DRUG SCREEN, HOSP PERFORMED
Amphetamines: NOT DETECTED
Barbiturates: NOT DETECTED
Benzodiazepines: NOT DETECTED
Cocaine: NOT DETECTED
Opiates: NOT DETECTED
Tetrahydrocannabinol: POSITIVE — AB

## 2023-04-28 LAB — CREATININE, URINE, RANDOM: Creatinine, Urine: 178 mg/dL

## 2023-04-28 LAB — MAGNESIUM: Magnesium: 1.7 mg/dL (ref 1.7–2.4)

## 2023-04-28 LAB — CK
Total CK: 26463 U/L — ABNORMAL HIGH (ref 49–397)
Total CK: 32693 U/L — ABNORMAL HIGH (ref 49–397)

## 2023-04-28 LAB — PROTIME-INR
INR: 1.5 — ABNORMAL HIGH (ref 0.8–1.2)
Prothrombin Time: 18.6 s — ABNORMAL HIGH (ref 11.4–15.2)

## 2023-04-28 LAB — HIV ANTIBODY (ROUTINE TESTING W REFLEX): HIV Screen 4th Generation wRfx: NONREACTIVE

## 2023-04-28 LAB — PHOSPHORUS: Phosphorus: 3.3 mg/dL (ref 2.5–4.6)

## 2023-04-28 MED ORDER — LORAZEPAM 1 MG PO TABS
0.0000 mg | ORAL_TABLET | Freq: Two times a day (BID) | ORAL | Status: AC
  Filled 2023-04-28: qty 1

## 2023-04-28 MED ORDER — SODIUM CHLORIDE 0.9 % IV SOLN: INTRAVENOUS | Status: DC

## 2023-04-28 MED ORDER — HEPARIN SODIUM (PORCINE) 5000 UNIT/ML IJ SOLN
5000.0000 [IU] | Freq: Three times a day (TID) | INTRAMUSCULAR | Status: DC
  Administered 2023-04-28 – 2023-05-08 (×26): 5000 [IU] via SUBCUTANEOUS
  Filled 2023-04-28 (×26): qty 1

## 2023-04-28 MED ORDER — CALCIUM CARBONATE 1250 (500 CA) MG PO TABS
1.0000 | ORAL_TABLET | Freq: Two times a day (BID) | ORAL | Status: DC
  Administered 2023-04-28 – 2023-05-08 (×16): 1250 mg via ORAL
  Filled 2023-04-28 (×18): qty 1

## 2023-04-28 MED ORDER — PROCHLORPERAZINE EDISYLATE 10 MG/2ML IJ SOLN
5.0000 mg | Freq: Four times a day (QID) | INTRAMUSCULAR | Status: DC | PRN
  Administered 2023-04-28 (×2): 5 mg via INTRAVENOUS
  Filled 2023-04-28 (×2): qty 2

## 2023-04-28 MED ORDER — SODIUM CHLORIDE 0.9% FLUSH
3.0000 mL | Freq: Two times a day (BID) | INTRAVENOUS | Status: DC
  Administered 2023-04-28 – 2023-05-08 (×18): 3 mL via INTRAVENOUS

## 2023-04-28 MED ORDER — LORAZEPAM 1 MG PO TABS: 0.0000 mg | ORAL_TABLET | Freq: Four times a day (QID) | ORAL | Status: AC

## 2023-04-28 MED ORDER — GUAIFENESIN 100 MG/5ML PO LIQD
5.0000 mL | ORAL | Status: DC | PRN
  Administered 2023-04-28 – 2023-05-05 (×7): 5 mL via ORAL
  Filled 2023-04-28: qty 5
  Filled 2023-04-28 (×6): qty 10

## 2023-04-28 MED ORDER — DEXTROSE 5 % IV SOLN
6.2500 mg/kg/h | INTRAVENOUS | Status: DC
  Administered 2023-04-28: 6.25 mg/kg/h via INTRAVENOUS
  Filled 2023-04-28: qty 90

## 2023-04-28 MED ORDER — OXYCODONE HCL 5 MG PO TABS
5.0000 mg | ORAL_TABLET | ORAL | Status: DC | PRN
  Administered 2023-04-29 – 2023-05-02 (×4): 5 mg via ORAL
  Filled 2023-04-28 (×5): qty 1

## 2023-04-28 MED ORDER — THIAMINE MONONITRATE 100 MG PO TABS
100.0000 mg | ORAL_TABLET | Freq: Every day | ORAL | Status: DC
  Administered 2023-04-28 – 2023-05-08 (×10): 100 mg via ORAL
  Filled 2023-04-28 (×10): qty 1

## 2023-04-28 MED ORDER — FOLIC ACID 1 MG PO TABS
1.0000 mg | ORAL_TABLET | Freq: Every day | ORAL | Status: DC
  Administered 2023-04-28 – 2023-05-08 (×10): 1 mg via ORAL
  Filled 2023-04-28 (×10): qty 1

## 2023-04-28 MED ORDER — SODIUM CHLORIDE 0.9 % IV BOLUS: 1000.0000 mL | Freq: Once | INTRAVENOUS | Status: DC

## 2023-04-28 MED ORDER — ACETYLCYSTEINE LOAD VIA INFUSION
150.0000 mg/kg | Freq: Once | INTRAVENOUS | Status: AC
  Administered 2023-04-28: 9870 mg via INTRAVENOUS
  Filled 2023-04-28: qty 324

## 2023-04-28 MED ORDER — STERILE WATER FOR INJECTION IV SOLN
INTRAVENOUS | Status: DC
  Filled 2023-04-28 (×2): qty 1000

## 2023-04-28 MED ORDER — LORAZEPAM 2 MG/ML IJ SOLN
1.0000 mg | INTRAMUSCULAR | Status: AC | PRN
  Administered 2023-04-28 – 2023-04-30 (×3): 2 mg via INTRAVENOUS
  Administered 2023-04-30: 3 mg via INTRAVENOUS
  Administered 2023-04-30 (×2): 2 mg via INTRAVENOUS
  Filled 2023-04-28: qty 2
  Filled 2023-04-28 (×4): qty 1
  Filled 2023-04-28: qty 2
  Filled 2023-04-28: qty 1

## 2023-04-28 MED ORDER — THIAMINE HCL 100 MG/ML IJ SOLN: 100.0000 mg | Freq: Every day | INTRAMUSCULAR | Status: DC

## 2023-04-28 MED ORDER — LORAZEPAM 1 MG PO TABS
1.0000 mg | ORAL_TABLET | ORAL | Status: AC | PRN
  Administered 2023-04-29: 1 mg via ORAL
  Administered 2023-04-30: 2 mg via ORAL
  Administered 2023-05-01: 1 mg via ORAL
  Filled 2023-04-28 (×2): qty 1

## 2023-04-28 MED ORDER — SODIUM CHLORIDE 0.9 % IV SOLN
INTRAVENOUS | Status: DC
  Administered 2023-04-28: 150 mL via INTRAVENOUS

## 2023-04-28 MED ORDER — ADULT MULTIVITAMIN W/MINERALS CH
1.0000 | ORAL_TABLET | Freq: Every day | ORAL | Status: DC
  Administered 2023-04-28 – 2023-04-29 (×2): 1 via ORAL
  Filled 2023-04-28 (×2): qty 1

## 2023-04-28 MED ORDER — DEXTROSE 5 % IV SOLN
12.5000 mg/kg/h | INTRAVENOUS | Status: AC
  Administered 2023-04-28: 12.5 mg/kg/h via INTRAVENOUS
  Filled 2023-04-28: qty 30

## 2023-04-28 MED ORDER — SENNA 8.6 MG PO TABS: 1.0000 | ORAL_TABLET | Freq: Every day | ORAL | Status: DC | PRN

## 2023-04-28 MED ORDER — SODIUM CHLORIDE 0.9 % IV BOLUS
2000.0000 mL | Freq: Once | INTRAVENOUS | Status: AC
  Administered 2023-04-28 (×2): 2000 mL via INTRAVENOUS

## 2023-04-28 MED ORDER — PROCHLORPERAZINE EDISYLATE 10 MG/2ML IJ SOLN
10.0000 mg | Freq: Four times a day (QID) | INTRAMUSCULAR | Status: DC | PRN
  Administered 2023-04-28 – 2023-05-04 (×5): 10 mg via INTRAVENOUS
  Filled 2023-04-28 (×5): qty 2

## 2023-04-28 NOTE — Consult Note (Signed)
 Consultation  Referring Provider:  [facility]  Primary Care Physician:  Patient, No Pcp Per Primary Gastroenterologist:  Dr. Adela Lank       Reason for Consultation:   Elevated LFTs  LOS: 1 day          HPI:   Jaylin Benzel is a 28 y.o. male with past medical history significant for depression, anxiety, substance use, presents for evaluation of elevated LFTs.  Majority of HPI is through chart review as patient was somnolent at the time of my evaluation and did not answer many questions.  He reportedly had been drinking alcohol heavily for 4 or 5 days (liquor) and had nausea/vomiting over the past 2 to 3 days.  Notes no further nausea and vomiting and no further abdominal pain.  Reported marijuana use but no other substances.  Upon arrival patient was hemodynamically stable but had labs notable for Serum CK 26,463 Undetectable acetaminophen level Hematuria AST greater than 7000, ALT 4942, total bilirubin 1, INR 1.5  Patient was admitted and given 2 L of saline, Zofran, Pepcid and GI was consulted for further evaluation.  Patient states something similar to this happened a few years ago to him.  Appears he was seen in 2022 by Dr. Adela Lank for coffee-ground emesis and melena after drinking alcohol.  At that time he had very mildly elevated LFTs with AST 50, ALT 31, T. bili 1.4.  He was set up for EGD 11/21/2020 which showed normal esophagus, normal stomach, normal duodenum.  Thought to have had esophagitis/gastritis versus Mallory-Weiss tear in setting of alcohol use that has since healed prior to endoscopic evaluation  PREVIOUS GI WORKUP   EGD 11/21/2020 for hematemesis - Normal esophagus, stomach, duodenum - Biopsies negative for H. pylori  Past Medical History:  Diagnosis Date   Anxiety    Attention deficit hyperactivity disorder (ADHD)    Boxers fracture 07/02/2011   Cast applied in the ED. Removed by Ortho.   Depression    Inguinal hernia    RIGHT   Oppositional defiant  disorder 04/19/2005   Borderline/ low average intelligence (Questionable)   Substance abuse (HCC)    Tinea capitis     Surgical History:  He  has a past surgical history that includes Hand surgery; Inguinal hernia repair (Right, 12/10/2014); Insertion of mesh (Right, 12/10/2014); and Hernia repair. Family History:  His family history includes Colon cancer in an other family member; Congestive Heart Failure in his maternal grandfather; Diabetes in his maternal grandfather and paternal grandfather; Eczema in his brother and another family member; Hypertension in his mother. Social History:   reports that he has been smoking cigarettes. He has a 4 pack-year smoking history. He has never used smokeless tobacco. He reports that he does not currently use alcohol. He reports current drug use. Frequency: 7.00 times per week. Drug: Marijuana.  Prior to Admission medications   Medication Sig Start Date End Date Taking? Authorizing Provider  doxycycline (VIBRAMYCIN) 100 MG capsule Take 1 capsule (100 mg total) by mouth 2 (two) times daily. 11/26/22   [name], PA-C  ondansetron (ZOFRAN ODT) 4 MG disintegrating tablet Take 1 tablet (4 mg total) by mouth every 8 (eight) hours as needed for nausea or vomiting. 11/12/20   Army Melia A, PA-C  ondansetron (ZOFRAN) 4 MG tablet Take 1 tablet (4 mg total) by mouth every 8 (eight) hours as needed for nausea or vomiting. 11/21/20   Armbruster, Willaim Rayas, MD  pantoprazole (PROTONIX) 20 MG tablet  Take 1 tablet (20 mg total) by mouth 2 (two) times daily. Note: Increase from once a day to TWICE a day 11/16/20 01/15/21  Armbruster, Willaim Rayas, MD    Current Facility-Administered Medications  Medication Dose Route Frequency Provider Last Rate Last Admin   0.9 %  sodium chloride infusion   Intravenous Continuous Luberta Robertson, Horatio Pel Tublu, MD       acetylcysteine (ACETADOTE) 18,000 mg in dextrose 5 % 590 mL (30.5085 mg/mL) infusion  6.25 mg/kg/hr Intravenous Continuous  Opyd, Lavone Neri, MD       folic acid (FOLVITE) tablet 1 mg  1 mg Oral Daily Opyd, Lavone Neri, MD       heparin injection 5,000 Units  5,000 Units Subcutaneous Q8H Opyd, Lavone Neri, MD       LORazepam (ATIVAN) tablet 1-4 mg  1-4 mg Oral Q1H PRN Opyd, Lavone Neri, MD       Or   LORazepam (ATIVAN) injection 1-4 mg  1-4 mg Intravenous Q1H PRN Opyd, Lavone Neri, MD   2 mg at 04/28/23 0452   LORazepam (ATIVAN) tablet 0-4 mg  0-4 mg Oral Q6H Opyd, Lavone Neri, MD       Followed by   Melene Muller ON 04/30/2023] LORazepam (ATIVAN) tablet 0-4 mg  0-4 mg Oral Q12H Opyd, Lavone Neri, MD       multivitamin with minerals tablet 1 tablet  1 tablet Oral Daily Opyd, Lavone Neri, MD       oxyCODONE (Oxy IR/ROXICODONE) immediate release tablet 5 mg  5 mg Oral Q4H PRN Opyd, Lavone Neri, MD       prochlorperazine (COMPAZINE) injection 5 mg  5 mg Intravenous Q6H PRN Opyd, Lavone Neri, MD   5 mg at 04/28/23 0155   senna (SENOKOT) tablet 8.6 mg  1 tablet Oral Daily PRN Opyd, Lavone Neri, MD       sodium bicarbonate 150 mEq in sterile water 1,150 mL infusion   Intravenous Continuous Opyd, Lavone Neri, MD 125 mL/hr at 04/28/23 0343 New Bag at 04/28/23 0343   sodium chloride flush (NS) 0.9 % injection 3 mL  3 mL Intravenous Q12H Opyd, Lavone Neri, MD       thiamine (VITAMIN B1) tablet 100 mg  100 mg Oral Daily Opyd, Lavone Neri, MD       Or   thiamine (VITAMIN B1) injection 100 mg  100 mg Intravenous Daily Opyd, Lavone Neri, MD       Current Outpatient Medications  Medication Sig Dispense Refill   doxycycline (VIBRAMYCIN) 100 MG capsule Take 1 capsule (100 mg total) by mouth 2 (two) times daily. 14 capsule 0   ondansetron (ZOFRAN ODT) 4 MG disintegrating tablet Take 1 tablet (4 mg total) by mouth every 8 (eight) hours as needed for nausea or vomiting. 12 tablet 0   ondansetron (ZOFRAN) 4 MG tablet Take 1 tablet (4 mg total) by mouth every 8 (eight) hours as needed for nausea or vomiting. 20 tablet 1   pantoprazole (PROTONIX) 20 MG tablet Take 1 tablet  (20 mg total) by mouth 2 (two) times daily. Note: Increase from once a day to TWICE a day 60 tablet 1    Allergies as of 04/27/2023   (No Known Allergies)    Review of Systems  Constitutional:  Negative for chills, fever and weight loss.  HENT:  Negative for hearing loss and tinnitus.   Eyes:  Negative for blurred vision and double vision.  Respiratory:  Negative for cough and hemoptysis.   Cardiovascular:  Negative for chest pain and palpitations.  Gastrointestinal:  Positive for abdominal pain, nausea and vomiting. Negative for blood in stool, constipation, diarrhea, heartburn and melena.  Genitourinary:  Negative for dysuria and urgency.  Musculoskeletal:  Negative for myalgias and neck pain.  Skin:  Negative for itching and rash.  Neurological:  Negative for seizures and loss of consciousness.  Psychiatric/Behavioral:  Negative for depression and suicidal ideas.        Physical Exam:  Vital signs in last 24 hours: Temp:  [97.9 F (36.6 C)-98.7 F (37.1 C)] 98 F (36.7 C) (03/09 0848) Pulse Rate:  [67-89] 67 (03/09 0806) Resp:  [13-26] 16 (03/09 0806) BP: (116-139)/(86-96) 128/93 (03/09 0806) SpO2:  [100 %] 100 % (03/09 0806) Weight:  [65.8 kg] 65.8 kg (03/08 1716)   Last BM recorded by nurses in past 5 days No data recorded  Physical Exam Constitutional:      Comments: Somnolent  HENT:     Head: Normocephalic and atraumatic.     Nose: Nose normal. No congestion.     Mouth/Throat:     Mouth: Mucous membranes are moist.     Pharynx: Oropharynx is clear.  Eyes:     General: No scleral icterus.    Extraocular Movements: Extraocular movements intact.  Cardiovascular:     Rate and Rhythm: Normal rate and regular rhythm.  Pulmonary:     Effort: Pulmonary effort is normal. No respiratory distress.  Abdominal:     General: Bowel sounds are normal. There is no distension.     Palpations: Abdomen is soft. There is no mass.     Tenderness: There is no abdominal  tenderness. There is no guarding or rebound.     Hernia: No hernia is present.  Musculoskeletal:     Cervical back: Normal range of motion and neck supple.  Skin:    General: Skin is warm and dry.  Neurological:     General: No focal deficit present.     Mental Status: He is oriented to person, place, and time.  Psychiatric:        Mood and Affect: Mood normal.        Behavior: Behavior normal.        Thought Content: Thought content normal.      LAB RESULTS: Recent Labs    04/27/23 1718 04/28/23 0447  WBC 19.5* 15.5*  HGB 14.8 12.0*  HCT 46.7 36.2*  PLT 220 180   BMET Recent Labs    04/27/23 1824 04/28/23 0447  NA 144 140  K 5.1 3.8  CL 107 104  CO2 17* 20*  GLUCOSE 86 123*  BUN 24* 31*  CREATININE 3.22* 4.50*  CALCIUM 6.6* 6.4*   LFT Recent Labs    04/28/23 0447  PROT 5.7*  ALBUMIN 3.0*  AST >7,000*  ALT 3,529*  ALKPHOS 69  BILITOT 1.5*   PT/INR Recent Labs    04/27/23 2154 04/28/23 0447  LABPROT 17.9* 18.6*  INR 1.5* 1.5*    STUDIES: US LIVER DOPPLER Result Date: 04/28/2023 CLINICAL DATA:  Elevated liver function tests EXAM: DUPLEX ULTRASOUND OF LIVER TECHNIQUE: Color and duplex Doppler ultrasound was performed to evaluate the hepatic in-flow and out-flow vessels. COMPARISON:  None Available. FINDINGS: Liver: Normal parenchymal echogenicity. Normal hepatic contour without nodularity. No focal lesion, mass or intrahepatic biliary ductal dilatation. Main Portal Vein size: 1.4 cm Portal Vein Velocities Main Prox:  30 cm/sec Main Mid: 21 cm/sec Main Dist:  29 cm/sec Right: 9 cm/sec Left: 17  cm/sec Hepatic Vein Velocities Right:  13 cm/sec Middle:  38 cm/sec Left:  20 cm/sec IVC: Present and patent with normal respiratory phasicity. Hepatic Artery Velocity:  50 cm/sec Splenic Vein Velocity:  15 cm/sec Spleen: 9.6 cm x 8.7 cm x 3.4 cm with a total volume of 150 cm^3 (411 cm^3 is upper limit normal) Portal Vein Occlusion/Thrombus: No Splenic Vein  Occlusion/Thrombus: No Ascites: None Varices: None IMPRESSION: 1. Normal hepatic Doppler examination. Electronically Signed   By: Helyn Numbers M.D.   On: 04/28/2023 01:45   US Abdomen Limited RUQ (LIVER/GB) Result Date: 04/27/2023 CLINICAL DATA:  Elevated liver function tests EXAM: ULTRASOUND ABDOMEN LIMITED RIGHT UPPER QUADRANT COMPARISON:  None Available. FINDINGS: Gallbladder: No intraluminal stones or sludge is identified. The gallbladder is not overtly distended. There is mild gallbladder wall thickening with gallbladder wall measuring 4-6 mm in thickness. There is pericholecystic fluid present best appreciated within the gallbladder fossa. The sonographic Eulah Pont sign is reportedly negative. Common bile duct: Diameter: 4 mm in mid diameter Liver: No focal lesion identified. Within normal limits in parenchymal echogenicity. Portal vein is patent on color Doppler imaging with normal direction of blood flow towards the liver. Other: Visualized mid body of the pancreas is unremarkable. IMPRESSION: 1. Mild gallbladder wall thickening, possibly related to nondistention of the gallbladder, and pericholecystic fluid. These findings are nonspecific and while these may be seen in the setting of acalculous cholecystitis, additional considerations such as inflammatory conditions of the liver or biliary tree or cardiogenic failure could appear similarly. If there is clinical concern for acalculus cholecystitis, hepatobiliary scintigraphy may be helpful for further evaluation. Electronically Signed   By: Helyn Numbers M.D.   On: 04/27/2023 23:18   US RENAL Result Date: 04/27/2023 CLINICAL DATA:  Acute renal insufficiency EXAM: RENAL / URINARY TRACT ULTRASOUND COMPLETE COMPARISON:  None Available. FINDINGS: Right Kidney: Renal measurements: 10.5 x 6.3 x 5.5 cm = volume: 187 mL. Renal cortical echogenicity is diffusely increased in keeping with changes of underlying medical renal disease. Cortical thickness is  preserved. No intrarenal masses or calcifications. No hydronephrosis. No perinephric fluid collections are seen. Left Kidney: Renal measurements: 10.9 x 5.5 x 5.4 cm = volume: 171 mL. Renal cortical echogenicity is diffusely increased in keeping with changes of underlying medical renal disease. Cortical thickness is preserved. No intrarenal masses or calcifications. No hydronephrosis. No perinephric fluid collections are seen. Bladder: Appears normal for degree of bladder distention. Other: None. IMPRESSION: 1. Diffusely increased renal cortical echogenicity in keeping with changes of underlying medical renal disease. Electronically Signed   By: Helyn Numbers M.D.   On: 04/27/2023 23:13   DG Foot Complete Right Result Date: 04/27/2023 CLINICAL DATA:  Right foot pain. EXAM: RIGHT FOOT COMPLETE - 3+ VIEW COMPARISON:  April 24, 2014 FINDINGS: There is a small area of cortical irregularity seen along the dorsal aspect of the talus. This is of indeterminate age and represents a new finding when compared to the prior study. There is no evidence of dislocation. There is no evidence of arthropathy or other focal bone abnormality. Soft tissues are unremarkable. IMPRESSION: Findings along the dorsal aspect of the right talus which may represent a small avulsion fracture of indeterminate age. Correlation with physical examination is recommended to determine the presence of point tenderness. Electronically Signed   By: Aram Candela M.D.   On: 04/27/2023 19:43      Impression    Elevated LFTs/acute liver injury likely secondary to rhabdomyolysis AST > 7,000, ALT 3,529,  alk phos 69, total bilirubin 1.5 BUN 31, CR 4.50, GFR 18 ASMA, AMA, ANA, ceruloplasmin, immunoglobulins pending Negative viral hepatitis panel PT 18.6, INR 1.5 CK 32,693 Normal liver Doppler Suspect liver enzymes elevated in the setting of rhabdomyolysis with extensive alcohol use and associated AKI.  Multiple labs pending but liver Doppler is  normal which rules out Budd-Chiari.  Normal acetaminophen level.  No reported new meds.  Negative hepatitis panel  AKI  Alcohol use disorder   Plan   - Continue to wait for pending labs - Continue NAC - Continue to trend LFTs - Continue supportive care and IVF - Monitor for withdrawal with CIWA  Thank you for your kind consultation, we will continue to follow.   Leith Hedlund Leanna Sato  04/28/2023, 9:26 AM

## 2023-04-28 NOTE — Plan of Care (Signed)

## 2023-04-28 NOTE — Progress Notes (Signed)
 PROGRESS NOTE    Andrew Page  YQM:[id number]  DOB: 12/25/1995  DOA: 04/27/2023 PCP: Patient, No Pcp Per Outpatient Specialists:   Hospital course:  28 year old with history of anxiety and depression, EtOH and polysubstance use was admitted yesterday with nausea and vomiting after 5 days of alcohol binging.  Workup revealed rhabdomyolysis and AKI with CK of 26,000 and creatinine 3.2.  Patient was also noted to have elevated transaminases with AST greater than 7000 ALT around 5000 with INR of 1.5 and bili of 1.5.  Tylenol level was undetectable however he was started on NAC.   Subjective:  Patient states he is very sleepy, has not slept in a while.  Knows he is in the hospital.  Notes that he has got liver problems and kidney problems.  States that he only ever smokes weed and maybe had 1 Percocet tablet but nothing else.    Objective: Vitals:   04/28/23 0848 04/28/23 1212 04/28/23 1214 04/28/23 1324  BP:  (!) 129/96 (!) 126/96 (!) 133/101  Pulse:  64 (!) 54 (!) 56  Resp:   18 18  Temp: 98 F (36.7 C)   98.4 F (36.9 C)  TempSrc: Oral   Oral  SpO2:   100% 99%  Weight:      Height:        Intake/Output Summary (Last 24 hours) at 04/28/2023 1548 Last data filed at 04/28/2023 0956 Gross per 24 hour  Intake 1168.3 ml  Output --  Net 1168.3 ml   Filed Weights   04/27/23 1716  Weight: 65.8 kg     Exam:  General: Sleepy but reasonably well-appearing man lying in bed NAD Eyes: sclera anicteric, conjuctiva mild injection bilaterally CVS: S1-S2, regular  Respiratory:  decreased air entry bilaterally secondary to decreased inspiratory effort, rales at bases  GI: NABS, soft, NT  LE: Warm and well-perfused Neuro: A/O x 3,  grossly nonfocal, sleepy but easily arousable by voice alone and coherent 1 awake.  Data Reviewed:  Basic Metabolic Panel: Recent Labs  Lab 04/27/23 1824 04/28/23 0447  NA 144 140  K 5.1 3.8  CL 107 104  CO2 17* 20*  GLUCOSE 86 123*  BUN 24*  31*  CREATININE 3.22* 4.50*  CALCIUM 6.6* 6.4*  MG 2.0 1.7  PHOS  --  3.3    CBC: Recent Labs  Lab 04/27/23 1718 04/28/23 0447  WBC 19.5* 15.5*  NEUTROABS 17.0*  --   HGB 14.8 12.0*  HCT 46.7 36.2*  MCV 97.9 94.5  PLT 220 180     Scheduled Meds:  calcium carbonate  1 tablet Oral BID WC   folic acid  1 mg Oral Daily   heparin  5,000 Units Subcutaneous Q8H   LORazepam  0-4 mg Oral Q6H   Followed by   Melene Muller ON 04/30/2023] LORazepam  0-4 mg Oral Q12H   multivitamin with minerals  1 tablet Oral Daily   sodium chloride flush  3 mL Intravenous Q12H   thiamine  100 mg Oral Daily   Or   thiamine  100 mg Intravenous Daily   Continuous Infusions:  sodium chloride     acetylcysteine 6.25 mg/kg/hr (04/28/23 0956)   sodium bicarbonate 150 mEq in sterile water 1,150 mL infusion 125 mL/hr at 04/28/23 0343   sodium chloride       Assessment & Plan:   Rhabdomyolysis AKI Patient CPK has increased overnight from 26K up to 32K this morning Creatinine has gone from 3.2 to 4.5 Patient states  he has not urinated for over 24 hours probably longer Of note patient has received about 4 L of fluid since admission Will provide another 2 L bolus now and start patient at 250 cc normal saline mL in hopes that he makes urine. Discussed with Dr. Chales Abrahams, no need to alkalize urine at present Nephrology can be formally consulted if patient remains anuric with worsened creatinine despite adequate hydration No evidence of fluid overload at present, patient is 27 and should be able to tolerate large fluid doses which he needs  Abnormal LFTs Patient thought to have primary liver injury on admission yesterday Discussed with Dr. Fritzi Mandes, he feels this is not likely to be primary liver disease, transaminases are elevated likely secondary to his significant rhabdomyolysis. No further liver workup is warranted at present. Will discontinue NAC  Hypocalcemia Calcium corrected to 7.2 Ionized calcium  and PTH ordered No evidence of neuromuscular dysfunction EKG without QT prolongation Will start oral repletion per pharmacy recommendations  Alxoholism CIWA protocol MVI, folate  Avulsion fracture right ankle Supportive care, postop shoe per orthopedics    DVT prophylaxis: sq heparin Code Status: Full Family Communication: spoke with mother      Studies: US LIVER DOPPLER Result Date: 04/28/2023 CLINICAL DATA:  Elevated liver function tests EXAM: DUPLEX ULTRASOUND OF LIVER TECHNIQUE: Color and duplex Doppler ultrasound was performed to evaluate the hepatic in-flow and out-flow vessels. COMPARISON:  None Available. FINDINGS: Liver: Normal parenchymal echogenicity. Normal hepatic contour without nodularity. No focal lesion, mass or intrahepatic biliary ductal dilatation. Main Portal Vein size: 1.4 cm Portal Vein Velocities Main Prox:  30 cm/sec Main Mid: 21 cm/sec Main Dist:  29 cm/sec Right: 9 cm/sec Left: 17 cm/sec Hepatic Vein Velocities Right:  13 cm/sec Middle:  38 cm/sec Left:  20 cm/sec IVC: Present and patent with normal respiratory phasicity. Hepatic Artery Velocity:  50 cm/sec Splenic Vein Velocity:  15 cm/sec Spleen: 9.6 cm x 8.7 cm x 3.4 cm with a total volume of 150 cm^3 (411 cm^3 is upper limit normal) Portal Vein Occlusion/Thrombus: No Splenic Vein Occlusion/Thrombus: No Ascites: None Varices: None IMPRESSION: 1. Normal hepatic Doppler examination. Electronically Signed   By: Helyn Numbers M.D.   On: 04/28/2023 01:45   US Abdomen Limited RUQ (LIVER/GB) Result Date: 04/27/2023 CLINICAL DATA:  Elevated liver function tests EXAM: ULTRASOUND ABDOMEN LIMITED RIGHT UPPER QUADRANT COMPARISON:  None Available. FINDINGS: Gallbladder: No intraluminal stones or sludge is identified. The gallbladder is not overtly distended. There is mild gallbladder wall thickening with gallbladder wall measuring 4-6 mm in thickness. There is pericholecystic fluid present best appreciated within the  gallbladder fossa. The sonographic Eulah Pont sign is reportedly negative. Common bile duct: Diameter: 4 mm in mid diameter Liver: No focal lesion identified. Within normal limits in parenchymal echogenicity. Portal vein is patent on color Doppler imaging with normal direction of blood flow towards the liver. Other: Visualized mid body of the pancreas is unremarkable. IMPRESSION: 1. Mild gallbladder wall thickening, possibly related to nondistention of the gallbladder, and pericholecystic fluid. These findings are nonspecific and while these may be seen in the setting of acalculous cholecystitis, additional considerations such as inflammatory conditions of the liver or biliary tree or cardiogenic failure could appear similarly. If there is clinical concern for acalculus cholecystitis, hepatobiliary scintigraphy may be helpful for further evaluation. Electronically Signed   By: Helyn Numbers M.D.   On: 04/27/2023 23:18   US RENAL Result Date: 04/27/2023 CLINICAL DATA:  Acute renal insufficiency EXAM: RENAL / URINARY  TRACT ULTRASOUND COMPLETE COMPARISON:  None Available. FINDINGS: Right Kidney: Renal measurements: 10.5 x 6.3 x 5.5 cm = volume: 187 mL. Renal cortical echogenicity is diffusely increased in keeping with changes of underlying medical renal disease. Cortical thickness is preserved. No intrarenal masses or calcifications. No hydronephrosis. No perinephric fluid collections are seen. Left Kidney: Renal measurements: 10.9 x 5.5 x 5.4 cm = volume: 171 mL. Renal cortical echogenicity is diffusely increased in keeping with changes of underlying medical renal disease. Cortical thickness is preserved. No intrarenal masses or calcifications. No hydronephrosis. No perinephric fluid collections are seen. Bladder: Appears normal for degree of bladder distention. Other: None. IMPRESSION: 1. Diffusely increased renal cortical echogenicity in keeping with changes of underlying medical renal disease. Electronically Signed    By: Helyn Numbers M.D.   On: 04/27/2023 23:13   DG Foot Complete Right Result Date: 04/27/2023 CLINICAL DATA:  Right foot pain. EXAM: RIGHT FOOT COMPLETE - 3+ VIEW COMPARISON:  April 24, 2014 FINDINGS: There is a small area of cortical irregularity seen along the dorsal aspect of the talus. This is of indeterminate age and represents a new finding when compared to the prior study. There is no evidence of dislocation. There is no evidence of arthropathy or other focal bone abnormality. Soft tissues are unremarkable. IMPRESSION: Findings along the dorsal aspect of the right talus which may represent a small avulsion fracture of indeterminate age. Correlation with physical examination is recommended to determine the presence of point tenderness. Electronically Signed   By: Aram Candela M.D.   On: 04/27/2023 19:43    Principal Problem:   Elevated LFTs Active Problems:   Alcohol abuse with alcohol-induced mood disorder (HCC)   AKI (acute kidney injury) (HCC)   Rhabdomyolysis   Avulsion fracture of right talus   Metabolic acidosis     Abyan Cadman Orma Flaming, [facility]  If 7PM-7AM, please contact night-coverage [email or web address]   LOS: 1 day

## 2023-04-28 NOTE — Progress Notes (Signed)
 Patient vomited. PRN compazine given.

## 2023-04-28 NOTE — H&P (Addendum)
 History and Physical    Andrew Page XBM:[id number] DOB: July 23, 1995 DOA: 04/27/2023  PCP: Patient, No Pcp Per   Patient coming from: Home   Chief Complaint: N/V   HPI: Andrew Page is a 28 y.o. male with medical history significant for depression, anxiety, and substance abuse who presents with nausea and vomiting after an alcohol binge.  Patient was drinking alcohol heavily for 4 or 5 days and has had nausea and vomiting for the past 2 or 3 days.  His last drink was this morning.  He denies any abdominal pain associated with this.  He acknowledges using marijuana but denies any other recent substance use.  He also notes swelling of the right ankle but does not remember any trauma or inciting event (though he acknowledges he has been heavily intoxicated for several days).  ED Course: Upon arrival to the ED, patient is found to be afebrile and saturating well on room air with normal heart rate and stable blood pressure.  Labs are most notable for bicarbonate 17, creatinine 3.22, anion gap 20, AST >7000, ALT 4942, total bilirubin 1.5, INR 1.5, normal lipase, undetectable acetaminophen level, and serum CK of 26,463.  There is a large " hemoglobin" on urine dipstick with 6-10 RBC/hpf on microscopy.  Gastroenterology was consulted by the ED physician and the patient was given 2 L of saline, Zofran, and Pepcid in the ED.  Review of Systems:  All other systems reviewed and apart from HPI, are negative.  Past Medical History:  Diagnosis Date   Anxiety    Attention deficit hyperactivity disorder (ADHD)    Boxers fracture 07/02/2011   Cast applied in the ED. Removed by Ortho.   Depression    Inguinal hernia    RIGHT   Oppositional defiant disorder 04/19/2005   Borderline/ low average intelligence (Questionable)   Substance abuse (HCC)    Tinea capitis     Past Surgical History:  Procedure Laterality Date   HAND SURGERY     HERNIA REPAIR     INGUINAL HERNIA REPAIR Right 12/10/2014    Procedure: LAPAROSCOPIC RIGHT INGUINAL HERNIA REPAIR WITH MESH;  Surgeon: Axel Filler, MD;  Location: [facility];  Service: General;  Laterality: Right;   INSERTION OF MESH Right 12/10/2014   Procedure: INSERTION OF MESH;  Surgeon: Axel Filler, MD;  Location: [facility];  Service: General;  Laterality: Right;    Social History:   reports that he has been smoking cigarettes. He has a 4 pack-year smoking history. He has never used smokeless tobacco. He reports that he does not currently use alcohol. He reports current drug use. Frequency: 7.00 times per week. Drug: Marijuana.  No Known Allergies  Family History  Problem Relation Age of Onset   Hypertension Mother    Eczema Brother        Twin   Diabetes Maternal Grandfather    Congestive Heart Failure Maternal Grandfather    Diabetes Paternal Grandfather    Colon cancer Other    Eczema Other        elbows, behind the knees, worse in the summer. Previously treated with triamcinolonce 0.1% cream with sucess.   Rectal cancer Neg Hx    Stomach cancer Neg Hx      Prior to Admission medications   Medication Sig Start Date End Date Taking? Authorizing Provider  doxycycline (VIBRAMYCIN) 100 MG capsule Take 1 capsule (100 mg total) by mouth 2 (two) times daily. 11/26/22   [name], PA-C  ondansetron (ZOFRAN ODT) 4  MG disintegrating tablet Take 1 tablet (4 mg total) by mouth every 8 (eight) hours as needed for nausea or vomiting. 11/12/20   Army Melia A, PA-C  ondansetron (ZOFRAN) 4 MG tablet Take 1 tablet (4 mg total) by mouth every 8 (eight) hours as needed for nausea or vomiting. 11/21/20   Armbruster, Willaim Rayas, MD  pantoprazole (PROTONIX) 20 MG tablet Take 1 tablet (20 mg total) by mouth 2 (two) times daily. Note: Increase from once a day to TWICE a day 11/16/20 01/15/21  Benancio Deeds, MD    Physical Exam: Vitals:   04/27/23 1845 04/27/23 2001 04/27/23 2300 04/28/23 0016  BP: (!) 125/93 117/86 127/86   Pulse: 87 87     Resp: 13 17    Temp:  97.9 F (36.6 C)  98.7 F (37.1 C)  TempSrc:  Oral  Oral  SpO2: 100% 100%    Weight:      Height:        Constitutional: NAD, no pallor or diaphoresis   Eyes: PERTLA, lids and conjunctivae normal ENMT: Mucous membranes are moist. Posterior pharynx clear of any exudate or lesions.   Neck: supple, no masses  Respiratory: no wheezing, no crackles. No accessory muscle use.  Cardiovascular: S1 & S2 heard, regular rate and rhythm. No extremity edema aside from mild right ankle swelling.   Abdomen: soft, tender in RUQ, no guarding. Bowel sounds active.  Musculoskeletal: no clubbing / cyanosis. No joint deformity upper and lower extremities.   Skin: no significant rashes, lesions, ulcers. Warm, dry, well-perfused. Neurologic: CN 2-12 grossly intact. Moving all extremities. Alert and fully oriented.  Psychiatric: Calm. Cooperative.    Labs and Imaging on Admission: I have personally reviewed following labs and imaging studies  CBC: Recent Labs  Lab 04/27/23 1718  WBC 19.5*  NEUTROABS 17.0*  HGB 14.8  HCT 46.7  MCV 97.9  PLT 220   Basic Metabolic Panel: Recent Labs  Lab 04/27/23 1824  NA 144  K 5.1  CL 107  CO2 17*  GLUCOSE 86  BUN 24*  CREATININE 3.22*  CALCIUM 6.6*  MG 2.0   GFR: Estimated Creatinine Clearance: 32.6 mL/min (A) (by C-G formula based on SCr of 3.22 mg/dL (H)). Liver Function Tests: Recent Labs  Lab 04/27/23 1824  AST >7,000*  ALT 4,942*  ALKPHOS 87  BILITOT 1.5*  PROT 6.6  ALBUMIN 3.7   Recent Labs  Lab 04/27/23 1824  LIPASE 27   No results for input(s): "AMMONIA" in the last 168 hours. Coagulation Profile: Recent Labs  Lab 04/27/23 2154  INR 1.5*   Cardiac Enzymes: Recent Labs  Lab 04/27/23 2154  CKTOTAL 26,463*   BNP (last 3 results) No results for input(s): "PROBNP" in the last 8760 hours. HbA1C: No results for input(s): "HGBA1C" in the last 72 hours. CBG: No results for input(s): "GLUCAP" in the  last 168 hours. Lipid Profile: No results for input(s): "CHOL", "HDL", "LDLCALC", "TRIG", "CHOLHDL", "LDLDIRECT" in the last 72 hours. Thyroid Function Tests: No results for input(s): "TSH", "T4TOTAL", "FREET4", "T3FREE", "THYROIDAB" in the last 72 hours. Anemia Panel: No results for input(s): "VITAMINB12", "FOLATE", "FERRITIN", "TIBC", "IRON", "RETICCTPCT" in the last 72 hours. Urine analysis:    Component Value Date/Time   COLORURINE AMBER (A) 04/27/2023 2322   APPEARANCEUR CLOUDY (A) 04/27/2023 2322   LABSPEC 1.011 04/27/2023 2322   PHURINE 5.0 04/27/2023 2322   GLUCOSEU 50 (A) 04/27/2023 2322   HGBUR LARGE (A) 04/27/2023 2322   BILIRUBINUR NEGATIVE 04/27/2023  2322   KETONESUR NEGATIVE 04/27/2023 2322   PROTEINUR 100 (A) 04/27/2023 2322   UROBILINOGEN 1.0 06/06/2014 2329   NITRITE NEGATIVE 04/27/2023 2322   LEUKOCYTESUR NEGATIVE 04/27/2023 2322   Sepsis Labs: @LABRCNTIP (procalcitonin:4,lacticidven:4) )No results found for this or any previous visit (from the past 240 hours).   Radiological Exams on Admission: US Abdomen Limited RUQ (LIVER/GB) Result Date: 04/27/2023 CLINICAL DATA:  Elevated liver function tests EXAM: ULTRASOUND ABDOMEN LIMITED RIGHT UPPER QUADRANT COMPARISON:  None Available. FINDINGS: Gallbladder: No intraluminal stones or sludge is identified. The gallbladder is not overtly distended. There is mild gallbladder wall thickening with gallbladder wall measuring 4-6 mm in thickness. There is pericholecystic fluid present best appreciated within the gallbladder fossa. The sonographic Eulah Pont sign is reportedly negative. Common bile duct: Diameter: 4 mm in mid diameter Liver: No focal lesion identified. Within normal limits in parenchymal echogenicity. Portal vein is patent on color Doppler imaging with normal direction of blood flow towards the liver. Other: Visualized mid body of the pancreas is unremarkable. IMPRESSION: 1. Mild gallbladder wall thickening, possibly  related to nondistention of the gallbladder, and pericholecystic fluid. These findings are nonspecific and while these may be seen in the setting of acalculous cholecystitis, additional considerations such as inflammatory conditions of the liver or biliary tree or cardiogenic failure could appear similarly. If there is clinical concern for acalculus cholecystitis, hepatobiliary scintigraphy may be helpful for further evaluation. Electronically Signed   By: Helyn Numbers M.D.   On: 04/27/2023 23:18   US RENAL Result Date: 04/27/2023 CLINICAL DATA:  Acute renal insufficiency EXAM: RENAL / URINARY TRACT ULTRASOUND COMPLETE COMPARISON:  None Available. FINDINGS: Right Kidney: Renal measurements: 10.5 x 6.3 x 5.5 cm = volume: 187 mL. Renal cortical echogenicity is diffusely increased in keeping with changes of underlying medical renal disease. Cortical thickness is preserved. No intrarenal masses or calcifications. No hydronephrosis. No perinephric fluid collections are seen. Left Kidney: Renal measurements: 10.9 x 5.5 x 5.4 cm = volume: 171 mL. Renal cortical echogenicity is diffusely increased in keeping with changes of underlying medical renal disease. Cortical thickness is preserved. No intrarenal masses or calcifications. No hydronephrosis. No perinephric fluid collections are seen. Bladder: Appears normal for degree of bladder distention. Other: None. IMPRESSION: 1. Diffusely increased renal cortical echogenicity in keeping with changes of underlying medical renal disease. Electronically Signed   By: Helyn Numbers M.D.   On: 04/27/2023 23:13   DG Foot Complete Right Result Date: 04/27/2023 CLINICAL DATA:  Right foot pain. EXAM: RIGHT FOOT COMPLETE - 3+ VIEW COMPARISON:  April 24, 2014 FINDINGS: There is a small area of cortical irregularity seen along the dorsal aspect of the talus. This is of indeterminate age and represents a new finding when compared to the prior study. There is no evidence of dislocation.  There is no evidence of arthropathy or other focal bone abnormality. Soft tissues are unremarkable. IMPRESSION: Findings along the dorsal aspect of the right talus which may represent a small avulsion fracture of indeterminate age. Correlation with physical examination is recommended to determine the presence of point tenderness. Electronically Signed   By: Aram Candela M.D.   On: 04/27/2023 19:43    EKG: Independently reviewed. Sinus rhythm.   Assessment/Plan   1. Acute liver injury  - Suspected secondary to rhabdomyolysis  - INR is 1.5, mental status normal on admission  - Liver US with Doppler, ceruloplasmin, and autoimmune serologies pending  - NAC started with pharmacy assistance  - Continue NAC, trend labs,  monitor mental status closely   2. Rhabdomyolysis; AKI; metabolic acidosis  - Continue IVF hydration, renally-dose medications, trend labs    3. Alcoholism  - Monitor for withdrawal with CIWA, use Ativan if needed, supplement vitamins   4. Right ankle swelling  - Possible small avulsion fracture at dorsal aspect of talus noted on plain radiographs  - Supportive care, outpatient follow-up     DVT prophylaxis: sq heparin  Code Status: Full  Level of Care: Level of care: Telemetry Medical Family Communication: Mother at bedside  Disposition Plan:  Patient is from: home  Anticipated d/c is to: TBD Anticipated d/c date is: 05/01/23  Patient currently: Pending improved renal and liver function  Consults called: GI   Admission status: Inpatient     Briscoe Deutscher, MD [facility]  04/28/2023, 12:56 AM

## 2023-04-28 NOTE — Progress Notes (Signed)
 Spoke to patient's mother, per patient request. Update given regarding plan of care.

## 2023-04-28 NOTE — Plan of Care (Signed)

## 2023-04-28 NOTE — Progress Notes (Signed)
 Orthopedic Tech Progress Note Patient Details:  Costantino Kohlbeck 1995/12/05 [id number]  Patient ID: Forestine Chute, male   DOB: 09/20/1995, 28 y.o.   MRN: [id number] Per  report from night shift ortho tech, RN requested post op shoe to not be applied due to patient sleeping. POS is at bedside for application once patient wakes. Darleen Crocker 04/28/2023, 7:09 AM

## 2023-04-28 NOTE — Progress Notes (Signed)
 Bladder scanned x3. Only 4mL in bladder after 1L bolus of 0.9NS. Patient coughing more than when he first arrived to unit. Diminished lung sounds in the bases. Patient states that he feels more short of breath now. All IV fluid stopped. BP 153/104. O2 sats 100% HR 58. Dr. Luberta Robertson paged.   34- Dr. Luberta Robertson called this RN back. All IV fluids to remain off until cxr is obtained and read. MD coming on tonight will determine what to do as far as fluids once cxr obtained.

## 2023-04-29 ENCOUNTER — Other Ambulatory Visit (HOSPITAL_COMMUNITY): Payer: Self-pay

## 2023-04-29 ENCOUNTER — Telehealth: Payer: Self-pay | Admitting: *Deleted

## 2023-04-29 LAB — CBC
HCT: 35.6 % — ABNORMAL LOW (ref 39.0–52.0)
Hemoglobin: 12.4 g/dL — ABNORMAL LOW (ref 13.0–17.0)
MCH: 31.2 pg (ref 26.0–34.0)
MCHC: 34.8 g/dL (ref 30.0–36.0)
MCV: 89.4 fL (ref 80.0–100.0)
Platelets: 143 10*3/uL — ABNORMAL LOW (ref 150–400)
RBC: 3.98 MIL/uL — ABNORMAL LOW (ref 4.22–5.81)
RDW: 13.4 % (ref 11.5–15.5)
WBC: 12 10*3/uL — ABNORMAL HIGH (ref 4.0–10.5)
nRBC: 0.3 % — ABNORMAL HIGH (ref 0.0–0.2)

## 2023-04-29 LAB — COMPREHENSIVE METABOLIC PANEL
ALT: 1983 U/L — ABNORMAL HIGH (ref 0–44)
AST: 2042 U/L — ABNORMAL HIGH (ref 15–41)
Albumin: 2.9 g/dL — ABNORMAL LOW (ref 3.5–5.0)
Alkaline Phosphatase: 74 U/L (ref 38–126)
Anion gap: 18 — ABNORMAL HIGH (ref 5–15)
BUN: 40 mg/dL — ABNORMAL HIGH (ref 6–20)
CO2: 18 mmol/L — ABNORMAL LOW (ref 22–32)
Calcium: 7.9 mg/dL — ABNORMAL LOW (ref 8.9–10.3)
Chloride: 102 mmol/L (ref 98–111)
Creatinine, Ser: 8.16 mg/dL — ABNORMAL HIGH (ref 0.61–1.24)
GFR, Estimated: 9 mL/min — ABNORMAL LOW (ref >60)
Glucose, Bld: 86 mg/dL (ref 70–99)
Potassium: 3.4 mmol/L — ABNORMAL LOW (ref 3.5–5.1)
Sodium: 138 mmol/L (ref 135–145)
Total Bilirubin: 2.4 mg/dL — ABNORMAL HIGH (ref 0.0–1.2)
Total Protein: 5.3 g/dL — ABNORMAL LOW (ref 6.5–8.1)

## 2023-04-29 LAB — IRON AND TIBC
Iron: 58 ug/dL (ref 45–182)
Saturation Ratios: 22 % (ref 17.9–39.5)
TIBC: 259 ug/dL (ref 250–450)
UIBC: 201 ug/dL

## 2023-04-29 LAB — PROTIME-INR
INR: 1.3 — ABNORMAL HIGH (ref 0.8–1.2)
Prothrombin Time: 16.7 s — ABNORMAL HIGH (ref 11.4–15.2)

## 2023-04-29 LAB — ANTI-MICROSOMAL ANTIBODY LIVER / KIDNEY: LKM1 Ab: 0.5 U (ref 0.0–20.0)

## 2023-04-29 LAB — FOLATE: Folate: >40 ng/mL (ref >5.9)

## 2023-04-29 LAB — GLUCOSE, CAPILLARY: Glucose-Capillary: 85 mg/dL (ref 70–99)

## 2023-04-29 LAB — CK: Total CK: 24888 U/L — ABNORMAL HIGH (ref 49–397)

## 2023-04-29 LAB — ANTI-SMOOTH MUSCLE ANTIBODY, IGG: F-Actin IgG: 3 U (ref 0–19)

## 2023-04-29 LAB — VITAMIN D 25 HYDROXY (VIT D DEFICIENCY, FRACTURES): Vit D, 25-Hydroxy: 4.59 ng/mL — ABNORMAL LOW (ref 30–100)

## 2023-04-29 LAB — VITAMIN B12: Vitamin B-12: 4105 pg/mL — ABNORMAL HIGH (ref 180–914)

## 2023-04-29 LAB — ANA: Anti Nuclear Antibody (ANA): NEGATIVE

## 2023-04-29 LAB — CERULOPLASMIN: Ceruloplasmin: 20.1 mg/dL (ref 16.0–31.0)

## 2023-04-29 LAB — CALCIUM, IONIZED: Calcium, Ionized, Serum: 4.2 mg/dL — ABNORMAL LOW (ref 4.5–5.6)

## 2023-04-29 MED ORDER — VITAMIN D (ERGOCALCIFEROL) 1.25 MG (50000 UNIT) PO CAPS
50000.0000 [IU] | ORAL_CAPSULE | ORAL | Status: DC
  Administered 2023-04-29 – 2023-05-06 (×2): 50000 [IU] via ORAL
  Filled 2023-04-29 (×3): qty 1

## 2023-04-29 NOTE — Telephone Encounter (Signed)
 Copied from CRM [id number]. Topic: General - Other >> Apr 29, 2023  3:58 PM Dominique A wrote: Reason for CRM: Patient grandmother [employer] is calling to apply for the [facility] card for her grandson Andrew Page. Please call [employer] back at [phone].

## 2023-04-29 NOTE — Consult Note (Addendum)
  [facility] Nephrology Consultation Note  Requesting MD: Gillis Santa Reason for consult: AKI  HPI:  Andrew Page is a 28 y.o. male. PMH of depression, anxiety and substance use including alcohol use.   Presents with n/v after alcohol binge. Reports heavy alcohol use for 4-5 days with subsequent nausea and vomiting for past 2-3 days leading to admission. Last drink was day of admission. Reports hx of alcohol use for about 6 years, reports drinking a fifth along with shots at times. Admitted for acute liver injury and AKI in setting of rhabdomyolysis.   Labs notable for Scr of 3.22, liver injury with AST >7000 and ALT 4900, CK 26k with UA supportive of rhabdomyolysis.   Nephrology consulted for worsening AKI. States was laying on ground for about a day after drinking. States minimal urine output for past 2 days and recent bladder scan volume of 0 per RN.   PMHx: Past Medical History:  Diagnosis Date   Anxiety    Attention deficit hyperactivity disorder (ADHD)    Boxers fracture 07/02/2011   Cast applied in the ED. Removed by Ortho.   Depression    Inguinal hernia    RIGHT   Oppositional defiant disorder 04/19/2005   Borderline/ low average intelligence (Questionable)   Substance abuse (HCC)    Tinea capitis     Past Surgical History:  Procedure Laterality Date   HAND SURGERY     HERNIA REPAIR     INGUINAL HERNIA REPAIR Right 12/10/2014   Procedure: LAPAROSCOPIC RIGHT INGUINAL HERNIA REPAIR WITH MESH;  Surgeon: Axel Filler, MD;  Location: [facility];  Service: General;  Laterality: Right;   INSERTION OF MESH Right 12/10/2014   Procedure: INSERTION OF MESH;  Surgeon: Axel Filler, MD;  Location: [facility];  Service: General;  Laterality: Right;    Family Hx:  Family History  Problem Relation Age of Onset   Hypertension Mother    Eczema Brother        Twin   Diabetes Maternal Grandfather    Congestive Heart Failure Maternal Grandfather    Diabetes  Paternal Grandfather    Colon cancer Other    Eczema Other        elbows, behind the knees, worse in the summer. Previously treated with triamcinolonce 0.1% cream with sucess.   Rectal cancer Neg Hx    Stomach cancer Neg Hx     Social History:  reports that he has been smoking cigarettes. He has a 4 pack-year smoking history. He has never used smokeless tobacco. He reports that he does not currently use alcohol. He reports current drug use. Frequency: 7.00 times per week. Drug: Marijuana.  Allergies: No Known Allergies  Medications: Prior to Admission medications   Not on File    I have reviewed the patient's current medications.  Labs: Renal Panel: Recent Labs  Lab 04/27/23 1824 04/28/23 0447 04/29/23 0644  NA 144 140 138  K 5.1 3.8 3.4*  CL 107 104 102  CO2 17* 20* 18*  GLUCOSE 86 123* 86  BUN 24* 31* 40*  CREATININE 3.22* 4.50* 8.16*  CALCIUM 6.6* 6.4* 7.9*  MG 2.0 1.7  --   PHOS  --  3.3  --      CBC:    Latest Ref Rng & Units 04/29/2023    6:44 AM 04/28/2023    4:47 AM 04/27/2023    5:18 PM  CBC  WBC 4.0 - 10.5 K/uL 12.0  15.5  19.5   Hemoglobin 13.0 -  17.0 g/dL 16.1  09.6  04.5   Hematocrit 39.0 - 52.0 % 35.6  36.2  46.7   Platelets 150 - 400 K/uL 143  180  220      Anemia Panel:  Recent Labs    04/27/23 1718 04/28/23 0447 04/29/23 0644  HGB 14.8 12.0* 12.4*  MCV 97.9 94.5 89.4    Recent Labs  Lab 04/27/23 1824 04/28/23 0447 04/29/23 0644  AST >7,000* >7,000* 2,042*  ALT 4,942* 3,529* 1,983*  ALKPHOS 87 69 74  BILITOT 1.5* 1.5* 2.4*  PROT 6.6 5.7* 5.3*  ALBUMIN 3.7 3.0* 2.9*    No results found for: "HGBA1C"  ROS:  Pertinent items are noted in HPI.  Physical Exam: Vitals:   04/29/23 0411 04/29/23 0753  BP: (!) 158/111 (!) 154/108  Pulse: 80 78  Resp: 19 18  Temp: 98 F (36.7 C) 99.6 F (37.6 C)  SpO2: 99% 97%     General exam: alert, appears calm and comfortable, no acute distress Respiratory system: Clear to auscultation.  Respiratory effort normal.  Cardiovascular system: RRR. No murmur appreciated.  Gastrointestinal system: Abdomen is nondistended, soft and nontender. Normal bowel sounds heard. Central nervous system: Alert and oriented. No focal neurological deficits. Extremities: No LE edema. Skin: No rashes, lesions or ulcers   Assessment/Plan:  # AKI in setting of rhabdomyolysis  Baseline from 2022 was normal at 1.24. No hx of kidney disease. Admit Scr 3.22. Started on IVF given rhabdomyolysis. UA noted for large Hgb, proteinuria. Negative hepatitis panel, HIV. UDS showed THC. FeNa <1%, pre-renal, with admission urine sodium and creatinine of 38 and 178.  -Scr worsening to 8.16 despite IVF  -remains oliguric despite aggressive IVF resuscitation, recent bladder scan with 0 -renal u/s showed increased renal echogenicity with medical renal disease -strict I&Os  -trend renal function panel  -pending ceruloplasmin, immunoglobulins  -avoid nephrotoxic agents  -discussed worsening renal function with patient and patient's mother, concern for need for dialysis  -will be NPO at [facility], assess renal function tomorrow AM, consideration of IR catheter and dialysis if worsening   # Rhabdomyolysis  CK on admit 26,463, increased to 32k now down to 24k.  -currently on NS at 250 ml/hr, consider change to LR?  -trend CK level   # Normocytic anemia: iron panel unremarkable, pending folate, B12 # Hypocalcemia: corrected to 8.8, pending PTH, vitamin D # Acute liver injury: GI consulted, suspect in setting of rhabdomyolysis, no focal lesions on liver u/s    # Alcohol use: per primary    Rana Snare 04/29/2023, 1:32 PM

## 2023-04-29 NOTE — Progress Notes (Addendum)
 PROGRESS NOTE    Andrew Page  ZOX:[id number]  DOB: [birth year]  DOA: 04/27/2023 PCP: Patient, No Pcp Per Outpatient Specialists:   Hospital course:  28 year old with history of anxiety and depression, EtOH and polysubstance use was admitted yesterday with nausea and vomiting after 5 days of alcohol binging.  Workup revealed rhabdomyolysis and AKI with CK of 26,000 and creatinine 3.2.  Patient was also noted to have elevated transaminases with AST greater than 7000 ALT around 5000 with INR of 1.5 and bili of 1.5.  Tylenol level was undetectable however he was started on NAC.   Subjective:  No significant events overnight, patient was lying comfortably, denied any pain, no bodyaches, no chest pain or palpitation, no shortness of breath. Not any withdrawal symptoms, no shaking or tremors.    Objective: Vitals:   04/29/23 0042 04/29/23 0411 04/29/23 0753 04/29/23 1458  BP: (!) 147/110 (!) 158/111 (!) 154/108 (!) 150/106  Pulse: 77 80 78 84  Resp: 18 19 18 18   Temp: 98.5 F (36.9 C) 98 F (36.7 C) 99.6 F (37.6 C) 98.3 F (36.8 C)  TempSrc: Oral Oral    SpO2: 100% 99% 97% 97%  Weight:      Height:       No intake or output data in the 24 hours ending 04/29/23 1624  Filed Weights   04/27/23 1716  Weight: 65.8 kg     Exam:  General: Sleepy but reasonably well-appearing man lying in bed NAD Eyes: sclera anicteric, conjuctiva mild injection bilaterally CVS: S1-S2, regular  Respiratory:  decreased air entry bilaterally secondary to decreased inspiratory effort, rales at bases  GI: NABS, soft, NT  LE: Warm and well-perfused Neuro: A/O x 3,  grossly nonfocal, sleepy but easily arousable by voice alone and coherent 1 awake.  Data Reviewed:  Basic Metabolic Panel: Recent Labs  Lab 04/27/23 1824 04/28/23 0447 04/29/23 0644  NA 144 140 138  K 5.1 3.8 3.4*  CL 107 104 102  CO2 17* 20* 18*  GLUCOSE 86 123* 86  BUN 24* 31* 40*  CREATININE 3.22* 4.50* 8.16*   CALCIUM 6.6* 6.4* 7.9*  MG 2.0 1.7  --   PHOS  --  3.3  --     CBC: Recent Labs  Lab 04/27/23 1718 04/28/23 0447 04/29/23 0644  WBC 19.5* 15.5* 12.0*  NEUTROABS 17.0*  --   --   HGB 14.8 12.0* 12.4*  HCT 46.7 36.2* 35.6*  MCV 97.9 94.5 89.4  PLT 220 180 143*     Scheduled Meds:  calcium carbonate  1 tablet Oral BID WC   folic acid  1 mg Oral Daily   heparin  5,000 Units Subcutaneous Q8H   LORazepam  0-4 mg Oral Q6H   Followed by   Melene Muller ON 04/30/2023] LORazepam  0-4 mg Oral Q12H   multivitamin with minerals  1 tablet Oral Daily   sodium chloride flush  3 mL Intravenous Q12H   thiamine  100 mg Oral Daily   Continuous Infusions:  sodium chloride 250 mL/hr at 04/29/23 1534     Assessment & Plan:   AKI most likely pigment induced ATN secondary rhabdomyolysis Oligoanuric Acute renal failure Cr 4.5---8.16 Very little urine output as per RN, continue to monitor urine output US renal reviewed, no obstruction Mild metabolic acidosis,  Continue IV fluid for hydration Monitor urine output renal functions daily Nephrology consulted, keep n.p.o. after midnight, patient may need hemodialysis tomorrow a.m.   Rhabdomyolysis CK 32k---2488 Continue IV fluid  for hydration Trend CK level  # EtOH induced hepatitis Abnormal LFTs Discussed with GI Dr. Fritzi Mandes, he feels this is not likely to be primary liver disease, transaminases are elevated likely secondary to his significant rhabdomyolysis vs Etoh abuse No further liver workup is warranted at present. Will discontinue NAC Trend LFTs  Hypocalcemia Calcium corrected to 7.2, most likely due to vitamin D deficiency Ionized calcium and PTH ordered No evidence of neuromuscular dysfunction EKG without QT prolongation Will start oral repletion per pharmacy recommendations  Alxoholism CIWA protocol MVI, folate  Avulsion fracture right ankle Supportive care, postop shoe per orthopedics  Vitamin D deficiency: started  vitamin D 50,000 units p.o. weekly, follow with PCP to repeat vitamin D level after 3 to 6 months.   Anemia, unknown cause Iron and folate within normal range B12 level is elevated Monitor H&H   DVT prophylaxis: sq heparin Code Status: Full Family Communication: spoke with mother      Studies: DG CHEST PORT 1 VIEW Result Date: 04/28/2023 CLINICAL DATA:  Dyspnea. EXAM: PORTABLE CHEST 1 VIEW COMPARISON:  11/12/2020 FINDINGS: The cardiomediastinal contours are normal. The lungs are clear. Pulmonary vasculature is normal. No consolidation, pleural effusion, or pneumothorax. No acute osseous abnormalities are seen. IMPRESSION: No active disease. Electronically Signed   By: Narda Rutherford M.D.   On: 04/28/2023 20:36   US LIVER DOPPLER Result Date: 04/28/2023 CLINICAL DATA:  Elevated liver function tests EXAM: DUPLEX ULTRASOUND OF LIVER TECHNIQUE: Color and duplex Doppler ultrasound was performed to evaluate the hepatic in-flow and out-flow vessels. COMPARISON:  None Available. FINDINGS: Liver: Normal parenchymal echogenicity. Normal hepatic contour without nodularity. No focal lesion, mass or intrahepatic biliary ductal dilatation. Main Portal Vein size: 1.4 cm Portal Vein Velocities Main Prox:  30 cm/sec Main Mid: 21 cm/sec Main Dist:  29 cm/sec Right: 9 cm/sec Left: 17 cm/sec Hepatic Vein Velocities Right:  13 cm/sec Middle:  38 cm/sec Left:  20 cm/sec IVC: Present and patent with normal respiratory phasicity. Hepatic Artery Velocity:  50 cm/sec Splenic Vein Velocity:  15 cm/sec Spleen: 9.6 cm x 8.7 cm x 3.4 cm with a total volume of 150 cm^3 (411 cm^3 is upper limit normal) Portal Vein Occlusion/Thrombus: No Splenic Vein Occlusion/Thrombus: No Ascites: None Varices: None IMPRESSION: 1. Normal hepatic Doppler examination. Electronically Signed   By: Helyn Numbers M.D.   On: 04/28/2023 01:45   US Abdomen Limited RUQ (LIVER/GB) Result Date: 04/27/2023 CLINICAL DATA:  Elevated liver function tests  EXAM: ULTRASOUND ABDOMEN LIMITED RIGHT UPPER QUADRANT COMPARISON:  None Available. FINDINGS: Gallbladder: No intraluminal stones or sludge is identified. The gallbladder is not overtly distended. There is mild gallbladder wall thickening with gallbladder wall measuring 4-6 mm in thickness. There is pericholecystic fluid present best appreciated within the gallbladder fossa. The sonographic Eulah Pont sign is reportedly negative. Common bile duct: Diameter: 4 mm in mid diameter Liver: No focal lesion identified. Within normal limits in parenchymal echogenicity. Portal vein is patent on color Doppler imaging with normal direction of blood flow towards the liver. Other: Visualized mid body of the pancreas is unremarkable. IMPRESSION: 1. Mild gallbladder wall thickening, possibly related to nondistention of the gallbladder, and pericholecystic fluid. These findings are nonspecific and while these may be seen in the setting of acalculous cholecystitis, additional considerations such as inflammatory conditions of the liver or biliary tree or cardiogenic failure could appear similarly. If there is clinical concern for acalculus cholecystitis, hepatobiliary scintigraphy may be helpful for further evaluation. Electronically Signed   By:  Helyn Numbers M.D.   On: 04/27/2023 23:18   US RENAL Result Date: 04/27/2023 CLINICAL DATA:  Acute renal insufficiency EXAM: RENAL / URINARY TRACT ULTRASOUND COMPLETE COMPARISON:  None Available. FINDINGS: Right Kidney: Renal measurements: 10.5 x 6.3 x 5.5 cm = volume: 187 mL. Renal cortical echogenicity is diffusely increased in keeping with changes of underlying medical renal disease. Cortical thickness is preserved. No intrarenal masses or calcifications. No hydronephrosis. No perinephric fluid collections are seen. Left Kidney: Renal measurements: 10.9 x 5.5 x 5.4 cm = volume: 171 mL. Renal cortical echogenicity is diffusely increased in keeping with changes of underlying medical renal  disease. Cortical thickness is preserved. No intrarenal masses or calcifications. No hydronephrosis. No perinephric fluid collections are seen. Bladder: Appears normal for degree of bladder distention. Other: None. IMPRESSION: 1. Diffusely increased renal cortical echogenicity in keeping with changes of underlying medical renal disease. Electronically Signed   By: Helyn Numbers M.D.   On: 04/27/2023 23:13   DG Foot Complete Right Result Date: 04/27/2023 CLINICAL DATA:  Right foot pain. EXAM: RIGHT FOOT COMPLETE - 3+ VIEW COMPARISON:  April 24, 2014 FINDINGS: There is a small area of cortical irregularity seen along the dorsal aspect of the talus. This is of indeterminate age and represents a new finding when compared to the prior study. There is no evidence of dislocation. There is no evidence of arthropathy or other focal bone abnormality. Soft tissues are unremarkable. IMPRESSION: Findings along the dorsal aspect of the right talus which may represent a small avulsion fracture of indeterminate age. Correlation with physical examination is recommended to determine the presence of point tenderness. Electronically Signed   By: Aram Candela M.D.   On: 04/27/2023 19:43    Principal Problem:   Elevated LFTs Active Problems:   Alcohol abuse with alcohol-induced mood disorder (HCC)   AKI (acute kidney injury) (HCC)   Rhabdomyolysis   Avulsion fracture of right talus   Metabolic acidosis   Total time spent: 55 min   Kanishk Stroebel Lucianne Muss, [facility]  If 7PM-7AM, please contact night-coverage [email or web address]   LOS: 2 days

## 2023-04-30 ENCOUNTER — Inpatient Hospital Stay (HOSPITAL_COMMUNITY): Payer: MEDICAID

## 2023-04-30 HISTORY — PX: IR FLUORO GUIDE CV LINE RIGHT: IMG2283

## 2023-04-30 HISTORY — PX: IR US GUIDE VASC ACCESS RIGHT: IMG2390

## 2023-04-30 LAB — CBC
HCT: 33.2 % — ABNORMAL LOW (ref 39.0–52.0)
Hemoglobin: 11.7 g/dL — ABNORMAL LOW (ref 13.0–17.0)
MCH: 31 pg (ref 26.0–34.0)
MCHC: 35.2 g/dL (ref 30.0–36.0)
MCV: 87.8 fL (ref 80.0–100.0)
Platelets: 158 10*3/uL (ref 150–400)
RBC: 3.78 MIL/uL — ABNORMAL LOW (ref 4.22–5.81)
RDW: 13.5 % (ref 11.5–15.5)
WBC: 9.6 10*3/uL (ref 4.0–10.5)
nRBC: 0 % (ref 0.0–0.2)

## 2023-04-30 LAB — BASIC METABOLIC PANEL
Anion gap: 16 — ABNORMAL HIGH (ref 5–15)
BUN: 48 mg/dL — ABNORMAL HIGH (ref 6–20)
CO2: 15 mmol/L — ABNORMAL LOW (ref 22–32)
Calcium: 7.7 mg/dL — ABNORMAL LOW (ref 8.9–10.3)
Chloride: 103 mmol/L (ref 98–111)
Creatinine, Ser: 10.77 mg/dL — ABNORMAL HIGH (ref 0.61–1.24)
GFR, Estimated: 6 mL/min — ABNORMAL LOW (ref >60)
Glucose, Bld: 88 mg/dL (ref 70–99)
Potassium: 3.6 mmol/L (ref 3.5–5.1)
Sodium: 134 mmol/L — ABNORMAL LOW (ref 135–145)

## 2023-04-30 LAB — HEPATIC FUNCTION PANEL
ALT: 1269 U/L — ABNORMAL HIGH (ref 0–44)
AST: 695 U/L — ABNORMAL HIGH (ref 15–41)
Albumin: 2.9 g/dL — ABNORMAL LOW (ref 3.5–5.0)
Alkaline Phosphatase: 63 U/L (ref 38–126)
Bilirubin, Direct: 0.7 mg/dL — ABNORMAL HIGH (ref 0.0–0.2)
Indirect Bilirubin: 1.5 mg/dL — ABNORMAL HIGH (ref 0.3–0.9)
Total Bilirubin: 2.2 mg/dL — ABNORMAL HIGH (ref 0.0–1.2)
Total Protein: 5.2 g/dL — ABNORMAL LOW (ref 6.5–8.1)

## 2023-04-30 LAB — PHOSPHORUS: Phosphorus: 2.3 mg/dL — ABNORMAL LOW (ref 2.5–4.6)

## 2023-04-30 LAB — MAGNESIUM: Magnesium: 1.7 mg/dL (ref 1.7–2.4)

## 2023-04-30 LAB — PROTIME-INR
INR: 1.2 (ref 0.8–1.2)
Prothrombin Time: 15.2 s (ref 11.4–15.2)

## 2023-04-30 LAB — HEPATITIS B SURFACE ANTIGEN: Hepatitis B Surface Ag: NONREACTIVE

## 2023-04-30 LAB — PARATHYROID HORMONE, INTACT (NO CA): PTH: 187 pg/mL — ABNORMAL HIGH (ref 15–65)

## 2023-04-30 LAB — CK: Total CK: 14724 U/L — ABNORMAL HIGH (ref 49–397)

## 2023-04-30 MED ORDER — HEPARIN SODIUM (PORCINE) 1000 UNIT/ML IJ SOLN
2800.0000 [IU] | Freq: Once | INTRAMUSCULAR | Status: AC
  Administered 2023-04-30: 2800 [IU]
  Filled 2023-04-30: qty 3

## 2023-04-30 MED ORDER — LIDOCAINE-EPINEPHRINE 1 %-1:100000 IJ SOLN
20.0000 mL | Freq: Once | INTRAMUSCULAR | Status: AC
  Administered 2023-04-30: 10 mL

## 2023-04-30 MED ORDER — ANTICOAGULANT SODIUM CITRATE 4% (200MG/5ML) IV SOLN: 5.0000 mL | Status: DC | PRN

## 2023-04-30 MED ORDER — LIDOCAINE HCL (PF) 1 % IJ SOLN: 5.0000 mL | INTRAMUSCULAR | Status: DC | PRN

## 2023-04-30 MED ORDER — ALTEPLASE 2 MG IJ SOLR: 2.0000 mg | Freq: Once | INTRAMUSCULAR | Status: DC | PRN

## 2023-04-30 MED ORDER — SODIUM BICARBONATE 8.4 % IV SOLN
50.0000 meq | Freq: Once | INTRAVENOUS | Status: AC
  Administered 2023-04-30: 50 meq via INTRAVENOUS
  Filled 2023-04-30: qty 50

## 2023-04-30 MED ORDER — PENTAFLUOROPROP-TETRAFLUOROETH EX AERO: 1.0000 | INHALATION_SPRAY | CUTANEOUS | Status: DC | PRN

## 2023-04-30 MED ORDER — HEPARIN SODIUM (PORCINE) 1000 UNIT/ML IJ SOLN
INTRAMUSCULAR | Status: AC
Start: 2023-04-30 — End: ?
  Filled 2023-04-30: qty 10

## 2023-04-30 MED ORDER — HEPARIN SODIUM (PORCINE) 1000 UNIT/ML DIALYSIS: 1000.0000 [IU] | INTRAMUSCULAR | Status: DC | PRN

## 2023-04-30 MED ORDER — CHLORHEXIDINE GLUCONATE CLOTH 2 % EX PADS
6.0000 | MEDICATED_PAD | Freq: Every day | CUTANEOUS | Status: DC
  Administered 2023-04-30 – 2023-05-01 (×2): 6 via TOPICAL

## 2023-04-30 MED ORDER — LIDOCAINE-EPINEPHRINE 1 %-1:100000 IJ SOLN
INTRAMUSCULAR | Status: AC
  Filled 2023-04-30: qty 1

## 2023-04-30 MED ORDER — LIDOCAINE-PRILOCAINE 2.5-2.5 % EX CREA: 1.0000 | TOPICAL_CREAM | CUTANEOUS | Status: DC | PRN

## 2023-04-30 MED ORDER — SODIUM BICARBONATE 8.4 % IV SOLN
100.0000 meq | Freq: Once | INTRAVENOUS | Status: AC
Start: 2023-04-30 — End: 2023-04-30
  Administered 2023-04-30: 50 meq via INTRAVENOUS
  Filled 2023-04-30: qty 50

## 2023-04-30 NOTE — Progress Notes (Signed)
   04/30/23 1407  Vitals  BP (!) 160/111  MAP (mmHg) 125  BP Location Left Arm  BP Method Automatic  Patient Position (if appropriate) Lying  Pulse Rate 95  Pulse Rate Source Monitor  Resp 18  MEWS COLOR  MEWS Score Color Green  Oxygen Therapy  SpO2 94 %  O2 Device Nasal Cannula  O2 Flow Rate (L/min) 2 L/min  Pain Assessment  Pain Scale 0-10  Pain Score 0  MEWS Score  MEWS Temp 0  MEWS Systolic 0  MEWS Pulse 0  MEWS RR 0  MEWS LOC 0  MEWS Score 0   Patient arrived back from IR with no signs of distress. Patient did not allow RN to obtain oral or axillary temp. Call bell within reach. Tele monitor reapplied. Bed alarm activated. Bed in lowest position.

## 2023-04-30 NOTE — Progress Notes (Signed)
 Received patient in bed to unit.  Alert and oriented.  Informed consent signed and in chart.   TX duration: 2 hours.  Patient was restless and moved around a lot.  His catheter site has some blood that I am speculating is from all the movement the patient had.  Patient tolerated well.  Transported back to the room  Alert, without acute distress.  Hand-off given to patient's nurse.   Access used: Right internal jugular HD cath Access issues: BFR set to as low as 300 due to high venous and arterial pressures.  Total UF removed: 0mL Medication(s) given: none   04/30/23 1827  Vitals  Temp 99.1 F (37.3 C)  Temp Source Oral  BP (!) 136/107  Pulse Rate 93  ECG Heart Rate 93  Resp (!) 21  Oxygen Therapy  SpO2 99 %  O2 Device Nasal Cannula  O2 Flow Rate (L/min) 3 L/min  During Treatment Monitoring  Duration of HD Treatment -hour(s) 2 hour(s)  HD Safety Checks Performed Yes  Intra-Hemodialysis Comments Tx completed  Dialysis Fluid Bolus Normal Saline  Bolus Amount (mL) 300 mL  Post Treatment  Dialyzer Clearance Heavily streaked  Liters Processed 47.7  Fluid Removed (mL) 0 mL  Tolerated HD Treatment No (Comment)  Post-Hemodialysis Comments see note  Hemodialysis Catheter Right Internal jugular  Placement Date/Time: 04/30/23 1340   Placed prior to admission: No  Serial / Lot #: [id number]  Expiration Date: 08/19/27  Time Out: Correct patient;Correct site;Correct procedure  Maximum sterile barrier precautions: Hand hygiene;Large sterile sheet;C...  Site Condition Bleeding  Blue Lumen Status Flushed;Heparin locked;Dead end cap in place  Red Lumen Status Flushed;Heparin locked;Blood return noted  Purple Lumen Status N/A  Catheter fill solution Heparin 1000 units/ml  Catheter fill volume (Arterial) 1.4 cc  Catheter fill volume (Venous) 1.4  Dressing Type Transparent  Dressing Status Antimicrobial disc/dressing in place;Clean, Dry, Intact  Interventions Other  (Comment) (deaccessed)  Drainage Description None  Dressing Change Due 05/07/23  Post treatment catheter status Capped and Clamped     Stacie Glaze LPN [facility]

## 2023-04-30 NOTE — Procedures (Signed)
 Interventional Radiology Procedure Note  Procedure: Image guided temporary hemodialysis catheter placement   Findings: Please refer to procedural dictation for full description. 20 cm right internal jugular Trialysis.  Complications: None immediate  Estimated Blood Loss: < 5 mL  Recommendations: Catheter ready for immediate use.    Marliss Coots, MD

## 2023-04-30 NOTE — Progress Notes (Signed)
 PROGRESS NOTE    Andrew Page  BJY:[id number]  DOB: 1995-09-19  DOA: 04/27/2023 PCP: Patient, No Pcp Per Outpatient Specialists:   Hospital course:  28 year old with history of anxiety and depression, EtOH and polysubstance use was admitted yesterday with nausea and vomiting after 5 days of alcohol binging.  Workup revealed rhabdomyolysis and AKI with CK of 26,000 and creatinine 3.2.  Patient was also noted to have elevated transaminases with AST greater than 7000 ALT around 5000 with INR of 1.5 and bili of 1.5.  Tylenol level was undetectable however he was started on NAC.   Subjective:  No significant events overnight, patient was lying comfortably, denied any pain, no bodyaches, no chest pain or palpitation, no shortness of breath. Not any withdrawal symptoms, no shaking or tremors.    Objective: Vitals:   04/30/23 1453 04/30/23 1539 04/30/23 1540 04/30/23 1542  BP: (!) 167/119   (!) 165/113  Pulse: (!) 101   88  Resp:    (!) 41  Temp: 98.8 F (37.1 C)  98.2 F (36.8 C) 97.6 F (36.4 C)  TempSrc: Oral  Oral   SpO2: 93%   95%  Weight:  79.9 kg  79.9 kg  Height:        Intake/Output Summary (Last 24 hours) at 04/30/2023 1548 Last data filed at 04/30/2023 0302 Gross per 24 hour  Intake 554.62 ml  Output --  Net 554.62 ml    Filed Weights   04/27/23 1716 04/30/23 1539 04/30/23 1542  Weight: 65.8 kg 79.9 kg 79.9 kg     Exam:  General: Sleepy but reasonably well-appearing man lying in bed NAD Eyes: sclera anicteric, conjuctiva mild injection bilaterally CVS: S1-S2, regular  Respiratory:  decreased air entry bilaterally secondary to decreased inspiratory effort, rales at bases  GI: NABS, soft, NT  LE: Warm and well-perfused Neuro: A/O x 3,  grossly nonfocal, sleepy but easily arousable by voice alone and coherent 1 awake.  Data Reviewed:  Basic Metabolic Panel: Recent Labs  Lab 04/27/23 1824 04/28/23 0447 04/29/23 0644 04/30/23 0644  NA 144 140  138 134*  K 5.1 3.8 3.4* 3.6  CL 107 104 102 103  CO2 17* 20* 18* 15*  GLUCOSE 86 123* 86 88  BUN 24* 31* 40* 48*  CREATININE 3.22* 4.50* 8.16* 10.77*  CALCIUM 6.6* 6.4* 7.9* 7.7*  MG 2.0 1.7  --  1.7  PHOS  --  3.3  --  2.3*    CBC: Recent Labs  Lab 04/27/23 1718 04/28/23 0447 04/29/23 0644 04/30/23 0644  WBC 19.5* 15.5* 12.0* 9.6  NEUTROABS 17.0*  --   --   --   HGB 14.8 12.0* 12.4* 11.7*  HCT 46.7 36.2* 35.6* 33.2*  MCV 97.9 94.5 89.4 87.8  PLT 220 180 143* 158     Scheduled Meds:  calcium carbonate  1 tablet Oral BID WC   Chlorhexidine Gluconate Cloth  6 each Topical Q0600   folic acid  1 mg Oral Daily   heparin  5,000 Units Subcutaneous Q8H   LORazepam  0-4 mg Oral Q12H   multivitamin with minerals  1 tablet Oral Daily   sodium chloride flush  3 mL Intravenous Q12H   thiamine  100 mg Oral Daily   Vitamin D (Ergocalciferol)  50,000 Units Oral Q7 days   Continuous Infusions:  sodium chloride 250 mL/hr at 04/30/23 1110   anticoagulant sodium citrate       Assessment & Plan:   AKI most likely pigment  induced ATN secondary rhabdomyolysis Oligoanuric Acute renal failure Cr 4.5---8.16--10.77 Very little urine output as per RN, continue to monitor urine output US renal reviewed, no obstruction Mild metabolic acidosis,  Continue IV fluid for hydration Monitor urine output renal functions daily Nephrology consulted, recommended hemodialysis 3/11 temporary dialysis catheter was inserted by IR Nephrology will start hemodialysis today on 04/30/2023 Follow Nephrology for further management   Metabolic acidosis secondary to acute renal failure 3/11 bicarb 15, IV bicarbonate 100 mEq one-time order placed until patient gets hemodialysis done.  Informed to nephrology Patient will get hemodialysis today   Rhabdomyolysis CK 32k---2488--14724 CK level gradually trending down 3/11 discontinued IV fluids as patient is volume overloaded, starting hemodialysis today by  nephrology. Trend CK level  # EtOH induced hepatitis Abnormal LFTs Discussed with GI Dr. Fritzi Mandes, he feels this is not likely to be primary liver disease, transaminases are elevated likely secondary to his significant rhabdomyolysis vs Etoh abuse No further liver workup is warranted at present. Will discontinue NAC Trend LFTs  Hypocalcemia Calcium corrected to 7.2, most likely due to vitamin D deficiency Ionized calcium and PTH ordered No evidence of neuromuscular dysfunction EKG without QT prolongation Will start oral repletion per pharmacy recommendations  Alxoholism CIWA protocol MVI, folate  Avulsion fracture right ankle Supportive care, postop shoe per orthopedics  Vitamin D deficiency: started vitamin D 50,000 units p.o. weekly, follow with PCP to repeat vitamin D level after 3 to 6 months.   Anemia, unknown cause Iron and folate within normal range B12 level is elevated Monitor H&H   DVT prophylaxis: sq heparin Code Status: Full Family Communication: spoke with mother      Studies: DG CHEST PORT 1 VIEW Result Date: 04/28/2023 CLINICAL DATA:  Dyspnea. EXAM: PORTABLE CHEST 1 VIEW COMPARISON:  11/12/2020 FINDINGS: The cardiomediastinal contours are normal. The lungs are clear. Pulmonary vasculature is normal. No consolidation, pleural effusion, or pneumothorax. No acute osseous abnormalities are seen. IMPRESSION: No active disease. Electronically Signed   By: Narda Rutherford M.D.   On: 04/28/2023 20:36    Principal Problem:   Elevated LFTs Active Problems:   Alcohol abuse with alcohol-induced mood disorder (HCC)   AKI (acute kidney injury) (HCC)   Rhabdomyolysis   Avulsion fracture of right talus   Metabolic acidosis   Total time spent: 55 min   Waleed Dettman Lucianne Muss, [facility]  If 7PM-7AM, please contact night-coverage [email or web address]   LOS: 3 days

## 2023-04-30 NOTE — Plan of Care (Signed)
  Problem: Activity: Goal: Risk for activity intolerance will decrease Outcome: Progressing   Problem: Nutrition: Goal: Adequate nutrition will be maintained Outcome: Progressing   Problem: Coping: Goal: Level of anxiety will decrease Outcome: Not Progressing   

## 2023-04-30 NOTE — Progress Notes (Signed)
 [facility] NEPHROLOGY PROGRESS NOTE  Assessment/ Plan: Pt is a 28 y.o. yo male  PMH of depression, anxiety and substance use including alcohol use.   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 No hx of kidney disease. Admit Scr 3.22. UA noted for large Hgb, proteinuria. Negative hepatitis panel, HIV. UDS showed THC. FeNa <1%, pre-renal, with admission urine sodium and creatinine of 38 and 178. Renal u/s showed increased renal echogenicity. with medical renal disease -Scr worsened despite IVF, now up to 10.77  -still oliguric despite aggressive IVF resuscitation  -strict I&Os  -trend renal function  -pending ceruloplasmin, immunoglobulins  -avoid nephrotoxic agents  -NPO since MN, IR consulted for dialysis cath placement -discussed plans for dialysis with patient and patient's mother    # Nontraumatic rhabdomyolysis  CK on admit 26,463>>32k>>24k>>14k -currently on NS at 250 ml/hr  -trend CK level    # Normocytic anemia: iron panel, B12 and folate unremarkable # Hypocalcemia: low vitamin D, pending PTH  # Acute liver injury: GI consulted, suspect in setting of rhabdomyolysis, no focal lesions on liver u/s    # Alcohol use: CIWA w ativan, per primary    Subjective:   Reports hyperventilation and anxiety. CIWA elevated, just received ativan.   Objective Vital signs in last 24 hours: Vitals:   04/30/23 0100 04/30/23 0400 04/30/23 0458 04/30/23 0730  BP: (!) 159/113 (!) 157/111 (!) 154/115 (!) 155/101  Pulse: 83 86 86 90  Resp: 20  15   Temp: 98.6 F (37 C)  98.1 F (36.7 C) 98.8 F (37.1 C)  TempSrc: Oral  Oral Oral  SpO2: 92%  93% 96%  Weight:      Height:       Weight change:   Intake/Output Summary (Last 24 hours) at 04/30/2023 0902 Last data filed at 04/30/2023 0302 Gross per 24 hour  Intake 554.62 ml  Output --  Net 554.62 ml       Labs: RENAL PANEL Recent Labs  Lab  04/27/23 1824 04/28/23 0447 04/29/23 0644  NA 144 140 138  K 5.1 3.8 3.4*  CL 107 104 102  CO2 17* 20* 18*  GLUCOSE 86 123* 86  BUN 24* 31* 40*  CREATININE 3.22* 4.50* 8.16*  CALCIUM 6.6* 6.4* 7.9*  MG 2.0 1.7  --   PHOS  --  3.3  --   ALBUMIN 3.7 3.0* 2.9*    Liver Function Tests: Recent Labs  Lab 04/27/23 1824 04/28/23 0447 04/29/23 0644  AST >7,000* >7,000* 2,042*  ALT 4,942* 3,529* 1,983*  ALKPHOS 87 69 74  BILITOT 1.5* 1.5* 2.4*  PROT 6.6 5.7* 5.3*  ALBUMIN 3.7 3.0* 2.9*   Recent Labs  Lab 04/27/23 1824  LIPASE 27   No results for input(s): "AMMONIA" in the last 168 hours. CBC: Recent Labs    04/27/23 1718 04/28/23 0447 04/29/23 0644 04/29/23 1200 04/30/23 0644  HGB 14.8 12.0* 12.4*  --  11.7*  MCV 97.9 94.5 89.4  --  87.8  VITAMINB12  --   --   --  4,105*  --   FOLATE  --   --   --  >40.0  --   TIBC  --   --   --  259  --   IRON  --   --   --  58  --     Cardiac Enzymes: Recent Labs  Lab 04/27/23 2154 04/28/23 0447 04/29/23 0644  CKTOTAL 16,109*  16,109* 60,454*   CBG: Recent Labs  Lab 04/29/23 2151  GLUCAP 85    Iron Studies:  Recent Labs    04/29/23 1200  IRON 58  TIBC 259   Studies/Results: DG CHEST PORT 1 VIEW Result Date: 04/28/2023 CLINICAL DATA:  Dyspnea. EXAM: PORTABLE CHEST 1 VIEW COMPARISON:  11/12/2020 FINDINGS: The cardiomediastinal contours are normal. The lungs are clear. Pulmonary vasculature is normal. No consolidation, pleural effusion, or pneumothorax. No acute osseous abnormalities are seen. IMPRESSION: No active disease. Electronically Signed   By: Narda Rutherford M.D.   On: 04/28/2023 20:36    Medications: Infusions:  sodium chloride 250 mL/hr at 04/30/23 0445    Scheduled Medications:  calcium carbonate  1 tablet Oral BID WC   folic acid  1 mg Oral Daily   heparin  5,000 Units Subcutaneous Q8H   LORazepam  0-4 mg Oral Q12H   multivitamin with minerals  1 tablet Oral Daily   sodium chloride flush  3 mL  Intravenous Q12H   thiamine  100 mg Oral Daily   Vitamin D (Ergocalciferol)  50,000 Units Oral Q7 days    have reviewed scheduled and prn medications.  Physical Exam: General:NAD, alert  Heart: RRR Lungs: normal effort on [place], clear bilaterally  Abdomen: bowel sounds present, soft, non-distended Extremities:No LE edema   Rana Snare 04/30/2023,9:02 AM  LOS: 3 days

## 2023-05-01 ENCOUNTER — Inpatient Hospital Stay (HOSPITAL_COMMUNITY): Payer: MEDICAID

## 2023-05-01 DIAGNOSIS — M7989 Other specified soft tissue disorders: Secondary | ICD-10-CM

## 2023-05-01 DIAGNOSIS — F1010 Alcohol abuse, uncomplicated: Secondary | ICD-10-CM

## 2023-05-01 DIAGNOSIS — L03114 Cellulitis of left upper limb: Secondary | ICD-10-CM

## 2023-05-01 DIAGNOSIS — E876 Hypokalemia: Secondary | ICD-10-CM

## 2023-05-01 DIAGNOSIS — E559 Vitamin D deficiency, unspecified: Secondary | ICD-10-CM

## 2023-05-01 LAB — HEPATITIS B SURFACE ANTIBODY, QUANTITATIVE: Hep B S AB Quant (Post): <3.5 m[IU]/mL — ABNORMAL LOW

## 2023-05-01 LAB — CBC
HCT: 33.7 % — ABNORMAL LOW (ref 39.0–52.0)
Hemoglobin: 11.9 g/dL — ABNORMAL LOW (ref 13.0–17.0)
MCH: 31.2 pg (ref 26.0–34.0)
MCHC: 35.3 g/dL (ref 30.0–36.0)
MCV: 88.2 fL (ref 80.0–100.0)
Platelets: 129 10*3/uL — ABNORMAL LOW (ref 150–400)
RBC: 3.82 MIL/uL — ABNORMAL LOW (ref 4.22–5.81)
RDW: 13.3 % (ref 11.5–15.5)
WBC: 10 10*3/uL (ref 4.0–10.5)
nRBC: 0 % (ref 0.0–0.2)

## 2023-05-01 LAB — HEPATIC FUNCTION PANEL
ALT: 937 U/L — ABNORMAL HIGH (ref 0–44)
AST: 297 U/L — ABNORMAL HIGH (ref 15–41)
Albumin: 2.7 g/dL — ABNORMAL LOW (ref 3.5–5.0)
Alkaline Phosphatase: 67 U/L (ref 38–126)
Bilirubin, Direct: 0.6 mg/dL — ABNORMAL HIGH (ref 0.0–0.2)
Indirect Bilirubin: 1.5 mg/dL — ABNORMAL HIGH (ref 0.3–0.9)
Total Bilirubin: 2.1 mg/dL — ABNORMAL HIGH (ref 0.0–1.2)
Total Protein: 5.3 g/dL — ABNORMAL LOW (ref 6.5–8.1)

## 2023-05-01 LAB — RENAL FUNCTION PANEL
Albumin: 2.8 g/dL — ABNORMAL LOW (ref 3.5–5.0)
Anion gap: 11 (ref 5–15)
BUN: 38 mg/dL — ABNORMAL HIGH (ref 6–20)
CO2: 22 mmol/L (ref 22–32)
Calcium: 7.7 mg/dL — ABNORMAL LOW (ref 8.9–10.3)
Chloride: 99 mmol/L (ref 98–111)
Creatinine, Ser: 9.87 mg/dL — ABNORMAL HIGH (ref 0.61–1.24)
GFR, Estimated: 7 mL/min — ABNORMAL LOW (ref >60)
Glucose, Bld: 89 mg/dL (ref 70–99)
Phosphorus: 2.7 mg/dL (ref 2.5–4.6)
Potassium: 3.4 mmol/L — ABNORMAL LOW (ref 3.5–5.1)
Sodium: 132 mmol/L — ABNORMAL LOW (ref 135–145)

## 2023-05-01 LAB — PROTIME-INR
INR: 1.1 (ref 0.8–1.2)
Prothrombin Time: 13.9 s (ref 11.4–15.2)

## 2023-05-01 LAB — MAGNESIUM: Magnesium: 1.7 mg/dL (ref 1.7–2.4)

## 2023-05-01 LAB — CK: Total CK: 7243 U/L — ABNORMAL HIGH (ref 49–397)

## 2023-05-01 MED ORDER — ONDANSETRON 4 MG PO TBDP
4.0000 mg | ORAL_TABLET | Freq: Four times a day (QID) | ORAL | Status: AC | PRN
  Administered 2023-05-02: 4 mg via ORAL
  Filled 2023-05-01 (×2): qty 1

## 2023-05-01 MED ORDER — ADULT MULTIVITAMIN W/MINERALS CH
1.0000 | ORAL_TABLET | Freq: Every day | ORAL | Status: DC
  Administered 2023-05-01 – 2023-05-03 (×3): 1 via ORAL
  Filled 2023-05-01 (×4): qty 1

## 2023-05-01 MED ORDER — CHLORDIAZEPOXIDE HCL 25 MG PO CAPS: 25.0000 mg | ORAL_CAPSULE | Freq: Every day | ORAL | Status: DC

## 2023-05-01 MED ORDER — CHLORDIAZEPOXIDE HCL 25 MG PO CAPS
25.0000 mg | ORAL_CAPSULE | Freq: Four times a day (QID) | ORAL | Status: AC
  Administered 2023-05-01 (×3): 25 mg via ORAL
  Filled 2023-05-01 (×3): qty 1

## 2023-05-01 MED ORDER — CHLORDIAZEPOXIDE HCL 25 MG PO CAPS
25.0000 mg | ORAL_CAPSULE | Freq: Four times a day (QID) | ORAL | Status: AC | PRN
  Administered 2023-05-01 – 2023-05-04 (×3): 25 mg via ORAL
  Filled 2023-05-01 (×3): qty 1

## 2023-05-01 MED ORDER — THIAMINE HCL 100 MG/ML IJ SOLN
100.0000 mg | Freq: Once | INTRAMUSCULAR | Status: AC
  Administered 2023-05-01: 100 mg via INTRAMUSCULAR
  Filled 2023-05-01: qty 2

## 2023-05-01 MED ORDER — PANTOPRAZOLE SODIUM 40 MG PO TBEC
40.0000 mg | DELAYED_RELEASE_TABLET | Freq: Two times a day (BID) | ORAL | Status: DC
  Administered 2023-05-02 – 2023-05-08 (×11): 40 mg via ORAL
  Filled 2023-05-01 (×12): qty 1

## 2023-05-01 MED ORDER — CHLORDIAZEPOXIDE HCL 25 MG PO CAPS
25.0000 mg | ORAL_CAPSULE | Freq: Three times a day (TID) | ORAL | Status: DC
  Administered 2023-05-02: 25 mg via ORAL
  Filled 2023-05-01: qty 1

## 2023-05-01 MED ORDER — CHLORHEXIDINE GLUCONATE CLOTH 2 % EX PADS
6.0000 | MEDICATED_PAD | Freq: Every day | CUTANEOUS | Status: DC
  Administered 2023-05-01 – 2023-05-02 (×2): 6 via TOPICAL

## 2023-05-01 MED ORDER — MAGNESIUM SULFATE 2 GM/50ML IV SOLN
2.0000 g | Freq: Once | INTRAVENOUS | Status: AC
  Administered 2023-05-01: 2 g via INTRAVENOUS
  Filled 2023-05-01: qty 50

## 2023-05-01 MED ORDER — CHLORDIAZEPOXIDE HCL 25 MG PO CAPS: 25.0000 mg | ORAL_CAPSULE | ORAL | Status: DC

## 2023-05-01 MED ORDER — SODIUM CHLORIDE 0.9 % IV SOLN
2.0000 g | INTRAVENOUS | Status: DC
  Administered 2023-05-01 – 2023-05-02 (×2): 2 g via INTRAVENOUS
  Filled 2023-05-01 (×2): qty 20

## 2023-05-01 MED ORDER — LOPERAMIDE HCL 2 MG PO CAPS: 2.0000 mg | ORAL_CAPSULE | ORAL | Status: AC | PRN

## 2023-05-01 MED ORDER — HYDROXYZINE HCL 25 MG PO TABS
25.0000 mg | ORAL_TABLET | Freq: Four times a day (QID) | ORAL | Status: AC | PRN
  Administered 2023-05-01 – 2023-05-04 (×4): 25 mg via ORAL
  Filled 2023-05-01 (×4): qty 1

## 2023-05-01 NOTE — Significant Event (Signed)
 Rapid Response Event Note   Reason for Call :  Increased RR  Initial Focused Assessment:  Patient lying in bed after returning from walking to bathroom--tachypneic requiring increased O2 from 3 to 6L [place]. Patient keeps eyes closed, states "I'm tired", is able to answer in complete sentences. Lungs wet bilaterally. Heart tones normal. Skin warm dry, minimal edema noted throughout though left elbow swollen/tight, erythema and considerably warm to touch.  162/116 HR 94 RR 40 O2 97% 6L  Interventions/Plan of Care:  MD at bedside CXR  Nephro contacted, plan for HD again today Left elbow swollen, XR O2 titrated down  Event Summary:  MD Notified: D. Janee Morn MD Call Time: 1101 Arrival Time: 1106 End Time: 1125  Truddie Crumble, RN

## 2023-05-01 NOTE — Progress Notes (Signed)
   05/01/23 1105  Assess: MEWS Score  BP (!) 166/121  Pulse Rate 95  Resp (!) 42  SpO2 96 %  O2 Device Nasal Cannula  O2 Flow Rate (L/min) 6 L/min  Assess: MEWS Score  MEWS Temp 0  MEWS Systolic 0  MEWS Pulse 0  MEWS RR 3  MEWS LOC 0  MEWS Score 3  MEWS Score Color Yellow  Assess: if the MEWS score is Yellow or Red  Were vital signs accurate and taken at a resting state? Yes  Does the patient have a confirmed or suspected source of infection? Yes  MEWS guidelines implemented  Yes, yellow  Treat  MEWS Interventions Considered administering scheduled or prn medications/treatments as ordered  Take Vital Signs  Increase Vital Sign Frequency  Yellow: Q2hr x1, continue Q4hrs until patient remains green for 12hrs  Escalate  MEWS: Escalate Yellow: Discuss with charge nurse and consider notifying provider and/or RRT  Notify: Charge Nurse/RN  Name of Charge Nurse/RN Notified Angelito  Provider Notification  Provider Name/Title Thompdonq  Date Provider Notified 05/01/23  Time Provider Notified 1105  Method of Notification Face-to-face  Notification Reason Change in status;Critical Result  Provider response At bedside  Date of Provider Response 05/01/23  Time of Provider Response 1105  Notify: Rapid Response  Name of Rapid Response RN Notified Dahlia Client  Date Rapid Response Notified 05/01/23  Time Rapid Response Notified 1100  Assess: SIRS CRITERIA  SIRS Temperature  0  SIRS Respirations  1  SIRS Pulse 1  SIRS WBC 0  SIRS Score Sum  2

## 2023-05-01 NOTE — Progress Notes (Signed)
 [facility] PROGRESS NOTE  Assessment/ Plan: Pt is a 28 y.o. yo male  PMH of depression, anxiety and substance use including alcohol use.   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 No hx of kidney disease. Admit Scr 3.22. UA noted for large Hgb, proteinuria. Negative hepatitis panel, HIV. UDS showed THC. FeNa <1%, pre-renal, with admission urine sodium and creatinine of 38 and 178. Renal u/s showed increased renal echogenicity. with medical renal disease - s/p temp dialysis cath and HD session yesterday, tolerated 2 hours - Scr improved after HD - consideration of additional session today  - strict I&Os, monitor UOP - trend renal function  - pending immunoglobulins  - avoid nephrotoxic agents    # Nontraumatic rhabdomyolysis  CK on admit 26,463>>32k>>24k>>14k>>7k - improving, trend CK level    # Normocytic anemia: iron panel, B12 and folate unremarkable # Hypocalcemia: low vitamin D, pending PTH  # Acute liver injury: GI consulted, suspect in setting of rhabdomyolysis, no focal lesions on liver u/s, LFTs trending down   # Alcohol use: CIWA w ativan, per primary    Subjective:   Alert, states fatigued. Very little urine output. States did well with HD yesterday.   Objective Vital signs in last 24 hours: Vitals:   04/30/23 2238 05/01/23 0200 05/01/23 0500 05/01/23 0546  BP: (!) 169/108   (!) 155/121  Pulse: 90 87 94 87  Resp:    19  Temp:    98.8 F (37.1 C)  TempSrc:    Oral  SpO2:    95%  Weight:      Height:       Weight change:   Intake/Output Summary (Last 24 hours) at 05/01/2023 0750 Last data filed at 05/01/2023 0417 Gross per 24 hour  Intake 500 ml  Output 0 ml  Net 500 ml       Labs: RENAL PANEL Recent Labs  Lab 04/27/23 1824 04/28/23 0447 04/29/23 0644 04/30/23 0644 05/01/23 0549 05/01/23 0550  NA 144 140 138 134*  --  132*  K 5.1 3.8 3.4* 3.6  --  3.4*   CL 107 104 102 103  --  99  CO2 17* 20* 18* 15*  --  22  GLUCOSE 86 123* 86 88  --  89  BUN 24* 31* 40* 48*  --  38*  CREATININE 3.22* 4.50* 8.16* 10.77*  --  9.87*  CALCIUM 6.6* 6.4* 7.9* 7.7*  --  7.7*  MG 2.0 1.7  --  1.7 1.7  --   PHOS  --  3.3  --  2.3*  --  2.7  ALBUMIN 3.7 3.0* 2.9* 2.9* 2.7* 2.8*    Liver Function Tests: Recent Labs  Lab 04/29/23 0644 04/30/23 0644 05/01/23 0549 05/01/23 0550  AST 2,042* 695* 297*  --   ALT 1,983* 1,269* 937*  --   ALKPHOS 74 63 67  --   BILITOT 2.4* 2.2* 2.1*  --   PROT 5.3* 5.2* 5.3*  --   ALBUMIN 2.9* 2.9* 2.7* 2.8*   Recent Labs  Lab 04/27/23 1824  LIPASE 27   No results for input(s): "AMMONIA" in the last 168 hours. CBC: Recent Labs    04/27/23 1718 04/28/23 0447 04/29/23 0644 04/29/23 1200 04/30/23 0644 05/01/23 0549  HGB 14.8 12.0* 12.4*  --  11.7* 11.9*  MCV 97.9 94.5 89.4  --  87.8 88.2  VITAMINB12  --   --   --  4,105*  --   --   FOLATE  --   --   --  >40.0  --   --   TIBC  --   --   --  259  --   --   IRON  --   --   --  58  --   --     Cardiac Enzymes: Recent Labs  Lab 04/27/23 2154 04/28/23 0447 04/29/23 0644 04/30/23 0644 05/01/23 0549  CKTOTAL 26,463* 32,693* 24,888* 14,724* 7,243*   CBG: Recent Labs  Lab 04/29/23 2151  GLUCAP 85    Iron Studies:  Recent Labs    04/29/23 1200  IRON 58  TIBC 259   Studies/Results: IR Fluoro Guide CV Line Right Result Date: 04/30/2023 INDICATION: 28 year old male with history of rhabdomyolysis requiring central venous access for hemodialysis. EXAM: NON-TUNNELED CENTRAL VENOUS HEMODIALYSIS CATHETER PLACEMENT WITH ULTRASOUND AND FLUOROSCOPIC GUIDANCE COMPARISON:  None Available. MEDICATIONS: None FLUOROSCOPY TIME:  One mGy COMPLICATIONS: None immediate. PROCEDURE: Informed written consent was obtained from the patient after a discussion of the risks, benefits, and alternatives to treatment. Questions regarding the procedure were encouraged and answered. The  right neck and chest were prepped with chlorhexidine in a sterile fashion, and a sterile drape was applied covering the operative field. Maximum barrier sterile technique with sterile gowns and gloves were used for the procedure. A timeout was performed prior to the initiation of the procedure. After the overlying soft tissues were anesthetized, a small venotomy incision was created and a micropuncture kit was utilized to access the internal jugular vein. Real-time ultrasound guidance was utilized for vascular access including the acquisition of a permanent ultrasound image documenting patency of the accessed vessel. The microwire was utilized to measure appropriate catheter length. A guidewire was advanced to the level of the IVC. Under fluoroscopic guidance, the venotomy was serially dilated, ultimately allowing placement of a 20 cm temporary Trialysis catheter with tip ultimately terminating within the superior aspect of the right atrium. Final catheter positioning was confirmed and documented with a spot radiographic image. The catheter aspirates and flushes normally. The catheter was flushed with appropriate volume heparin dwells. The catheter exit site was secured with a 0 silk retention suture. A dressing was placed. The patient tolerated the procedure well without immediate post procedural complication. IMPRESSION: Successful placement of a right internal jugular approach 20 cm temporary dialysis catheter with tip terminating with in the superior aspect of the right atrium. The catheter is ready for immediate use. PLAN: This catheter may be converted to a tunneled dialysis catheter at a later date as indicated. Marliss Coots, MD [facility] [place] [facility] Electronically Signed   By: Marliss Coots M.D.   On: 04/30/2023 16:28   IR US Guide Vasc Access Right Result Date: 04/30/2023 INDICATION: 28 year old male with history of rhabdomyolysis requiring central venous  access for hemodialysis. EXAM: NON-TUNNELED CENTRAL VENOUS HEMODIALYSIS CATHETER PLACEMENT WITH ULTRASOUND AND FLUOROSCOPIC GUIDANCE COMPARISON:  None Available. MEDICATIONS: None FLUOROSCOPY TIME:  One mGy COMPLICATIONS: None immediate. PROCEDURE: Informed written consent was obtained from the patient after a discussion of the risks, benefits, and alternatives to treatment. Questions regarding the procedure were encouraged and answered. The right neck and chest were prepped with chlorhexidine in a sterile fashion, and a sterile drape was applied covering the operative field. Maximum barrier sterile technique with sterile gowns and gloves were used for the procedure. A timeout was performed prior to the initiation of the procedure. After the overlying  soft tissues were anesthetized, a small venotomy incision was created and a micropuncture kit was utilized to access the internal jugular vein. Real-time ultrasound guidance was utilized for vascular access including the acquisition of a permanent ultrasound image documenting patency of the accessed vessel. The microwire was utilized to measure appropriate catheter length. A guidewire was advanced to the level of the IVC. Under fluoroscopic guidance, the venotomy was serially dilated, ultimately allowing placement of a 20 cm temporary Trialysis catheter with tip ultimately terminating within the superior aspect of the right atrium. Final catheter positioning was confirmed and documented with a spot radiographic image. The catheter aspirates and flushes normally. The catheter was flushed with appropriate volume heparin dwells. The catheter exit site was secured with a 0 silk retention suture. A dressing was placed. The patient tolerated the procedure well without immediate post procedural complication. IMPRESSION: Successful placement of a right internal jugular approach 20 cm temporary dialysis catheter with tip terminating with in the superior aspect of the right  atrium. The catheter is ready for immediate use. PLAN: This catheter may be converted to a tunneled dialysis catheter at a later date as indicated. Marliss Coots, MD [facility] Electronically Signed   By: Marliss Coots M.D.   On: 04/30/2023 16:28    Medications: Infusions:    Scheduled Medications:  calcium carbonate  1 tablet Oral BID WC   Chlorhexidine Gluconate Cloth  6 each Topical Q0600   folic acid  1 mg Oral Daily   heparin  5,000 Units Subcutaneous Q8H   LORazepam  0-4 mg Oral Q12H   multivitamin with minerals  1 tablet Oral Daily   sodium chloride flush  3 mL Intravenous Q12H   thiamine  100 mg Oral Daily   Vitamin D (Ergocalciferol)  50,000 Units Oral Q7 days    have reviewed scheduled and prn medications.  Physical Exam: General:NAD, alert  Heart: RRR Lungs: normal effort on [place], CTAB Abdomen: bowel sounds present, soft, non-distended, non-tender Extremities:No LE edema Skin: has RIJ HD cath    Rana Snare 05/01/2023,7:50 AM  LOS: 4 days

## 2023-05-01 NOTE — Progress Notes (Signed)
 PROGRESS NOTE    Andrew Page  HYQ:[id number] DOB: 1995-04-16 DOA: 04/27/2023 PCP: Patient, No Pcp Per    Chief Complaint  Patient presents with   Drug / Alcohol Assessment   Emesis    Brief Narrative:  28 year old with history of anxiety and depression, EtOH and polysubstance use was admitted yesterday with nausea and vomiting after 5 days of alcohol binging. Workup revealed rhabdomyolysis and AKI with CK of 26,000 and creatinine 3.2. Patient was also noted to have elevated transaminases with AST greater than 7000 ALT around 5000 with INR of 1.5 and bili of 1.5. Tylenol level was undetectable however he was started on NAC.  -Patient subsequently developed an uric worsening renal function, seen by nephrology and with no improvement was started on HD on 04/30/2023.  Patient also seen by GI who feel likely elevated transaminitis secondary to rhabdomyolysis.   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 -Creatinine went from 4.5>> 8.16>>> 10.77>>>> 9.87 (post HD on 04/30/2023) -Renal ultrasound done with diffusely increased renal cortical echogenicity in keeping with changes of underlying medical renal disease. -Patient seen in consultation by nephrology and due to worsening renal function HD initiated. -Temporary HD catheter placed by IR on 04/30/2023 and patient underwent first hemodialysis per nephrology recommendations  on 04/30/2023 which she tolerated. -Patient with worsening shortness of breath, tachypnea, concern for volume overload, patient to undergo hemodialysis again today 05/01/2023. -Per nephrology.  2.  Metabolic acidosis secondary to acute renal failure -Was on bicarb drip which was continued and subsequently discontinued once patient started on hemodialysis. -Metabolic acidosis improved with HD. -Per nephrology.  3.  Hypokalemia -Will defer to nephrology, patient to undergo HD today.  4.  Rhabdomyolysis -Felt secondary to alcohol use and laying on the floor. -CK went from 32K>>> 24,888>>14724>>>7243 -Initially placed on IV fluids with some improvement however IV fluids discontinued on 04/30/2023 as patient noted to be volume overloaded and started on hemodialysis. -CK trending down we will continue to follow. -On HD. -Nephrology following.  5.  Alcohol induced hepatitis/abnormal LFTs -Transaminitis felt likely secondary to alcohol induced in the setting of rhabdomyolysis. -Patient seen in consultation by GI, Dr. Adela Lank who feels likely not a primary liver issue and elevated likely secondary to significant rhabdomyolysis versus alcohol abuse. -No further liver workup planned at this time. -NAC discontinued. -LFTs trending down.  6.  Hypocalcemia -Corrected calcium 7.2 felt likely secondary to vitamin D deficiency. -Patient with no neuromuscular dysfunction. -EKG without QT prolongation. -Intact PTH noted at 187. -Continue Os-Cal.  7.  Alcohol abuse -Was on Ativan withdrawal protocol. -Place on the Librium detox protocol. -Continue thiamine, folic acid, multivitamin. -Alcohol cessation stressed to patient.  8.  Avulsion fracture right ankle -Supportive care, postop shoe per orthopedics.  9.  Vitamin D deficiency -Continue vitamin D 50,000 units weekly. -Will need outpatient follow-up with PCP for repeat vitamin D levels in 3 to 6 months.  10.  Left  upper extremity  swelling/left upper extremity cellulitis -Patient noted to have some swelling around the left elbow and left forearm with some erythema. -Plain films of the left elbow and left forearm with no acute abnormalities noted. -Place empirically on IV Rocephin and treat for presumptive cellulitis.  11.  Anemia -Questionable etiology. -Patient with no overt bleeding. -Per mother patient with a bout of hematemesis prior to admission and since then. -Hemoglobin currently stable at 11.9. -Placed on PPI in light of history of alcohol abuse.    DVT prophylaxis: Heparin Code Status: Full Family Communication: Updated patient and mother at bedside. Disposition: TBD  Status is: Inpatient Remains inpatient appropriate because: Severity of illness   Consultants:  Gastroenterology: Dr. Adela Lank 04/28/2023 Nephrology: Dr. Ronalee Belts 04/29/2023  Procedures:  Plan films of the left forearm 05/01/2023 Chest x-ray 04/27/2023, 04/28/2023, 05/01/2023 Plain films of the left elbow 05/01/2023 Right upper quadrant ultrasound 04/27/2023 Renal ultrasound 04/27/2023 Ultrasound of the liver 04/28/2023 Image guided temporary HD catheter placement per IR: Dr. Elby Showers 04/30/2023  Antimicrobials:  Anti-infectives (From admission, onward)    Start     Dose/Rate Route Frequency Ordered Stop   05/01/23 1300  cefTRIAXone (ROCEPHIN) 2 g in sodium chloride 0.9 % 100 mL IVPB        2 g 200 mL/hr over 30 Minutes Intravenous Every 24 hours 05/01/23 1127           Subjective: Patient in bed. Some anxiety earlier on improved with librium. SOB on exertion to Bathroom. Per RN patient with increased oxygen requirements and increased respiratory rate.  Rapid response at bedside.    Objective: Vitals:   05/01/23 1125 05/01/23 1200 05/01/23 1523 05/01/23 1843  BP:  (!) 151/112 (!) 156/121   Pulse:  97 99   Resp: (!) 35 (!) 26  (!) 28  Temp:   99.4 F (37.4 C)   TempSrc:   Oral   SpO2: 94% 92% 95%   Weight:      Height:         Intake/Output Summary (Last 24 hours) at 05/01/2023 1922 Last data filed at 05/01/2023 1554 Gross per 24 hour  Intake 769.13 ml  Output --  Net 769.13 ml   Filed Weights   04/30/23 1539 04/30/23 1542 04/30/23 1859  Weight: 79.9 kg 79.9 kg 79.9 kg    Examination:  General exam: Appears calm and comfortable  Respiratory system: Decrteased BS in bases. Diffuse crackles. tacypnea.  No use of accessory muscles of respiration.   Cardiovascular system:  Tachycardia. + JVD, murmurs, rubs, gallops or clicks. No pedal edema. Gastrointestinal system: Abdomen is nondistended, soft and nontender. No organomegaly or masses felt. Normal bowel sounds heard. Central nervous system: Alert and oriented. No focal neurological deficits. Extremities: LUE with swelling in forearm and around elbow. Skin: Left upper extremity with some erythema, some tenderness to palpation, some edema. Psychiatry: Judgement and insight appear normal. Mood & affect appropriate.     Data Reviewed: I have personally reviewed following labs and imaging studies  CBC: Recent Labs  Lab 04/27/23 1718 04/28/23 0447 04/29/23 0644 04/30/23 0644 05/01/23 0549  WBC 19.5* 15.5* 12.0* 9.6 10.0  NEUTROABS 17.0*  --   --   --   --   HGB 14.8 12.0* 12.4* 11.7* 11.9*  HCT 46.7 36.2* 35.6* 33.2* 33.7*  MCV 97.9 94.5 89.4 87.8 88.2  PLT 220 180 143* 158 129*    Basic Metabolic Panel: Recent Labs  Lab 04/27/23 1824 04/28/23 0447 04/29/23 0644 04/30/23  1610 05/01/23 0549 05/01/23 0550  NA 144 140 138 134*  --  132*  K 5.1 3.8 3.4* 3.6  --  3.4*  CL 107 104 102 103  --  99  CO2 17* 20* 18* 15*  --  22  GLUCOSE 86 123* 86 88  --  89  BUN 24* 31* 40* 48*  --  38*  CREATININE 3.22* 4.50* 8.16* 10.77*  --  9.87*  CALCIUM 6.6* 6.4* 7.9* 7.7*  --  7.7*  MG 2.0 1.7  --  1.7 1.7  --   PHOS  --  3.3  --  2.3*  --  2.7    GFR: Estimated Creatinine Clearance: 11.8 mL/min (A) (by C-G formula based on SCr of 9.87 mg/dL  (H)).  Liver Function Tests: Recent Labs  Lab 04/27/23 1824 04/28/23 0447 04/29/23 0644 04/30/23 0644 05/01/23 0549 05/01/23 0550  AST >7,000* >7,000* 2,042* 695* 297*  --   ALT 4,942* 3,529* 1,983* 1,269* 937*  --   ALKPHOS 87 69 74 63 67  --   BILITOT 1.5* 1.5* 2.4* 2.2* 2.1*  --   PROT 6.6 5.7* 5.3* 5.2* 5.3*  --   ALBUMIN 3.7 3.0* 2.9* 2.9* 2.7* 2.8*    CBG: Recent Labs  Lab 04/29/23 2151  GLUCAP 85     No results found for this or any previous visit (from the past 240 hours).       Radiology Studies: DG Forearm Left Result Date: 05/01/2023 CLINICAL DATA:  Left arm swelling. EXAM: LEFT FOREARM - 2 VIEW COMPARISON:  None Available. FINDINGS: There is no evidence of fracture or other focal bone lesions. Soft tissues are unremarkable. IMPRESSION: Negative. Electronically Signed   By: Lupita Raider M.D.   On: 05/01/2023 14:24   DG ELBOW COMPLETE LEFT (3+VIEW) Result Date: 05/01/2023 CLINICAL DATA:  Left elbow pain and swelling. EXAM: LEFT ELBOW - COMPLETE 3+ VIEW COMPARISON:  None Available. FINDINGS: There is no evidence of fracture, dislocation, or joint effusion. There is no evidence of arthropathy or other focal bone abnormality. Soft tissues are unremarkable. IMPRESSION: Negative. Electronically Signed   By: Lupita Raider M.D.   On: 05/01/2023 14:23   DG CHEST PORT 1 VIEW Result Date: 05/01/2023 CLINICAL DATA:  Shortness of breath. EXAM: PORTABLE CHEST 1 VIEW COMPARISON:  April 28, 2023. FINDINGS: The heart size and mediastinal contours are within normal limits. Right internal jugular catheter is noted with tip in expected position of right atrium. Mild central pulmonary vascular congestion is noted with possible mild bibasilar edema or atelectasis. The visualized skeletal structures are unremarkable. IMPRESSION: Mild central pulmonary vascular congestion with possible mild bibasilar edema or atelectasis. Electronically Signed   By: Lupita Raider M.D.   On:  05/01/2023 14:21   IR Fluoro Guide CV Line Right Result Date: 04/30/2023 INDICATION: 28 year old male with history of rhabdomyolysis requiring central venous access for hemodialysis. EXAM: NON-TUNNELED CENTRAL VENOUS HEMODIALYSIS CATHETER PLACEMENT WITH ULTRASOUND AND FLUOROSCOPIC GUIDANCE COMPARISON:  None Available. MEDICATIONS: None FLUOROSCOPY TIME:  One mGy COMPLICATIONS: None immediate. PROCEDURE: Informed written consent was obtained from the patient after a discussion of the risks, benefits, and alternatives to treatment. Questions regarding the procedure were encouraged and answered. The right neck and chest were prepped with chlorhexidine in a sterile fashion, and a sterile drape was applied covering the operative field. Maximum barrier sterile technique with sterile gowns and gloves were used for the procedure. A timeout was performed prior to the initiation of  the procedure. After the overlying soft tissues were anesthetized, a small venotomy incision was created and a micropuncture kit was utilized to access the internal jugular vein. Real-time ultrasound guidance was utilized for vascular access including the acquisition of a permanent ultrasound image documenting patency of the accessed vessel. The microwire was utilized to measure appropriate catheter length. A guidewire was advanced to the level of the IVC. Under fluoroscopic guidance, the venotomy was serially dilated, ultimately allowing placement of a 20 cm temporary Trialysis catheter with tip ultimately terminating within the superior aspect of the right atrium. Final catheter positioning was confirmed and documented with a spot radiographic image. The catheter aspirates and flushes normally. The catheter was flushed with appropriate volume heparin dwells. The catheter exit site was secured with a 0 silk retention suture. A dressing was placed. The patient tolerated the procedure well without immediate post procedural complication.  IMPRESSION: Successful placement of a right internal jugular approach 20 cm temporary dialysis catheter with tip terminating with in the superior aspect of the right atrium. The catheter is ready for immediate use. PLAN: This catheter may be converted to a tunneled dialysis catheter at a later date as indicated. Marliss Coots, MD [facility] Radiology Electronically Signed   By: Marliss Coots M.D.   On: 04/30/2023 16:28   IR US Guide Vasc Access Right Result Date: 04/30/2023 INDICATION: 28 year old male with history of rhabdomyolysis requiring central venous access for hemodialysis. EXAM: NON-TUNNELED CENTRAL VENOUS HEMODIALYSIS CATHETER PLACEMENT WITH ULTRASOUND AND FLUOROSCOPIC GUIDANCE COMPARISON:  None Available. MEDICATIONS: None FLUOROSCOPY TIME:  One mGy COMPLICATIONS: None immediate. PROCEDURE: Informed written consent was obtained from the patient after a discussion of the risks, benefits, and alternatives to treatment. Questions regarding the procedure were encouraged and answered. The right neck and chest were prepped with chlorhexidine in a sterile fashion, and a sterile drape was applied covering the operative field. Maximum barrier sterile technique with sterile gowns and gloves were used for the procedure. A timeout was performed prior to the initiation of the procedure. After the overlying soft tissues were anesthetized, a small venotomy incision was created and a micropuncture kit was utilized to access the internal jugular vein. Real-time ultrasound guidance was utilized for vascular access including the acquisition of a permanent ultrasound image documenting patency of the accessed vessel. The microwire was utilized to measure appropriate catheter length. A guidewire was advanced to the level of the IVC. Under fluoroscopic guidance, the venotomy was serially dilated, ultimately allowing placement of a 20 cm temporary Trialysis catheter with tip  ultimately terminating within the superior aspect of the right atrium. Final catheter positioning was confirmed and documented with a spot radiographic image. The catheter aspirates and flushes normally. The catheter was flushed with appropriate volume heparin dwells. The catheter exit site was secured with a 0 silk retention suture. A dressing was placed. The patient tolerated the procedure well without immediate post procedural complication. IMPRESSION: Successful placement of a right internal jugular approach 20 cm temporary dialysis catheter with tip terminating with in the superior aspect of the right atrium. The catheter is ready for immediate use. PLAN: This catheter may be converted to a tunneled dialysis catheter at a later date as indicated. Marliss Coots, MD [facility] Radiology Electronically Signed   By: Marliss Coots M.D.   On: 04/30/2023 16:28        Scheduled Meds:  calcium carbonate  1 tablet Oral BID WC   chlordiazePOXIDE  25 mg Oral QID   Followed by   Melene Muller ON 05/02/2023] chlordiazePOXIDE  25 mg Oral TID   Followed by   Melene Muller ON 05/03/2023] chlordiazePOXIDE  25 mg Oral BH-qamhs   Followed by   Melene Muller ON 05/04/2023] chlordiazePOXIDE  25 mg Oral Daily   Chlorhexidine Gluconate Cloth  6 each Topical Q0600   folic acid  1 mg Oral Daily   heparin  5,000 Units Subcutaneous Q8H   LORazepam  0-4 mg Oral Q12H   multivitamin with minerals  1 tablet Oral Daily   [START ON 05/02/2023] pantoprazole  40 mg Oral BID AC   sodium chloride flush  3 mL Intravenous Q12H   thiamine  100 mg Oral Daily   Vitamin D (Ergocalciferol)  50,000 Units Oral Q7 days   Continuous Infusions:  cefTRIAXone (ROCEPHIN)  IV Stopped (05/01/23 1302)     LOS: 4 days    Time spent: 40 minutes    Ramiro Harvest, MD [facility]   To contact the attending provider between 7A-7P or the covering provider during after hours 7P-7A, please log into the  web site [email or web address] and access using universal [place] password for that web site. If you do not have the password, please call the hospital operator.  05/01/2023, 7:22 PM

## 2023-05-02 ENCOUNTER — Inpatient Hospital Stay (HOSPITAL_COMMUNITY): Payer: MEDICAID

## 2023-05-02 DIAGNOSIS — K7200 Acute and subacute hepatic failure without coma: Secondary | ICD-10-CM

## 2023-05-02 DIAGNOSIS — D72829 Elevated white blood cell count, unspecified: Secondary | ICD-10-CM

## 2023-05-02 DIAGNOSIS — J9601 Acute respiratory failure with hypoxia: Secondary | ICD-10-CM

## 2023-05-02 DIAGNOSIS — J810 Acute pulmonary edema: Secondary | ICD-10-CM

## 2023-05-02 LAB — RENAL FUNCTION PANEL
Albumin: 2.8 g/dL — ABNORMAL LOW (ref 3.5–5.0)
Anion gap: 15 (ref 5–15)
BUN: 26 mg/dL — ABNORMAL HIGH (ref 6–20)
CO2: 24 mmol/L (ref 22–32)
Calcium: 7.9 mg/dL — ABNORMAL LOW (ref 8.9–10.3)
Chloride: 96 mmol/L — ABNORMAL LOW (ref 98–111)
Creatinine, Ser: 8.37 mg/dL — ABNORMAL HIGH (ref 0.61–1.24)
GFR, Estimated: 8 mL/min — ABNORMAL LOW (ref >60)
Glucose, Bld: 86 mg/dL (ref 70–99)
Phosphorus: 1.7 mg/dL — ABNORMAL LOW (ref 2.5–4.6)
Potassium: 3.7 mmol/L (ref 3.5–5.1)
Sodium: 135 mmol/L (ref 135–145)

## 2023-05-02 LAB — RESPIRATORY PANEL BY PCR

## 2023-05-02 LAB — HEPATIC FUNCTION PANEL
ALT: 666 U/L — ABNORMAL HIGH (ref 0–44)
AST: 142 U/L — ABNORMAL HIGH (ref 15–41)
Albumin: 2.7 g/dL — ABNORMAL LOW (ref 3.5–5.0)
Alkaline Phosphatase: 71 U/L (ref 38–126)
Bilirubin, Direct: 0.3 mg/dL — ABNORMAL HIGH (ref 0.0–0.2)
Indirect Bilirubin: 0.8 mg/dL (ref 0.3–0.9)
Total Bilirubin: 1.1 mg/dL (ref 0.0–1.2)
Total Protein: 5.7 g/dL — ABNORMAL LOW (ref 6.5–8.1)

## 2023-05-02 LAB — MAGNESIUM: Magnesium: 2 mg/dL (ref 1.7–2.4)

## 2023-05-02 LAB — CBC
HCT: 38.9 % — ABNORMAL LOW (ref 39.0–52.0)
Hemoglobin: 13.5 g/dL (ref 13.0–17.0)
MCH: 30.7 pg (ref 26.0–34.0)
MCHC: 34.7 g/dL (ref 30.0–36.0)
MCV: 88.4 fL (ref 80.0–100.0)
Platelets: 109 10*3/uL — ABNORMAL LOW (ref 150–400)
RBC: 4.4 MIL/uL (ref 4.22–5.81)
RDW: 13.2 % (ref 11.5–15.5)
WBC: 14.6 10*3/uL — ABNORMAL HIGH (ref 4.0–10.5)
nRBC: 0 % (ref 0.0–0.2)

## 2023-05-02 LAB — POCT I-STAT 7, (LYTES, BLD GAS, ICA,H+H)
Acid-Base Excess: 1 mmol/L (ref 0.0–2.0)
Bicarbonate: 25.6 mmol/L (ref 20.0–28.0)
Calcium, Ion: 1.09 mmol/L — ABNORMAL LOW (ref 1.15–1.40)
HCT: 37 % — ABNORMAL LOW (ref 39.0–52.0)
Hemoglobin: 12.6 g/dL — ABNORMAL LOW (ref 13.0–17.0)
O2 Saturation: 94 %
Patient temperature: 100.7
Potassium: 3.5 mmol/L (ref 3.5–5.1)
Sodium: 133 mmol/L — ABNORMAL LOW (ref 135–145)
TCO2: 27 mmol/L (ref 22–32)
pCO2 arterial: 40.5 mmHg (ref 32–48)
pH, Arterial: 7.414 (ref 7.35–7.45)
pO2, Arterial: 72 mmHg — ABNORMAL LOW (ref 83–108)

## 2023-05-02 LAB — RESP PANEL BY RT-PCR (RSV, FLU A&B, COVID)  RVPGX2
Influenza A by PCR: NEGATIVE
Influenza B by PCR: NEGATIVE
Resp Syncytial Virus by PCR: NEGATIVE
SARS Coronavirus 2 by RT PCR: NEGATIVE

## 2023-05-02 LAB — MRSA NEXT GEN BY PCR, NASAL: MRSA by PCR Next Gen: NOT DETECTED

## 2023-05-02 LAB — GLUCOSE, CAPILLARY: Glucose-Capillary: 106 mg/dL — ABNORMAL HIGH (ref 70–99)

## 2023-05-02 LAB — CK: Total CK: 3392 U/L — ABNORMAL HIGH (ref 49–397)

## 2023-05-02 MED ORDER — CHLORHEXIDINE GLUCONATE CLOTH 2 % EX PADS
6.0000 | MEDICATED_PAD | Freq: Every day | CUTANEOUS | Status: DC
  Administered 2023-05-02 – 2023-05-08 (×7): 6 via TOPICAL

## 2023-05-02 MED ORDER — ACETAMINOPHEN 325 MG PO TABS: 650.0000 mg | ORAL_TABLET | Freq: Four times a day (QID) | ORAL | Status: DC | PRN

## 2023-05-02 MED ORDER — PIPERACILLIN-TAZOBACTAM IN DEX 2-0.25 GM/50ML IV SOLN
2.2500 g | Freq: Three times a day (TID) | INTRAVENOUS | Status: DC
  Administered 2023-05-02 – 2023-05-03 (×3): 2.25 g via INTRAVENOUS
  Filled 2023-05-02 (×3): qty 50

## 2023-05-02 MED ORDER — HEPARIN SODIUM (PORCINE) 1000 UNIT/ML IJ SOLN
2800.0000 [IU] | INTRAMUSCULAR | Status: DC | PRN
  Administered 2023-05-02 (×2): 2800 [IU] via INTRAVENOUS

## 2023-05-02 MED ORDER — DOCUSATE SODIUM 283 MG RE ENEM: 1.0000 | ENEMA | RECTAL | Status: DC | PRN

## 2023-05-02 MED ORDER — ACETAMINOPHEN 650 MG RE SUPP: 650.0000 mg | Freq: Four times a day (QID) | RECTAL | Status: DC | PRN

## 2023-05-02 MED ORDER — CALCIUM CARBONATE ANTACID 1250 MG/5ML PO SUSP
500.0000 mg | Freq: Four times a day (QID) | ORAL | Status: DC | PRN
  Filled 2023-05-02: qty 5

## 2023-05-02 MED ORDER — SODIUM CHLORIDE 0.9 % IV SOLN
1.0000 g | INTRAVENOUS | Status: DC
  Filled 2023-05-02: qty 10

## 2023-05-02 MED ORDER — SODIUM CHLORIDE 0.9 % IV SOLN
500.0000 mg | INTRAVENOUS | Status: DC
  Administered 2023-05-02: 500 mg via INTRAVENOUS
  Filled 2023-05-02 (×2): qty 5

## 2023-05-02 MED ORDER — FUROSEMIDE 10 MG/ML IJ SOLN
80.0000 mg | Freq: Once | INTRAMUSCULAR | Status: AC
  Administered 2023-05-02: 80 mg via INTRAVENOUS
  Filled 2023-05-02: qty 8

## 2023-05-02 MED ORDER — NEPRO/CARBSTEADY PO LIQD: 237.0000 mL | Freq: Three times a day (TID) | ORAL | Status: DC | PRN

## 2023-05-02 MED ORDER — LORATADINE 10 MG PO TABS
10.0000 mg | ORAL_TABLET | Freq: Every day | ORAL | Status: DC
  Administered 2023-05-02 – 2023-05-08 (×7): 10 mg via ORAL
  Filled 2023-05-02 (×7): qty 1

## 2023-05-02 MED ORDER — CAMPHOR-MENTHOL 0.5-0.5 % EX LOTN: 1.0000 | TOPICAL_LOTION | Freq: Three times a day (TID) | CUTANEOUS | Status: DC | PRN

## 2023-05-02 MED ORDER — HEPARIN SODIUM (PORCINE) 1000 UNIT/ML IJ SOLN
INTRAMUSCULAR | Status: AC
  Filled 2023-05-02: qty 3

## 2023-05-02 MED ORDER — VANCOMYCIN VARIABLE DOSE PER UNSTABLE RENAL FUNCTION (PHARMACIST DOSING): Status: DC

## 2023-05-02 MED ORDER — IPRATROPIUM-ALBUTEROL 0.5-2.5 (3) MG/3ML IN SOLN
3.0000 mL | Freq: Three times a day (TID) | RESPIRATORY_TRACT | Status: DC
  Administered 2023-05-02 – 2023-05-04 (×7): 3 mL via RESPIRATORY_TRACT
  Filled 2023-05-02 (×7): qty 3

## 2023-05-02 MED ORDER — VANCOMYCIN HCL 1500 MG/300ML IV SOLN
1500.0000 mg | Freq: Once | INTRAVENOUS | Status: AC
  Administered 2023-05-02: 1500 mg via INTRAVENOUS
  Filled 2023-05-02 (×2): qty 300

## 2023-05-02 NOTE — Progress Notes (Signed)
 Pharmacy Antibiotic Note  Andrew Page is a 28 y.o. male admitted on 04/27/2023 with nausea and vomiting after an alcohol binge and now with fever / shortness of breath despite ceftriaxone therapy.  Pharmacy has been consulted for vancomycin and cefepime dosing.  Nephrology consulted - received iHD yesterday & planning on iHD today.   Plan: Vancomycin 1500 mg x1 after dialysis today  Cefepime 1000 mg nightly  Follow-up length of therapy, culture results, MRSA PCR   Height: 5\' 10"  (177.8 cm) Weight: 77.1 kg (169 lb 15.6 oz) IBW/kg (Calculated) : 73  Temp (24hrs), Avg:100.2 F (37.9 C), Min:99.1 F (37.3 C), Max:101.8 F (38.8 C)  Recent Labs  Lab 04/28/23 0447 04/29/23 0644 04/30/23 0644 05/01/23 0549 05/01/23 0550 05/02/23 0555 05/02/23 0556  WBC 15.5* 12.0* 9.6 10.0  --   --  14.6*  CREATININE 4.50* 8.16* 10.77*  --  9.87* 8.37*  --     Estimated Creatinine Clearance: 13.9 mL/min (A) (by C-G formula based on SCr of 8.37 mg/dL (H)).    No Known Allergies  Antimicrobials this admission: Ceftriaxone 3/12 >> 3/13 Azithromycin 3/13 >> c Vancomycin 3/13 >> c  Cefepime 3/13 >> c   Dose adjustments this admission: N/a  Microbiology results: 3/13 BCx: sent  3/13 Sputum: sent   3/13 MRSA PCR: sent   Thank you for allowing pharmacy to be a part of this patient's care.  Cedric Fishman 05/02/2023 1:28 PM

## 2023-05-02 NOTE — Progress Notes (Signed)
 Pt tolerated HD tx well. Pt taking off 4 Ls.   05/02/23 1906  Vitals  Temp 98.7 F (37.1 C)  Temp Source Oral  BP (!) 132/102  BP Location Left Arm  BP Method Automatic  Patient Position (if appropriate) Lying  Pulse Rate 98  ECG Heart Rate 98  Resp (!) 23  Oxygen Therapy  SpO2 100 %  O2 Device Nasal Cannula  O2 Flow Rate (L/min) 4 L/min  During Treatment Monitoring  Intra-Hemodialysis Comments Tx completed  Post Treatment  Dialyzer Clearance Lightly streaked  Hemodialysis Intake (mL) 0 mL  Liters Processed 84  Fluid Removed (mL) 4000 mL  Tolerated HD Treatment Yes  Post-Hemodialysis Comments Pt goal met.  Hemodialysis Catheter Right Internal jugular  Placement Date/Time: 04/30/23 1340   Placed prior to admission: No  Serial / Lot #: [id number]  Expiration Date: 08/19/27  Time Out: Correct patient;Correct site;Correct procedure  Maximum sterile barrier precautions: Hand hygiene;Large sterile sheet;C...  Site Condition No complications  Blue Lumen Status Heparin locked  Red Lumen Status Heparin locked  Catheter fill solution Heparin 1000 units/ml  Catheter fill volume (Arterial) 1.4 cc  Catheter fill volume (Venous) 1.4  Dressing Type Gauze/Drain sponge;Transparent  Dressing Status Clean, Dry, Intact  Interventions New dressing  Drainage Description None  Dressing Change Due 05/09/23  Post treatment catheter status Capped and Clamped

## 2023-05-02 NOTE — Progress Notes (Signed)
 Out Patient Arrangements:  Have been requested to follow for possible out pt HD placement. Please advise if services will be needed  Andree Elk [facility] [phone]

## 2023-05-02 NOTE — Progress Notes (Signed)
 Pharmacy Antibiotic Note  Andrew Page is a 28 y.o. male admitted on 04/27/2023 with nausea and vomiting after an alcohol binge and now with fever / shortness of breath despite ceftriaxone therapy.  Pharmacy has been consulted for vancomycin and zosyn dosing.  Nephrology consulted - received iHD yesterday & planning on iHD today. MD wanted to change from cefepime to zosyn.   Plan: Vancomycin 1500 mg x1 after dialysis today  Zosyn 2.25 g IV every 8 hours  Follow-up length of therapy, culture results, MRSA PCR   Height: 5\' 10"  (177.8 cm) Weight: 77 kg (169 lb 12.1 oz) IBW/kg (Calculated) : 73  Temp (24hrs), Avg:100 F (37.8 C), Min:98.4 F (36.9 C), Max:101.8 F (38.8 C)  Recent Labs  Lab 04/28/23 0447 04/29/23 0644 04/30/23 0644 05/01/23 0549 05/01/23 0550 05/02/23 0555 05/02/23 0556  WBC 15.5* 12.0* 9.6 10.0  --   --  14.6*  CREATININE 4.50* 8.16* 10.77*  --  9.87* 8.37*  --     Estimated Creatinine Clearance: 13.9 mL/min (A) (by C-G formula based on SCr of 8.37 mg/dL (H)).    No Known Allergies  Antimicrobials this admission: Ceftriaxone 3/12 >> 3/13 Azithromycin 3/13 >> c Vancomycin 3/13 >> c  Zosyn 3/13 >>   Dose adjustments this admission: N/a  Microbiology results: 3/13 BCx: sent  3/13 Sputum: sent   3/13 MRSA PCR: sent   Thank you for allowing pharmacy to participate in this patient's care,  Sherron Monday, PharmD, BCCCP Clinical Pharmacist  Phone: [phone] 05/02/2023 4:31 PM  Please check AMION for all [facility] Pharmacy phone numbers After 10:00 PM, call Main Pharmacy [phone]

## 2023-05-02 NOTE — Progress Notes (Signed)
 Pt was transitioned from BiPAP to  and is tolerating well on 4L. Fine crackles were auscultated w/ bilateral breath sounds.No increase WOB noted and Pt states breathing is better. BiPAP on standby if needed.

## 2023-05-02 NOTE — Significant Event (Signed)
 Rapid Response Event Note   Reason for Call :  Respiratory distress  Initial Focused Assessment:  Patient is alert and oriented.  He is able to answer questions appropriately, he is following command.  He is calm now that he is on BIpap. Per staff prior to starting bipap he was labored. He is still breathing rapidly.  BP 141/105  Hr 106  RR 45-52  O2 sat 98% on 50% O2   He denies pain,  he states that his breathing feels better now that he is on bipap.  He is able to slow his breathing down to 20-30 when he is focused on his breathing then when he relaxes again his RR is upper 40s    Interventions:  CCM consult:  Cindee Lame NP and Dr Merrily Pew at bedside to assess patient  Transfer patient to ICU. Family aware of transfer.  Plan of Care:  Hemodialysis this afternoon   Event Summary:   MD Notified: Dr Janee Morn prior to my arrival Call Time:  1304 Arrival Time: 1308 End Time: 1435  Marcellina Millin, RN

## 2023-05-02 NOTE — Progress Notes (Signed)
 Floor RN 4N just call HD RN and said pt is going to ICU and will call back after they find out what room the pt will be going to in ICU.

## 2023-05-02 NOTE — Progress Notes (Signed)
   05/02/23 0845  Vitals  Temp 100.3 F (37.9 C)  Temp Source Oral  BP (!) 150/109  BP Location Left Arm  BP Method Automatic  Patient Position (if appropriate) Lying  Pulse Rate (!) 105  Pulse Rate Source Monitor  Resp (!) 36  Level of Consciousness  Level of Consciousness Alert  MEWS COLOR  MEWS Score Color Red  Pain Assessment  Pain Scale 0-10  Pain Score 1  Pain Type Acute pain  Pain Location Abdomen  Pain Orientation Mid  Pain Descriptors / Indicators Other (Comment) (hunger pain)  Pain Frequency Constant  Pain Onset On-going  Patients Stated Pain Goal 0  PCA/Epidural/Spinal Assessment  Respiratory Pattern Labored;Tachypnea  MEWS Score  MEWS Temp 0  MEWS Systolic 0  MEWS Pulse 1  MEWS RR 3  MEWS LOC 0  MEWS Score 4   Vitals obtained. Dr. Janee Morn paged. Respiratory on the way to bedside to collect ABG and give nebulizer.

## 2023-05-02 NOTE — Progress Notes (Signed)
 1030- Patient transferred to 4NP for higher level of care. Report called to [facility] RN.

## 2023-05-02 NOTE — Progress Notes (Signed)
 [id number]- Paged Dr. Janee Morn because patient's breathing is more labored, increased O2 from 3L to 6L.

## 2023-05-02 NOTE — Progress Notes (Signed)
 Patient was transported to 2H15 on BIAP with RT & RN x 2. Report was given to 2H RT.

## 2023-05-02 NOTE — Progress Notes (Addendum)
 PROGRESS NOTE    Andrew Page  JYN:[id number] DOB: Nov 02, 1995 DOA: 04/27/2023 PCP: Patient, No Pcp Per    Chief Complaint  Patient presents with   Drug / Alcohol Assessment   Emesis    Brief Narrative:  28 year old with history of anxiety and depression, EtOH and polysubstance use was admitted yesterday with nausea and vomiting after 5 days of alcohol binging. Workup revealed rhabdomyolysis and AKI with CK of 26,000 and creatinine 3.2. Patient was also noted to have elevated transaminases with AST greater than 7000 ALT around 5000 with INR of 1.5 and bili of 1.5. Tylenol level was undetectable however he was started on NAC.  -Patient subsequently developed an uric worsening renal function, seen by nephrology and with no improvement was started on HD on 04/30/2023.  Patient also seen by GI who feel likely elevated transaminitis secondary to rhabdomyolysis.   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 -Creatinine went from 4.5>> 8.16>>> 10.77>>>> 9.87>>>> 8.37 (post HD on 05/01/2023) -Renal ultrasound done with diffusely increased renal cortical echogenicity in keeping with changes of underlying medical renal disease. -Patient seen in consultation by nephrology and due to worsening renal function HD initiated. -Temporary HD catheter placed by IR on 04/30/2023 and patient  underwent first hemodialysis per nephrology recommendations on 04/30/2023 which he tolerated. -Patient with worsening shortness of breath, tachypnea, concern for volume overload on 05/01/2023 and patient underwent hemodialysis with a liter of fluid removed however patient is still with worsening shortness of breath, tachypneic, increased O2 requirements and looks volume overloaded on examination. -Will likely need HD again today. -Per nephrology.  2.  Acute respiratory failure with hypoxia -Likely secondary to volume overload secondary to problem #1. -Patient with decreased breath sounds in the bases with diffuse crackles noted on exam as well as volume overload. -Patient underwent hemodialysis yesterday evening 05/01/2023 with a liter of fluid removed however no significant improvement with symptoms. -Check a respiratory viral panel, influenza Aand B PCR and COVID-19 PCR, chest x-ray. -ABG obtained this morning with a pH of 7.414, pCO2 of 40, pO2 of 72, bicarb of 25. -Transferred to progressive care unit placed on BiPAP pending nephrology evaluation. -DuoNebs 3 times daily. -Lasix 80 mg IV x 1. -Likely needs hemodialysis again today. -Spoke with nephrology and patient will be dialyzed this morning and nephrology had also recommended IV Lasix for now.  Addendum: 05/02/2023 1:13 PM -Patient noted to have continued respiratory distress after being transferred to the progressive care floor and subsequently placed on the BiPAP while awaiting hemodialysis.  Patient placed on BiPAP however continued tachypnea. -Respiratory viral panel done negative. -SARS coronavirus 2 PCR negative, influenza AMB by PCR negative.  RSV by PCR negative. -Chest x-ray done consistent with volume overload.  Patient noted to have a fever of 101.8 this morning and as such blood cultures ordered. -Will check a urine Legionella antigen, urine pneumococcus antigen  and broaden antibiotic coverage to IV vancomycin and IV cefepime  and azithromycin. -Check lower extremity Dopplers. -Spoke with nephrology and patient for emergent hemodialysis. -Consult with PCCM for further evaluation and management.   3.  Metabolic acidosis secondary to acute renal failure -Was on bicarb drip which was continued and subsequently discontinued once patient started on hemodialysis. -Metabolic acidosis resolved with HD. -Per nephrology.  4.  Hypokalemia/hypophosphatemia -Will defer to nephrology, patient underwent HD yesterday, will likely need hemodialysis today.   5.  Rhabdomyolysis -Felt secondary to alcohol use and laying on the floor. -CK went from 32K>>> 24,888>>14724>>>7243>>> 3392 -Initially placed on IV fluids with some improvement however IV fluids discontinued on 04/30/2023 as patient noted to be volume overloaded and started on hemodialysis. -CK trending down we will continue to follow. -On HD. -Nephrology following.  6.  Alcohol induced hepatitis/abnormal LFTs -Transaminitis felt likely secondary to alcohol induced in the setting of rhabdomyolysis. -Patient seen in consultation by GI, Dr. Adela Lank who feels likely not a primary liver issue and elevated likely secondary to significant rhabdomyolysis versus alcohol abuse. -No further liver workup planned at this time. -NAC discontinued. -LFTs trending down. -Follow-up.  7.  Hypocalcemia -Corrected calcium now at 9.9 felt likely secondary to vitamin D deficiency. -Patient with no neuromuscular dysfunction. -EKG without QT prolongation. -Intact PTH noted at 187. -Continue Os-Cal.  8.  Alcohol abuse -Was on Ativan withdrawal protocol. -Continue Librium detox protocol. -Continue thiamine, folic acid, multivitamin. -Alcohol cessation stressed to patient.  9.  Avulsion fracture right ankle -Supportive care, postop shoe per orthopedics.  10.  Vitamin D deficiency -Continue vitamin D 50,000 units weekly. -Will need outpatient follow-up with PCP for repeat vitamin  D levels in 3 to 6 months.  11.  Left upper extremity swelling/left upper extremity cellulitis -Patient noted to have some swelling around the left elbow and left forearm with some erythema. -Plain films of the left elbow and left forearm with no acute abnormalities noted. -Clinically improving.   -Continue IV Rocephin for treatment for presumptive cellulitis.    12.  Anemia -Questionable etiology. -Patient with no overt bleeding. -Per mother patient with a bout of hematemesis prior to admission and since then. -Hemoglobin currently stable at 12.6. -Patient with history of alcohol abuse and SI was placed on PPI which we will continue for now.    DVT prophylaxis: Heparin Code Status: Full Family Communication: Updated patient and mother at bedside. Disposition: Transfer to progressive care floor due to respiratory status.    Status is: Inpatient Remains inpatient appropriate because: Severity of illness   Consultants:  Gastroenterology: Dr. Adela Lank 04/28/2023 Nephrology: Dr. Ronalee Belts 04/29/2023  Procedures:  Plan films of the left forearm 05/01/2023 Chest x-ray 04/27/2023, 04/28/2023, 05/01/2023, 05/02/2023 Plain films of the left elbow 05/01/2023 Right upper quadrant ultrasound 04/27/2023 Renal ultrasound 04/27/2023 Ultrasound of the liver 04/28/2023 Image guided temporary HD catheter placement per IR: Dr. Elby Showers 04/30/2023  Antimicrobials:  Anti-infectives (From admission, onward)    Start     Dose/Rate Route Frequency Ordered Stop   05/01/23 1300  cefTRIAXone (ROCEPHIN) 2 g in sodium chloride 0.9 % 100 mL IVPB        2 g 200 mL/hr over 30 Minutes Intravenous Every 24 hours 05/01/23 1127           Subjective: Patient laying in bed.  Still with complaints of shortness of breath and feels his breathing is shallow.  Denies any chest pain.  No abdominal pain.  Mother at bedside.  Stated  after dialysis yesterday did not notice any significant improvement with shortness of breath.   Currently on 6 L nasal cannula with sats of 92%.   Objective: Vitals:   05/02/23 0127 05/02/23 0405 05/02/23 0845 05/02/23 0904  BP: (!) 155/109 (!) 147/116 (!) 150/109   Pulse: 97 99 (!) 105   Resp:  (!) 26 (!) 36   Temp: 100.2 F (37.9 C) (!) 100.7 F (38.2 C) 100.3 F (37.9 C)   TempSrc: Oral Oral Oral   SpO2: 98% 92%  92%  Weight:      Height:        Intake/Output Summary (Last 24 hours) at 05/02/2023 0934 Last data filed at 05/02/2023 0040 Gross per 24 hour  Intake 269.13 ml  Output 1000 ml  Net -730.87 ml   Filed Weights   04/30/23 1539 04/30/23 1542 04/30/23 1859  Weight: 79.9 kg 79.9 kg 79.9 kg    Examination:  General exam: Appears calm and comfortable  Respiratory system: Decreased breath sounds in the bases.  Diffuse crackles.  Tachypnea.  Speaking in full sentences. Cardiovascular system:  Tachycardia. + JVD, murmurs, rubs, gallops or clicks.  1+ bilateral lower extremity edema. Gastrointestinal system: Abdomen is nondistended, soft and nontender. No organomegaly or masses felt. Normal bowel sounds heard. Central nervous system: Alert and oriented. No focal neurological deficits. Extremities: LUE with swelling in forearm and around elbow improving.  Decreased erythema left upper extremity.  Less tight.  1+ bilateral lower extremity edema.. Skin: Left upper extremity with decreasing erythema, decreased tenderness to palpation, decreased edema.  Psychiatry: Judgement and insight appear normal. Mood & affect appropriate.     Data Reviewed: I have personally reviewed following labs and imaging studies  CBC: Recent Labs  Lab 04/27/23 1718 04/28/23 0447 04/29/23 0644 04/30/23 0644 05/01/23 0549 05/02/23 0556 05/02/23 0917  WBC 19.5* 15.5* 12.0* 9.6 10.0 14.6*  --   NEUTROABS 17.0*  --   --   --   --   --   --   HGB 14.8 12.0* 12.4* 11.7* 11.9* 13.5 12.6*  HCT 46.7 36.2* 35.6* 33.2* 33.7* 38.9* 37.0*  MCV 97.9 94.5 89.4 87.8 88.2 88.4  --   PLT 220 180  143* 158 129* 109*  --     Basic Metabolic Panel: Recent Labs  Lab 04/27/23 1824 04/28/23 0447 04/29/23 0644 04/30/23 0644 05/01/23 0549 05/01/23 0550 05/02/23 0555 05/02/23 0556 05/02/23 0917  NA 144 140 138 134*  --  132* 135  --  133*  K 5.1 3.8 3.4* 3.6  --  3.4* 3.7  --  3.5  CL 107 104 102 103  --  99 96*  --   --   CO2 17* 20* 18* 15*  --  22 24  --   --   GLUCOSE 86 123* 86 88  --  89 86  --   --   BUN 24* 31* 40* 48*  --  38* 26*  --   --   CREATININE 3.22* 4.50* 8.16* 10.77*  --  9.87* 8.37*  --   --   CALCIUM 6.6* 6.4* 7.9* 7.7*  --  7.7* 7.9*  --   --   MG 2.0 1.7  --  1.7 1.7  --   --  2.0  --   PHOS  --  3.3  --  2.3*  --  2.7 1.7*  --   --     GFR: Estimated Creatinine Clearance: 13.9 mL/min (A) (by C-G formula  based on SCr of 8.37 mg/dL (H)).  Liver Function Tests: Recent Labs  Lab 04/28/23 0447 04/29/23 0644 04/30/23 0644 05/01/23 0549 05/01/23 0550 05/02/23 0555 05/02/23 0556  AST >7,000* 2,042* 695* 297*  --   --  142*  ALT 3,529* 1,983* 1,269* 937*  --   --  666*  ALKPHOS 69 74 63 67  --   --  71  BILITOT 1.5* 2.4* 2.2* 2.1*  --   --  1.1  PROT 5.7* 5.3* 5.2* 5.3*  --   --  5.7*  ALBUMIN 3.0* 2.9* 2.9* 2.7* 2.8* 2.8* 2.7*    CBG: Recent Labs  Lab 04/29/23 2151  GLUCAP 85     No results found for this or any previous visit (from the past 240 hours).       Radiology Studies: DG Forearm Left Result Date: 05/01/2023 CLINICAL DATA:  Left arm swelling. EXAM: LEFT FOREARM - 2 VIEW COMPARISON:  None Available. FINDINGS: There is no evidence of fracture or other focal bone lesions. Soft tissues are unremarkable. IMPRESSION: Negative. Electronically Signed   By: Lupita Raider M.D.   On: 05/01/2023 14:24   DG ELBOW COMPLETE LEFT (3+VIEW) Result Date: 05/01/2023 CLINICAL DATA:  Left elbow pain and swelling. EXAM: LEFT ELBOW - COMPLETE 3+ VIEW COMPARISON:  None Available. FINDINGS: There is no evidence of fracture, dislocation, or joint  effusion. There is no evidence of arthropathy or other focal bone abnormality. Soft tissues are unremarkable. IMPRESSION: Negative. Electronically Signed   By: Lupita Raider M.D.   On: 05/01/2023 14:23   DG CHEST PORT 1 VIEW Result Date: 05/01/2023 CLINICAL DATA:  Shortness of breath. EXAM: PORTABLE CHEST 1 VIEW COMPARISON:  April 28, 2023. FINDINGS: The heart size and mediastinal contours are within normal limits. Right internal jugular catheter is noted with tip in expected position of right atrium. Mild central pulmonary vascular congestion is noted with possible mild bibasilar edema or atelectasis. The visualized skeletal structures are unremarkable. IMPRESSION: Mild central pulmonary vascular congestion with possible mild bibasilar edema or atelectasis. Electronically Signed   By: Lupita Raider M.D.   On: 05/01/2023 14:21   IR Fluoro Guide CV Line Right Result Date: 04/30/2023 INDICATION: 28 year old male with history of rhabdomyolysis requiring central venous access for hemodialysis. EXAM: NON-TUNNELED CENTRAL VENOUS HEMODIALYSIS CATHETER PLACEMENT WITH ULTRASOUND AND FLUOROSCOPIC GUIDANCE COMPARISON:  None Available. MEDICATIONS: None FLUOROSCOPY TIME:  One mGy COMPLICATIONS: None immediate. PROCEDURE: Informed written consent was obtained from the patient after a discussion of the risks, benefits, and alternatives to treatment. Questions regarding the procedure were encouraged and answered. The right neck and chest were prepped with chlorhexidine in a sterile fashion, and a sterile drape was applied covering the operative field. Maximum barrier sterile technique with sterile gowns and gloves were used for the procedure. A timeout was performed prior to the initiation of the procedure. After the overlying soft tissues were anesthetized, a small venotomy incision was created and a micropuncture kit was utilized to access the internal jugular vein. Real-time ultrasound guidance was utilized for  vascular access including the acquisition of a permanent ultrasound image documenting patency of the accessed vessel. The microwire was utilized to measure appropriate catheter length. A guidewire was advanced to the level of the IVC. Under fluoroscopic guidance, the venotomy was serially dilated, ultimately allowing placement of a 20 cm temporary Trialysis catheter with tip ultimately terminating within the superior aspect of the right atrium. Final catheter positioning was confirmed and documented with  a spot radiographic image. The catheter aspirates and flushes normally. The catheter was flushed with appropriate volume heparin dwells. The catheter exit site was secured with a 0 silk retention suture. A dressing was placed. The patient tolerated the procedure well without immediate post procedural complication. IMPRESSION: Successful placement of a right internal jugular approach 20 cm temporary dialysis catheter with tip terminating with in the superior aspect of the right atrium. The catheter is ready for immediate use. PLAN: This catheter may be converted to a tunneled dialysis catheter at a later date as indicated. Marliss Coots, MD [facility] Radiology Electronically Signed   By: Marliss Coots M.D.   On: 04/30/2023 16:28   IR US Guide Vasc Access Right Result Date: 04/30/2023 INDICATION: 28 year old male with history of rhabdomyolysis requiring central venous access for hemodialysis. EXAM: NON-TUNNELED CENTRAL VENOUS HEMODIALYSIS CATHETER PLACEMENT WITH ULTRASOUND AND FLUOROSCOPIC GUIDANCE COMPARISON:  None Available. MEDICATIONS: None FLUOROSCOPY TIME:  One mGy COMPLICATIONS: None immediate. PROCEDURE: Informed written consent was obtained from the patient after a discussion of the risks, benefits, and alternatives to treatment. Questions regarding the procedure were encouraged and answered. The right neck and chest were prepped with chlorhexidine in a  sterile fashion, and a sterile drape was applied covering the operative field. Maximum barrier sterile technique with sterile gowns and gloves were used for the procedure. A timeout was performed prior to the initiation of the procedure. After the overlying soft tissues were anesthetized, a small venotomy incision was created and a micropuncture kit was utilized to access the internal jugular vein. Real-time ultrasound guidance was utilized for vascular access including the acquisition of a permanent ultrasound image documenting patency of the accessed vessel. The microwire was utilized to measure appropriate catheter length. A guidewire was advanced to the level of the IVC. Under fluoroscopic guidance, the venotomy was serially dilated, ultimately allowing placement of a 20 cm temporary Trialysis catheter with tip ultimately terminating within the superior aspect of the right atrium. Final catheter positioning was confirmed and documented with a spot radiographic image. The catheter aspirates and flushes normally. The catheter was flushed with appropriate volume heparin dwells. The catheter exit site was secured with a 0 silk retention suture. A dressing was placed. The patient tolerated the procedure well without immediate post procedural complication. IMPRESSION: Successful placement of a right internal jugular approach 20 cm temporary dialysis catheter with tip terminating with in the superior aspect of the right atrium. The catheter is ready for immediate use. PLAN: This catheter may be converted to a tunneled dialysis catheter at a later date as indicated. Marliss Coots, MD [facility] Radiology Electronically Signed   By: Marliss Coots M.D.   On: 04/30/2023 16:28        Scheduled Meds:  calcium carbonate  1 tablet Oral BID WC   chlordiazePOXIDE  25 mg Oral QID   Followed by   chlordiazePOXIDE  25 mg Oral TID   Followed by   Melene Muller ON 05/03/2023]  chlordiazePOXIDE  25 mg Oral BH-qamhs   Followed by   Melene Muller ON 05/04/2023] chlordiazePOXIDE  25 mg Oral Daily   Chlorhexidine Gluconate Cloth  6 each Topical Q0600   folic acid  1 mg Oral Daily   heparin  5,000 Units Subcutaneous Q8H   ipratropium-albuterol  3 mL Nebulization TID   loratadine  10 mg Oral Daily   multivitamin with minerals  1 tablet Oral Daily   pantoprazole  40 mg Oral  BID AC   sodium chloride flush  3 mL Intravenous Q12H   thiamine  100 mg Oral Daily   Vitamin D (Ergocalciferol)  50,000 Units Oral Q7 days   Continuous Infusions:  cefTRIAXone (ROCEPHIN)  IV Stopped (05/01/23 1302)     LOS: 5 days    Time spent: 40 minutes    Ramiro Harvest, MD [facility]   To contact the attending provider between 7A-7P or the covering provider during after hours 7P-7A, please log into the web site [email or web address] and access using universal [place] password for that web site. If you do not have the password, please call the hospital operator.  05/02/2023, 9:34 AM

## 2023-05-02 NOTE — Progress Notes (Signed)
 Completed 3 hours of diaysis. Tolerated net fluid removal of 1000 ml. VS stable. No acute events noted. RIJ Uldall catheter hep-locked and de-accessed using aseptic technique. Caps replaced. Catheter dressing changed provided with no signs of infection noted. Patient is alert, in stable condition upon leaving dialysis unit. Handoff given to [facility]

## 2023-05-02 NOTE — Progress Notes (Signed)
 Talk with floor RN and explain that as soon as HD RN comes from doing a ICU that she will be coming to do the pt. MD Ronalee Belts is aware .

## 2023-05-02 NOTE — Progress Notes (Addendum)
 [facility] NEPHROLOGY PROGRESS NOTE  Assessment/ Plan: Pt is a 28 y.o. yo male  with alcohol abuse presented with rhabdomyolysis, AKI and features of liver injury.   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  Started IV fluid without major urine output and worsening renal labs.   Started HD on 3/11 after placement of temporary HD catheter by IR. Received dialysis last night with 1 L UF.  Tolerated well.  This morning patient was febrile, respiratory distress requiring higher amount of oxygen and moved to higher level of care.  Chest x-ray obtained, result pending. Will give him lasix 80 mg IV. Plan to do another dialysis today mainly to remove fluid.  Hopefully this will help him breathing better and also lower CK level. Strict ins and out and daily lab monitoring.   #Metabolic acidosis: Corrected with dialysis.   #Acute transaminitis, seen by GI thought to be due to rhabdomyolysis.   #Alcohol abuse: Continue vitamins and withdrawal protocol.  Supportive care.   #Hypokalemia: Adjust potassium bath during HD.  #Acute febrile, acute hypoxic respiratory failure: Pending chest x-ray.  Concerning for infection however respiratory viral panel negative.  UF with dialysis.  Discussed with the primary team and dialysis nurses.   Subjective: Seen and examined.  Patient is moved to for NP.  Currently on 5 to 6 L of oxygen.  Febrile this morning.  Somnolent.  Denies chest pain however review of system is limited. Objective Vital signs in last 24 hours: Vitals:   05/02/23 0127 05/02/23 0405 05/02/23 0845 05/02/23 0904  BP: (!) 155/109 (!) 147/116 (!) 150/109   Pulse: 97 99 (!) 105   Resp:  (!) 26 (!) 36   Temp: 100.2 F (37.9 C) (!) 100.7 F (38.2 C) 100.3 F (37.9 C)   TempSrc: Oral Oral Oral   SpO2: 98% 92%  92%  Weight:      Height:       Weight change:    Intake/Output Summary (Last 24 hours) at 05/02/2023 1134 Last data filed at 05/02/2023 0040 Gross per 24 hour  Intake 269.13 ml  Output 1000 ml  Net -730.87 ml       Labs: RENAL PANEL Recent Labs  Lab 04/27/23 1824 04/28/23 0447 04/29/23 0644 04/30/23 0644 05/01/23 0549 05/01/23 0550 05/02/23 0555 05/02/23 0556 05/02/23 0917  NA 144 140 138 134*  --  132* 135  --  133*  K 5.1 3.8 3.4* 3.6  --  3.4* 3.7  --  3.5  CL 107 104 102 103  --  99 96*  --   --   CO2 17* 20* 18* 15*  --  22 24  --   --   GLUCOSE 86 123* 86 88  --  89 86  --   --   BUN 24* 31* 40* 48*  --  38* 26*  --   --   CREATININE 3.22* 4.50* 8.16* 10.77*  --  9.87* 8.37*  --   --   CALCIUM 6.6* 6.4* 7.9* 7.7*  --  7.7* 7.9*  --   --   MG 2.0 1.7  --  1.7 1.7  --   --  2.0  --   PHOS  --  3.3  --  2.3*  --  2.7 1.7*  --   --   ALBUMIN 3.7 3.0* 2.9* 2.9* 2.7* 2.8* 2.8* 2.7*  --     Liver  Function Tests: Recent Labs  Lab 04/30/23 0644 05/01/23 0549 05/01/23 0550 05/02/23 0555 05/02/23 0556  AST 695* 297*  --   --  142*  ALT 1,269* 937*  --   --  666*  ALKPHOS 63 67  --   --  71  BILITOT 2.2* 2.1*  --   --  1.1  PROT 5.2* 5.3*  --   --  5.7*  ALBUMIN 2.9* 2.7* 2.8* 2.8* 2.7*   Recent Labs  Lab 04/27/23 1824  LIPASE 27   No results for input(s): "AMMONIA" in the last 168 hours. CBC: Recent Labs    04/29/23 0644 04/29/23 1200 04/30/23 0644 05/01/23 0549 05/02/23 0556 05/02/23 0917  HGB 12.4*  --  11.7* 11.9* 13.5 12.6*  MCV 89.4  --  87.8 88.2 88.4  --   VITAMINB12  --  4,105*  --   --   --   --   FOLATE  --  >40.0  --   --   --   --   TIBC  --  259  --   --   --   --   IRON  --  58  --   --   --   --     Cardiac Enzymes: Recent Labs  Lab 04/28/23 0447 04/29/23 0644 04/30/23 0644 05/01/23 0549 05/02/23 0556  CKTOTAL 32,693* 24,888* 14,724* 7,243* 3,392*   CBG: Recent Labs  Lab 04/29/23 2151  GLUCAP 85    Iron Studies:  Recent Labs    04/29/23 1200  IRON 58  TIBC  259   Studies/Results: DG Forearm Left Result Date: 05/01/2023 CLINICAL DATA:  Left arm swelling. EXAM: LEFT FOREARM - 2 VIEW COMPARISON:  None Available. FINDINGS: There is no evidence of fracture or other focal bone lesions. Soft tissues are unremarkable. IMPRESSION: Negative. Electronically Signed   By: Lupita Raider M.D.   On: 05/01/2023 14:24   DG ELBOW COMPLETE LEFT (3+VIEW) Result Date: 05/01/2023 CLINICAL DATA:  Left elbow pain and swelling. EXAM: LEFT ELBOW - COMPLETE 3+ VIEW COMPARISON:  None Available. FINDINGS: There is no evidence of fracture, dislocation, or joint effusion. There is no evidence of arthropathy or other focal bone abnormality. Soft tissues are unremarkable. IMPRESSION: Negative. Electronically Signed   By: Lupita Raider M.D.   On: 05/01/2023 14:23   DG CHEST PORT 1 VIEW Result Date: 05/01/2023 CLINICAL DATA:  Shortness of breath. EXAM: PORTABLE CHEST 1 VIEW COMPARISON:  April 28, 2023. FINDINGS: The heart size and mediastinal contours are within normal limits. Right internal jugular catheter is noted with tip in expected position of right atrium. Mild central pulmonary vascular congestion is noted with possible mild bibasilar edema or atelectasis. The visualized skeletal structures are unremarkable. IMPRESSION: Mild central pulmonary vascular congestion with possible mild bibasilar edema or atelectasis. Electronically Signed   By: Lupita Raider M.D.   On: 05/01/2023 14:21   IR Fluoro Guide CV Line Right Result Date: 04/30/2023 INDICATION: 28 year old male with history of rhabdomyolysis requiring central venous access for hemodialysis. EXAM: NON-TUNNELED CENTRAL VENOUS HEMODIALYSIS CATHETER PLACEMENT WITH ULTRASOUND AND FLUOROSCOPIC GUIDANCE COMPARISON:  None Available. MEDICATIONS: None FLUOROSCOPY TIME:  One mGy COMPLICATIONS: None immediate. PROCEDURE: Informed written consent was obtained from the patient after a discussion of the risks, benefits, and alternatives to  treatment. Questions regarding the procedure were encouraged and answered. The right neck and chest were prepped with chlorhexidine in a sterile fashion, and a sterile drape was applied covering the  operative field. Maximum barrier sterile technique with sterile gowns and gloves were used for the procedure. A timeout was performed prior to the initiation of the procedure. After the overlying soft tissues were anesthetized, a small venotomy incision was created and a micropuncture kit was utilized to access the internal jugular vein. Real-time ultrasound guidance was utilized for vascular access including the acquisition of a permanent ultrasound image documenting patency of the accessed vessel. The microwire was utilized to measure appropriate catheter length. A guidewire was advanced to the level of the IVC. Under fluoroscopic guidance, the venotomy was serially dilated, ultimately allowing placement of a 20 cm temporary Trialysis catheter with tip ultimately terminating within the superior aspect of the right atrium. Final catheter positioning was confirmed and documented with a spot radiographic image. The catheter aspirates and flushes normally. The catheter was flushed with appropriate volume heparin dwells. The catheter exit site was secured with a 0 silk retention suture. A dressing was placed. The patient tolerated the procedure well without immediate post procedural complication. IMPRESSION: Successful placement of a right internal jugular approach 20 cm temporary dialysis catheter with tip terminating with in the superior aspect of the right atrium. The catheter is ready for immediate use. PLAN: This catheter may be converted to a tunneled dialysis catheter at a later date as indicated. Marliss Coots, MD [facility] Radiology Electronically Signed   By: Marliss Coots M.D.   On: 04/30/2023 16:28   IR US Guide Vasc Access Right Result Date:  04/30/2023 INDICATION: 28 year old male with history of rhabdomyolysis requiring central venous access for hemodialysis. EXAM: NON-TUNNELED CENTRAL VENOUS HEMODIALYSIS CATHETER PLACEMENT WITH ULTRASOUND AND FLUOROSCOPIC GUIDANCE COMPARISON:  None Available. MEDICATIONS: None FLUOROSCOPY TIME:  One mGy COMPLICATIONS: None immediate. PROCEDURE: Informed written consent was obtained from the patient after a discussion of the risks, benefits, and alternatives to treatment. Questions regarding the procedure were encouraged and answered. The right neck and chest were prepped with chlorhexidine in a sterile fashion, and a sterile drape was applied covering the operative field. Maximum barrier sterile technique with sterile gowns and gloves were used for the procedure. A timeout was performed prior to the initiation of the procedure. After the overlying soft tissues were anesthetized, a small venotomy incision was created and a micropuncture kit was utilized to access the internal jugular vein. Real-time ultrasound guidance was utilized for vascular access including the acquisition of a permanent ultrasound image documenting patency of the accessed vessel. The microwire was utilized to measure appropriate catheter length. A guidewire was advanced to the level of the IVC. Under fluoroscopic guidance, the venotomy was serially dilated, ultimately allowing placement of a 20 cm temporary Trialysis catheter with tip ultimately terminating within the superior aspect of the right atrium. Final catheter positioning was confirmed and documented with a spot radiographic image. The catheter aspirates and flushes normally. The catheter was flushed with appropriate volume heparin dwells. The catheter exit site was secured with a 0 silk retention suture. A dressing was placed. The patient tolerated the procedure well without immediate post procedural complication. IMPRESSION: Successful placement of a right internal jugular approach 20  cm temporary dialysis catheter with tip terminating with in the superior aspect of the right atrium. The catheter is ready for immediate use. PLAN: This catheter may be converted to a tunneled dialysis catheter at a later date as indicated. Marliss Coots, MD [facility] Radiology Electronically Signed   By: Jae Dire.D.  On: 04/30/2023 16:28    Medications: Infusions:  cefTRIAXone (ROCEPHIN)  IV Stopped (05/01/23 1302)    Scheduled Medications:  calcium carbonate  1 tablet Oral BID WC   chlordiazePOXIDE  25 mg Oral TID   Followed by   Melene Muller ON 05/03/2023] chlordiazePOXIDE  25 mg Oral BH-qamhs   Followed by   Melene Muller ON 05/04/2023] chlordiazePOXIDE  25 mg Oral Daily   Chlorhexidine Gluconate Cloth  6 each Topical Q0600   folic acid  1 mg Oral Daily   heparin  5,000 Units Subcutaneous Q8H   ipratropium-albuterol  3 mL Nebulization TID   loratadine  10 mg Oral Daily   multivitamin with minerals  1 tablet Oral Daily   pantoprazole  40 mg Oral BID AC   sodium chloride flush  3 mL Intravenous Q12H   thiamine  100 mg Oral Daily   Vitamin D (Ergocalciferol)  50,000 Units Oral Q7 days    have reviewed scheduled and prn medications.  Physical Exam: General: Ill looking male, somnolent and refusing oxygen. Heart:RRR, s1s2 nl Lungs: Reduced bibasal breath sound Abdomen:soft, Non-tender, non-distended Extremities: Lower extremity edema+ Dialysis Access: Temporary HD catheter.  Jeson Camacho Prasad Alyssah Algeo 05/02/2023,11:34 AM  LOS: 5 days

## 2023-05-02 NOTE — Consult Note (Addendum)
 NAME:  Andrew Page, MRN:  [id number], DOB:  [birth year], LOS: 5 ADMISSION DATE:  04/27/2023, CONSULTATION DATE:  3/13 REFERRING MD:  Janee Morn, CHIEF COMPLAINT:  acute resp failure    History of Present Illness:  28 year old male patient who was admitted to [facility] on 3/8 with chief complaint of nausea and vomiting.  Has a history anxiety depression and substance abuse.  Had been on a recent alcohol binge drinking very heavily for 4 to 5 days and then developed nausea and vomiting 2 to 3 days prior to presentation on the eighth.  He had noted some increased swelling of his right ankle, no recent trauma.  Occasionally smokes marijuana.  In the ER he was afebrile, sodium bicarbonate was 17 creatinine 3.2 anion gap 20 AST greater than 7000 ALT 4942 INR was 1.5 total CK was 26,463 He was admitted to the internal medicine service With a working diagnosis of AKI secondary to rhabdomyolysis, as well as acute liver injury.  In addition to aggressive hydration given the ALI NAC with infusion was started, GI consultation and nephrology consultation were obtained. Over the course of his hospitalization the initial total CK rose to 32,000 creatinine continued to rise, he became an uric.  The elevated LFTs were also felt to be secondary to rhabdo per GI consultation.  On 3/10 serum creatinine had rose yet again up to 8.16 he was developing progressive metabolic acidosis he was referred to interventional radiology and a temporary dialysis catheter was placed in his right internal jugular vein.  He underwent his first plan dialysis treatment on 3/11 on 3/12 he was still an uric and received dialysis once again in the morning rapid response called for shortness of breath, for this he had another 3-hour treatment of IHD with a net fluid removal of 1 L.  His respiratory status had improved following dialysis. On 3/13 patient found to be in worsening acute respiratory distress respiratory rate in the 30s to 40s, systolic  blood pressure in the 140s, chest x-ray look consistent with pulmonary edema however he did spike a fever of 101.8 blood cultures were sent he was placed on broad-spectrum antibiotics, previously he had been on ceftriaxone for possible cellulitis of the arm.  Nephrology was called for urgent dialysis, he was placed on NIPPV and critical care asked to evaluate given progression of his respiratory failure.  Pertinent  Medical History  Depression, anxiety, alcohol consumption.  Significant Hospital Events: Including procedures, antibiotic start and stop dates in addition to other pertinent events   3/8 admitted with rhabdo and AKI started on IV fluids.  Also had acute liver injury this was felt secondary to rhabdo and hypoperfusion.  He had initially been placed on NAC infusion. 3/11 dialysis catheter placed as creatinine continued to climb up over 8, and developed progressive metabolic acidosis received his first round of dialysis on the 11th.   3/12 had second round of dialysis removed 1 L started on ceftriaxone empirically with left arm/elbow/forearm redness, being treated as possible cellulitis 3/13 Low-grade fevers, blood pressure in 140s to 150s systolic, sodium 782, bicarbonate 15, creatinine 10.77 BUN 48, continues to climb anion gap 16, LFTs improving, peaked AST was greater than 70,000, now 142, peaked ALT was 2529 now 666.  Total CK peaked at 32,693, now down to 3392, platelets have remained low, dropping from 129 down to 109-day prior.  Developed new fever, antibiotics widened, chest x-ray consistent with pulmonary edema but started on coverage for possible pneumonia.  Critical care  called after patient being placed on BiPAP and continued marked increased respiratory effort  Interim History / Subjective:  Laying in bed on BiPAP reports he is scared,  Objective   Blood pressure (!) 141/105, pulse (!) 105, temperature 100.2 F (37.9 C), temperature source Oral, resp. rate (!) 49, height 5'  10" (1.778 m), weight 77.1 kg, SpO2 98%.    FiO2 (%):  [50 %] 50 %   Intake/Output Summary (Last 24 hours) at 05/02/2023 1338 Last data filed at 05/02/2023 1140 Gross per 24 hour  Intake 509.13 ml  Output 1000 ml  Net -490.87 ml   Filed Weights   04/30/23 1542 04/30/23 1859 05/02/23 0945  Weight: 79.9 kg 79.9 kg 77.1 kg    Examination: General: 28 year old male patient laying in bed currently with NIPPV in place.  He is tachypneic, has accessory use, and anxious. HENT: Sclera nonicteric no obvious jugular venous distention BiPAP mask in place Lungs: Tachypneic with respiratory rate in the 40s to 50s, does have increased accessory use portable chest x-ray was personally reviewed and demonstrate bilateral pulmonary infiltrates most consistent with pulmonary edema Cardiovascular: Tachycardic regular rhythm without murmur rub or gallop Abdomen: Soft nontender Extremities: Trace lower extremity edema Neuro: Awake appropriate moves all extremities no focal deficits GU: Still has not voided much  Resolved Hospital Problem list     Assessment & Plan:  Acute hypoxic respiratory failure secondary to progressive pulmonary edema with AKI. Plan Continue NIPPV as tolerated, moving to the intensive care in place requires intubation and ventilation Continue supplemental oxygen Volume removal via dialysis Continuous pulse oximetry Getting LE [place], DVT/PE seems unlikely and infiltrates seem to explain his resp distress but still reasonable   New leukocytosis and fever.  Working diagnosis left arm cellulitis and possible HCAP versus aspiration does have bilateral infiltrates.  Antibiotics have been widened to cover for possible aspiration/hcap also being treated for left arms cellulitis with involving the elbow area.  Plan Plan Follow-up pending cultures Agree with current antibiotic regimen, getting atypical coverage with azithromycin, hcap coverage added with Zosyn and vancomycin. Follow-up MRSA  PCR, can probably DC vancomycin if negative Follow-up respiratory viral panel and urine antigens which were sent  AKI secondary to rhabdo and volume depletion from binge drinking Total CKs had been improving unfortunately renal function has yet to improve Plan Continuing to closely monitor chemistries and volume status Dialysis today, may need again within 24 hours depending on pulmonary status Renal dose medications Avoid hypotension or hypertension Additional recommendations as per recommended by nephrology  Acute liver injury.  This was felt secondary to rhabdo and hypovolemia.  His LFTs are improving nicely Plan Continuing supportive care Follow-up a.m. LFTs Adjust medications appropriately/accordingly  History of binge drinking.  Currently no evidence of withdrawal Plan Reasonable to continue Librium taper, also has underlying history of anxiety and depression   Best Practice (right click and "Reselect all SmartList Selections" daily)   Diet/type: NPO DVT prophylaxis prophylactic heparin  Pressure ulcer(s): N/A GI prophylaxis: N/A Lines: Dialysis Catheter Foley:  Yes, and it is still needed Code Status:  full code Last date of multidisciplinary goals of care discussion [per primary ]  Labs   CBC: Recent Labs  Lab 04/27/23 1718 04/28/23 0447 04/29/23 0644 04/30/23 0644 05/01/23 0549 05/02/23 0556 05/02/23 0917  WBC 19.5* 15.5* 12.0* 9.6 10.0 14.6*  --   NEUTROABS 17.0*  --   --   --   --   --   --   HGB  14.8 12.0* 12.4* 11.7* 11.9* 13.5 12.6*  HCT 46.7 36.2* 35.6* 33.2* 33.7* 38.9* 37.0*  MCV 97.9 94.5 89.4 87.8 88.2 88.4  --   PLT 220 180 143* 158 129* 109*  --     Basic Metabolic Panel: Recent Labs  Lab 04/27/23 1824 04/28/23 0447 04/29/23 0644 04/30/23 0644 05/01/23 0549 05/01/23 0550 05/02/23 0555 05/02/23 0556 05/02/23 0917  NA 144 140 138 134*  --  132* 135  --  133*  K 5.1 3.8 3.4* 3.6  --  3.4* 3.7  --  3.5  CL 107 104 102 103  --  99 96*   --   --   CO2 17* 20* 18* 15*  --  22 24  --   --   GLUCOSE 86 123* 86 88  --  89 86  --   --   BUN 24* 31* 40* 48*  --  38* 26*  --   --   CREATININE 3.22* 4.50* 8.16* 10.77*  --  9.87* 8.37*  --   --   CALCIUM 6.6* 6.4* 7.9* 7.7*  --  7.7* 7.9*  --   --   MG 2.0 1.7  --  1.7 1.7  --   --  2.0  --   PHOS  --  3.3  --  2.3*  --  2.7 1.7*  --   --    GFR: Estimated Creatinine Clearance: 13.9 mL/min (A) (by C-G formula based on SCr of 8.37 mg/dL (H)). Recent Labs  Lab 04/29/23 0644 04/30/23 0644 05/01/23 0549 05/02/23 0556  WBC 12.0* 9.6 10.0 14.6*    Liver Function Tests: Recent Labs  Lab 04/28/23 0447 04/29/23 0644 04/30/23 0644 05/01/23 0549 05/01/23 0550 05/02/23 0555 05/02/23 0556  AST >7,000* 2,042* 695* 297*  --   --  142*  ALT 3,529* 1,983* 1,269* 937*  --   --  666*  ALKPHOS 69 74 63 67  --   --  71  BILITOT 1.5* 2.4* 2.2* 2.1*  --   --  1.1  PROT 5.7* 5.3* 5.2* 5.3*  --   --  5.7*  ALBUMIN 3.0* 2.9* 2.9* 2.7* 2.8* 2.8* 2.7*   Recent Labs  Lab 04/27/23 1824  LIPASE 27   No results for input(s): "AMMONIA" in the last 168 hours.  ABG    Component Value Date/Time   PHART 7.414 05/02/2023 0917   PCO2ART 40.5 05/02/2023 0917   PO2ART 72 (L) 05/02/2023 0917   HCO3 25.6 05/02/2023 0917   TCO2 27 05/02/2023 0917   O2SAT 94 05/02/2023 0917     Coagulation Profile: Recent Labs  Lab 04/27/23 2154 04/28/23 0447 04/29/23 0644 04/30/23 0644 05/01/23 0549  INR 1.5* 1.5* 1.3* 1.2 1.1    Cardiac Enzymes: Recent Labs  Lab 04/28/23 0447 04/29/23 0644 04/30/23 0644 05/01/23 0549 05/02/23 0556  CKTOTAL 19,147* 82,956* 14,724* 7,243* 3,392*    HbA1C: No results found for: "HGBA1C"  CBG: Recent Labs  Lab 04/29/23 2151  GLUCAP 85    Review of Systems:   SOB preventing ability to give ROS  Past Medical History:  He,  has a past medical history of Anxiety, Attention deficit hyperactivity disorder (ADHD), Boxers fracture (07/02/2011),  Depression, Inguinal hernia, Oppositional defiant disorder (04/19/2005), Substance abuse (HCC), and Tinea capitis.   Surgical History:   Past Surgical History:  Procedure Laterality Date   HAND SURGERY     HERNIA REPAIR     INGUINAL HERNIA REPAIR Right 12/10/2014   Procedure:  LAPAROSCOPIC RIGHT INGUINAL HERNIA REPAIR WITH MESH;  Surgeon: Axel Filler, MD;  Location: [facility];  Service: General;  Laterality: Right;   INSERTION OF MESH Right 12/10/2014   Procedure: INSERTION OF MESH;  Surgeon: Axel Filler, MD;  Location: [facility];  Service: General;  Laterality: Right;   IR FLUORO GUIDE CV LINE RIGHT  04/30/2023   IR US GUIDE VASC ACCESS RIGHT  04/30/2023     Social History:   reports that he has been smoking cigarettes. He has a 4 pack-year smoking history. He has never used smokeless tobacco. He reports that he does not currently use alcohol. He reports current drug use. Frequency: 7.00 times per week. Drug: Marijuana.   Family History:  His family history includes Colon cancer in an other family member; Congestive Heart Failure in his maternal grandfather; Diabetes in his maternal grandfather and paternal grandfather; Eczema in his brother and another family member; Hypertension in his mother. There is no history of Rectal cancer or Stomach cancer.   Allergies No Known Allergies   Home Medications  Prior to Admission medications   Not on File     Critical care time: 34 min

## 2023-05-03 ENCOUNTER — Inpatient Hospital Stay (HOSPITAL_COMMUNITY): Payer: MEDICAID

## 2023-05-03 DIAGNOSIS — I5031 Acute diastolic (congestive) heart failure: Secondary | ICD-10-CM

## 2023-05-03 DIAGNOSIS — R609 Edema, unspecified: Secondary | ICD-10-CM

## 2023-05-03 LAB — ECHOCARDIOGRAM COMPLETE
AR max vel: 1.59 cm2
AV Peak grad: 31.8 mmHg
Ao pk vel: 2.82 m/s
Area-P 1/2: 5.13 cm2
Calc EF: 51.7 %
Height: 70 in
MV VTI: 3.23 cm2
S' Lateral: 3.5 cm
Single Plane A2C EF: 55.2 %
Single Plane A4C EF: 48 %
Weight: 2574.97 [oz_av]

## 2023-05-03 LAB — RENAL FUNCTION PANEL
Albumin: 2.2 g/dL — ABNORMAL LOW (ref 3.5–5.0)
Anion gap: 13 (ref 5–15)
BUN: 24 mg/dL — ABNORMAL HIGH (ref 6–20)
CO2: 26 mmol/L (ref 22–32)
Calcium: 7.4 mg/dL — ABNORMAL LOW (ref 8.9–10.3)
Chloride: 94 mmol/L — ABNORMAL LOW (ref 98–111)
Creatinine, Ser: 7.7 mg/dL — ABNORMAL HIGH (ref 0.61–1.24)
GFR, Estimated: 9 mL/min — ABNORMAL LOW (ref >60)
Glucose, Bld: 109 mg/dL — ABNORMAL HIGH (ref 70–99)
Phosphorus: 3.4 mg/dL (ref 2.5–4.6)
Potassium: 3.5 mmol/L (ref 3.5–5.1)
Sodium: 133 mmol/L — ABNORMAL LOW (ref 135–145)

## 2023-05-03 LAB — BASIC METABOLIC PANEL
Anion gap: 13 (ref 5–15)
BUN: 28 mg/dL — ABNORMAL HIGH (ref 6–20)
CO2: 26 mmol/L (ref 22–32)
Calcium: 8 mg/dL — ABNORMAL LOW (ref 8.9–10.3)
Chloride: 97 mmol/L — ABNORMAL LOW (ref 98–111)
Creatinine, Ser: 8.92 mg/dL — ABNORMAL HIGH (ref 0.61–1.24)
GFR, Estimated: 8 mL/min — ABNORMAL LOW (ref >60)
Glucose, Bld: 129 mg/dL — ABNORMAL HIGH (ref 70–99)
Potassium: 3.6 mmol/L (ref 3.5–5.1)
Sodium: 136 mmol/L (ref 135–145)

## 2023-05-03 LAB — PROCALCITONIN: Procalcitonin: 4.44 ng/mL

## 2023-05-03 LAB — HEPATIC FUNCTION PANEL
ALT: 390 U/L — ABNORMAL HIGH (ref 0–44)
AST: 68 U/L — ABNORMAL HIGH (ref 15–41)
Albumin: 2.2 g/dL — ABNORMAL LOW (ref 3.5–5.0)
Alkaline Phosphatase: 59 U/L (ref 38–126)
Bilirubin, Direct: 0.2 mg/dL (ref 0.0–0.2)
Indirect Bilirubin: 0.8 mg/dL (ref 0.3–0.9)
Total Bilirubin: 1 mg/dL (ref 0.0–1.2)
Total Protein: 5.2 g/dL — ABNORMAL LOW (ref 6.5–8.1)

## 2023-05-03 LAB — CBC
HCT: 30.8 % — ABNORMAL LOW (ref 39.0–52.0)
Hemoglobin: 10.5 g/dL — ABNORMAL LOW (ref 13.0–17.0)
MCH: 31 pg (ref 26.0–34.0)
MCHC: 34.1 g/dL (ref 30.0–36.0)
MCV: 90.9 fL (ref 80.0–100.0)
Platelets: 116 10*3/uL — ABNORMAL LOW (ref 150–400)
RBC: 3.39 MIL/uL — ABNORMAL LOW (ref 4.22–5.81)
RDW: 13.8 % (ref 11.5–15.5)
WBC: 13.4 10*3/uL — ABNORMAL HIGH (ref 4.0–10.5)
nRBC: 0 % (ref 0.0–0.2)

## 2023-05-03 LAB — IMMUNOGLOBULINS A/E/G/M, SERUM
IgA: 391 mg/dL — ABNORMAL HIGH (ref 90–386)
IgE (Immunoglobulin E), Serum: 6 [IU]/mL (ref 6–495)
IgG (Immunoglobin G), Serum: 845 mg/dL (ref 603–1613)
IgM (Immunoglobulin M), Srm: 128 mg/dL (ref 20–172)

## 2023-05-03 LAB — CK: Total CK: 1205 U/L — ABNORMAL HIGH (ref 49–397)

## 2023-05-03 MED ORDER — AZITHROMYCIN 500 MG PO TABS
500.0000 mg | ORAL_TABLET | Freq: Every day | ORAL | Status: DC
  Filled 2023-05-03: qty 1

## 2023-05-03 MED ORDER — DOXYCYCLINE HYCLATE 100 MG PO TABS
100.0000 mg | ORAL_TABLET | Freq: Two times a day (BID) | ORAL | Status: AC
  Administered 2023-05-03 – 2023-05-07 (×10): 100 mg via ORAL
  Filled 2023-05-03 (×10): qty 1

## 2023-05-03 MED ORDER — FUROSEMIDE 10 MG/ML IJ SOLN
80.0000 mg | Freq: Once | INTRAMUSCULAR | Status: AC
  Administered 2023-05-03: 80 mg via INTRAVENOUS
  Filled 2023-05-03: qty 8

## 2023-05-03 MED ORDER — POTASSIUM CHLORIDE CRYS ER 10 MEQ PO TBCR
10.0000 meq | EXTENDED_RELEASE_TABLET | Freq: Once | ORAL | Status: AC
  Administered 2023-05-03: 10 meq via ORAL
  Filled 2023-05-03: qty 1

## 2023-05-03 MED ORDER — SODIUM CHLORIDE 0.9 % IV SOLN
2.0000 g | INTRAVENOUS | Status: AC
Start: 2023-05-03 — End: 2023-05-06
  Administered 2023-05-03 – 2023-05-05 (×3): 2 g via INTRAVENOUS
  Filled 2023-05-03 (×3): qty 20

## 2023-05-03 NOTE — Plan of Care (Signed)
   Problem: Clinical Measurements: Goal: Will remain free from infection Outcome: Progressing Goal: Respiratory complications will improve Outcome: Progressing Goal: Cardiovascular complication will be avoided Outcome: Progressing   Problem: Activity: Goal: Risk for activity intolerance will decrease Outcome: Progressing   Problem: Nutrition: Goal: Adequate nutrition will be maintained Outcome: Progressing

## 2023-05-03 NOTE — Progress Notes (Signed)
  Echocardiogram 2D Echocardiogram has been performed.  Andrew Page 05/03/2023, 9:16 AM

## 2023-05-03 NOTE — Consult Note (Addendum)
 Advanced Heart Failure Team Consult Note   Primary Physician: Patient, No Pcp Per Cardiologist:  None  Reason for Consultation: Acute HFpEF and severe MR  HPI:    Andrew Page is seen today for evaluation of acute HFpEF and severe MR at the request of PCCM. 28 y.o. male with history of depression/anxiety, ETOH abuse.   Presented 04/27/23 with nausea and vomiting after drinking heavily for 4-5 days. Had apparently also taken a street drug Admitted with acute liver injury and AKI 2/2 rhabdomyolysis. Renal function worsened and had progressive metabolic acidosis. Started on HD 03/11. Developed respiratory distress on 03/13, placed on BiPAP and  went for urgent dialysis. Also had fevers and was placed on broad-spectrum abx for possible cellulitis. CCM consulted and was transferred to the ICU. Abx broadened to cover CAP. Improved significantly today after volume removal. 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   Advanced HF asked to weigh in regarding his HFpEF/severe MR.  Home Medications Prior to Admission medications   Not on File    Past Medical History: Past Medical History:  Diagnosis Date   Anxiety    Attention deficit hyperactivity disorder (ADHD)    Boxers fracture 07/02/2011   Cast applied in the ED. Removed by Ortho.   Depression    Inguinal hernia    RIGHT   Oppositional defiant disorder 04/19/2005   Borderline/ low average intelligence (Questionable)   Substance abuse (HCC)    Tinea capitis     Past Surgical History: Past Surgical History:  Procedure Laterality Date   HAND SURGERY     HERNIA REPAIR     INGUINAL HERNIA REPAIR Right 12/10/2014   Procedure: LAPAROSCOPIC RIGHT INGUINAL HERNIA REPAIR WITH MESH;  Surgeon: Axel Filler, MD;  Location: [facility];  Service: General;  Laterality: Right;    INSERTION OF MESH Right 12/10/2014   Procedure: INSERTION OF MESH;  Surgeon: Axel Filler, MD;  Location: [facility];  Service: General;  Laterality: Right;   IR FLUORO GUIDE CV LINE RIGHT  04/30/2023   IR US GUIDE VASC ACCESS RIGHT  04/30/2023    Family History: Family History  Problem Relation Age of Onset   Hypertension Mother    Eczema Brother        Twin   Diabetes Maternal Grandfather    Congestive Heart Failure Maternal Grandfather    Diabetes Paternal Grandfather    Colon cancer Other    Eczema Other        elbows, behind the knees, worse in the summer. Previously treated with triamcinolonce 0.1% cream with sucess.   Rectal cancer Neg Hx    Stomach cancer Neg Hx     Social History: Social History   Socioeconomic History   Marital status: Single    Spouse name: Not on file   Number of children: Not on file   Years of education: Not on file   Highest education level: Not on file  Occupational History   Not on file  Tobacco Use   Smoking status: Every Day    Current packs/day: 1.00    Average packs/day: 1 pack/day for 4.0 years (4.0 ttl pk-yrs)    Types: Cigarettes   Smokeless tobacco: Never  Vaping Use   Vaping status: Some Days  Substance and Sexual Activity   Alcohol use: Not Currently    Comment: 1  bottle every 2-3 days   Drug use: Yes    Frequency: 7.0 times per week    Types: Marijuana   Sexual activity: Yes    Birth control/protection: Condom  Other Topics Concern   Not on file  Social History Narrative   Lives in [place] with mother twin brother, 2 older brothers. Father not involved. Twin brother murdered in May 2019.   Social Drivers of Corporate investment banker Strain: Not on file  Food Insecurity: No Food Insecurity (04/28/2023)   Hunger Vital Sign    Worried About Running Out of Food in the Last Year: Never true    Ran Out of Food in the Last Year: Never true  Transportation Needs: No Transportation Needs (04/28/2023)   PRAPARE -  Administrator, Civil Service (Medical): No    Lack of Transportation (Non-Medical): No  Physical Activity: Not on file  Stress: Not on file  Social Connections: Not on file    Allergies:  No Known Allergies  Objective:    Vital Signs:   Temp:  [98.3 F (36.8 C)-99.7 F (37.6 C)] 98.6 F (37 C) (03/14 1100) Pulse Rate:  [90-110] 90 (03/14 1330) Resp:  [11-49] 30 (03/14 1330) BP: (112-148)/(73-113) 130/104 (03/14 1330) SpO2:  [94 %-100 %] 99 % (03/14 1330) Weight:  [73 kg-77 kg] 73 kg (03/13 1915) Last BM Date : 05/02/23  Weight change: Filed Weights   05/02/23 0945 05/02/23 1512 05/02/23 1915  Weight: 77.1 kg 77 kg 73 kg    Intake/Output:   Intake/Output Summary (Last 24 hours) at 05/03/2023 1502 Last data filed at 05/03/2023 1200 Gross per 24 hour  Intake 1269.98 ml  Output 4000 ml  Net -2730.02 ml      Physical Exam    General:  Well appearing male Neck: JVP 8-9 Cor: Regular rate & rhythm. No rubs, gallops or murmurs. Lungs: clear Abdomen: soft, nontender, nondistended.  Extremities: no edema Neuro: alert & orientedx3. Affect pleasant   Telemetry   SR 90s  Labs   Basic Metabolic Panel: Recent Labs  Lab 04/27/23 1824 04/28/23 0447 04/29/23 8413 04/30/23 2440 05/01/23 0549 05/01/23 0550 05/02/23 0555 05/02/23 0556 05/02/23 0917 05/03/23 0536  NA 144 140 138 134*  --  132* 135  --  133* 133*  K 5.1 3.8 3.4* 3.6  --  3.4* 3.7  --  3.5 3.5  CL 107 104 102 103  --  99 96*  --   --  94*  CO2 17* 20* 18* 15*  --  22 24  --   --  26  GLUCOSE 86 123* 86 88  --  89 86  --   --  109*  BUN 24* 31* 40* 48*  --  38* 26*  --   --  24*  CREATININE 3.22* 4.50* 8.16* 10.77*  --  9.87* 8.37*  --   --  7.70*  CALCIUM 6.6* 6.4* 7.9* 7.7*  --  7.7* 7.9*  --   --  7.4*  MG 2.0 1.7  --  1.7 1.7  --   --  2.0  --   --   PHOS  --  3.3  --  2.3*  --  2.7 1.7*  --   --  3.4    Liver Function Tests: Recent Labs  Lab 04/29/23 0644 04/30/23 0644  05/01/23 0549 05/01/23 0550 05/02/23 0555 05/02/23 0556 05/03/23 0536  AST 2,042* 695* 297*  --   --  142*  68*  ALT 1,983* 1,269* 937*  --   --  666* 390*  ALKPHOS 74 63 67  --   --  71 59  BILITOT 2.4* 2.2* 2.1*  --   --  1.1 1.0  PROT 5.3* 5.2* 5.3*  --   --  5.7* 5.2*  ALBUMIN 2.9* 2.9* 2.7* 2.8* 2.8* 2.7* 2.2*  2.2*   Recent Labs  Lab 04/27/23 1824  LIPASE 27   No results for input(s): "AMMONIA" in the last 168 hours.  CBC: Recent Labs  Lab 04/27/23 1718 04/28/23 0447 04/29/23 0644 04/30/23 0644 05/01/23 0549 05/02/23 0556 05/02/23 0917 05/03/23 0536  WBC 19.5*   < > 12.0* 9.6 10.0 14.6*  --  13.4*  NEUTROABS 17.0*  --   --   --   --   --   --   --   HGB 14.8   < > 12.4* 11.7* 11.9* 13.5 12.6* 10.5*  HCT 46.7   < > 35.6* 33.2* 33.7* 38.9* 37.0* 30.8*  MCV 97.9   < > 89.4 87.8 88.2 88.4  --  90.9  PLT 220   < > 143* 158 129* 109*  --  116*   < > = values in this interval not displayed.    Cardiac Enzymes: Recent Labs  Lab 04/29/23 0644 04/30/23 0644 05/01/23 0549 05/02/23 0556 05/03/23 0536  CKTOTAL 24,888* 14,724* 7,243* 3,392* 1,205*    BNP: BNP (last 3 results) No results for input(s): "BNP" in the last 8760 hours.  ProBNP (last 3 results) No results for input(s): "PROBNP" in the last 8760 hours.   CBG: Recent Labs  Lab 04/29/23 2151 05/02/23 1434  GLUCAP 85 106*    Coagulation Studies: Recent Labs    05/01/23 0549  LABPROT 13.9  INR 1.1     Imaging   ECHOCARDIOGRAM COMPLETE Result Date: 05/03/2023    ECHOCARDIOGRAM REPORT   Patient Name:   MAVEN VARELAS Date of Exam: 05/03/2023 Medical Rec #:  [id number]       Height:       70.0 in Accession #:    [id number]      Weight:       160.9 lb Date of Birth:  Jul 04, 1995       BSA:          1.903 m Patient Age:    27 years        BP:           128/93 mmHg Patient Gender: M               HR:           97 bpm. Exam Location:  Inpatient Procedure: 2D Echo, Cardiac Doppler and Color Doppler  (Both Spectral and Color            Flow Doppler were utilized during procedure). Indications:    I50.40* Unspecified combined systolic (congestive) and diastolic                 (congestive) heart failure  History:        Patient has no prior history of Echocardiogram examinations.                 Signs/Symptoms:Altered Mental Status. ETOH. Substance abuse.  Sonographer:    Sheralyn Boatman RDCS Referring Phys: [id number] SUDHAM CHAND IMPRESSIONS  1. Left ventricular ejection fraction, by estimation, is 55 to 60%. The left ventricle has normal function. The left ventricle has no regional wall motion abnormalities.  The left ventricular internal cavity size was mildly dilated. Indeterminate diastolic filling due to E-A fusion. There is the interventricular septum is flattened in systole and diastole, consistent with right ventricular pressure and volume overload.  2. Right ventricular systolic function is normal. The right ventricular size is normal. There is moderately elevated pulmonary artery systolic pressure. The estimated right ventricular systolic pressure is 49.1 mmHg.  3. Left atrial size was moderately dilated.  4. 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 No evidence of mitral stenosis.  5. Tricuspid valve regurgitation is moderate.  6. The aortic valve is grossly normal. Aortic valve regurgitation is not visualized. No aortic stenosis is present. Conclusion(s)/Recommendation(s): The mitral valve is abnormal with severe central MR> Consider TEE to further evalaute. FINDINGS  Left Ventricle: Left ventricular ejection fraction, by estimation, is 55 to 60%. The left ventricle has normal function. The left ventricle has no regional wall motion abnormalities. The left ventricular internal cavity size was mildly dilated. There is  no left ventricular hypertrophy. The interventricular septum is flattened in systole and diastole, consistent with right ventricular pressure and volume overload.  Indeterminate diastolic filling due to E-A fusion. Right Ventricle: The right ventricular size is normal. No increase in right ventricular wall thickness. Right ventricular systolic function is normal. There is moderately elevated pulmonary artery systolic pressure. The tricuspid regurgitant velocity is 2.92 m/s, and with an assumed right atrial pressure of 15 mmHg, the estimated right ventricular systolic pressure is 49.1 mmHg. Left Atrium: Left atrial size was moderately dilated. Right Atrium: Right atrial size was normal in size. Pericardium: There is no evidence of pericardial effusion. 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 No evidence of mitral valve stenosis. MV peak gradient, 8.2 mmHg. The mean mitral valve gradient is 3.0 mmHg. Tricuspid Valve: The tricuspid valve is normal in structure. Tricuspid valve regurgitation is moderate . No evidence of tricuspid stenosis. Aortic Valve: The aortic valve is grossly normal. Aortic valve regurgitation is not visualized. No aortic stenosis is present. Aortic valve peak gradient measures 31.8 mmHg. Pulmonic Valve: The pulmonic valve was normal in structure. Pulmonic valve regurgitation is mild. No evidence of pulmonic stenosis. Aorta: The aortic root and ascending aorta are structurally normal, with no evidence of dilitation. IAS/Shunts: There is right bowing of the interatrial septum, suggestive of elevated left atrial pressure. No atrial level shunt detected by color flow Doppler.  LEFT VENTRICLE PLAX 2D LVIDd:         4.80 cm      Diastology LVIDs:         3.50 cm      LV e' medial:    9.36 cm/s LV PW:         1.25 cm      LV E/e' medial:  11.6 LV IVS:        1.00 cm      LV e' lateral:   13.80 cm/s LVOT diam:     2.20 cm      LV E/e' lateral: 7.9 LV SV:         75 LV SV Index:   40 LVOT Area:     3.80 cm  LV Volumes (MOD) LV vol d, MOD A2C: 126.0 ml LV vol d, MOD A4C: 96.7 ml LV vol s, MOD A2C:  56.5 ml LV vol s, MOD A4C: 50.3 ml LV SV MOD A2C:     69.5 ml LV SV MOD A4C:  96.7 ml LV SV MOD BP:      59.4 ml RIGHT VENTRICLE             IVC RV S prime:     14.30 cm/s  IVC diam: 2.40 cm TAPSE (M-mode): 1.9 cm LEFT ATRIUM             Index        RIGHT ATRIUM           Index LA diam:        3.60 cm 1.89 cm/m   RA Area:     15.40 cm LA Vol (A2C):   37.7 ml 19.81 ml/m  RA Volume:   37.10 ml  19.49 ml/m LA Vol (A4C):   49.4 ml 25.93 ml/m LA Biplane Vol: 52.1 ml 27.38 ml/m  AORTIC VALVE AV Area (Vmax): 1.59 cm AV Vmax:        282.00 cm/s AV Peak Grad:   31.8 mmHg LVOT Vmax:      118.00 cm/s LVOT Vmean:     80.700 cm/s LVOT VTI:       0.198 m  AORTA Ao Root diam: 2.60 cm Ao Asc diam:  3.60 cm MITRAL VALVE                TRICUSPID VALVE MV Area (PHT): 5.13 cm     TR Peak grad:   34.1 mmHg MV Area VTI:   3.23 cm     TR Vmax:        292.00 cm/s MV Peak grad:  8.2 mmHg MV Mean grad:  3.0 mmHg     SHUNTS MV Vmax:       1.43 m/s     Systemic VTI:  0.20 m MV Vmean:      76.4 cm/s    Systemic Diam: 2.20 cm MV Decel Time: 148 msec MV E velocity: 109.00 cm/s MV A velocity: 68.50 cm/s MV E/A ratio:  1.59 Arvilla Meres MD Electronically signed by Arvilla Meres MD Signature Date/Time: 05/03/2023/10:41:34 AM    Final    DG Chest Port 1 View Result Date: 05/03/2023 CLINICAL DATA:  Pulmonary edema. EXAM: PORTABLE CHEST 1 VIEW COMPARISON:  May 02, 2023. FINDINGS: The heart size and mediastinal contours are within normal limits. Stable bilateral opacities are noted concerning for pneumonia or edema. Right internal jugular catheter is unchanged. The visualized skeletal structures are unremarkable. IMPRESSION: Stable bilateral lung opacities as noted above. Electronically Signed   By: Lupita Raider M.D.   On: 05/03/2023 10:13     Medications:     Current Medications:  calcium carbonate  1 tablet Oral BID WC   Chlorhexidine Gluconate Cloth  6 each Topical Q0600   doxycycline  100 mg Oral Q12H   folic  acid  1 mg Oral Daily   heparin  5,000 Units Subcutaneous Q8H   ipratropium-albuterol  3 mL Nebulization TID   loratadine  10 mg Oral Daily   multivitamin with minerals  1 tablet Oral Daily   pantoprazole  40 mg Oral BID AC   sodium chloride flush  3 mL Intravenous Q12H   thiamine  100 mg Oral Daily   Vitamin D (Ergocalciferol)  50,000 Units Oral Q7 days    Infusions:  cefTRIAXone (ROCEPHIN)  IV Stopped (05/03/23 1155)      Patient Profile   28 y.o. male with no prior cardiac history admitted with AKI and acute liver injury 2/2 rhabdomyolysis in setting of ETOH abuse.  Assessment/Plan  Acute HFpEF? -No known history of CHF -Echo today: EF 55-60%, interventricular septum flattened in systole and diastole c/w RV pressure and volume overload, RVSP 49 mmHg, myxomatous appearing MV with severe MR.  MR did not appear severe on Dr. Alford Highland read.  -Had emergent HD yesterday with improvement in clinical status. No HD today. Trialing IV lasix, received 80 mg today. Give another 80 IV now. Making some urine, not charted.  -Consider repeat echo vs TEE next week once volume optimized.  2. MR -Read as severe on echo. -? If MR appeared more severe in setting of volume overload. -Repeat echo vs TEE next week  3. Acute hypoxic respiratory failure -Initially required BiPAP -Had emergent HD yesterday with 4L volume off -CCM treating empirically for CAP -Marked improvement with volume removal  AKI and acute liver injury 2/2 rhabdo R arm cellulitis ETOH abuse -Management per primary team  [facility] to follow once out of the ICU.    Length of Stay: 6  FINCH, LINDSAY N, PA-C  05/03/2023, 3:02 PM  Advanced Heart Failure Team Pager [phone] (M-F; 7a - 5p)  Please contact [facility] for night-coverage after hours (4p -7a ) and weekends on [email or web address]   Patient seen with PA, I formulated the plan and agree with the above note.    History as noted above.  Presented after ETOH  binge with rhabdomyolysis (peak CK 32,000) and related AKI with creatinine up to 8.16.  Also with markedly elevated transaminases also thought to be related to rhabdomyolysis. He was dialyzed on 3/11 and 3/12, but now making urine and creatinine starting to trend down (7.7 today).   Cardiology was asked to see because of abnormal echo.  I reviewed the echo, mildly dilated LV with EF 55%, mildly D-shaped IV septum with normal RV size/function, PASP 49, moderate TR, moderate-severe MR with dilated IVC.   General: NAD Neck: JVP 8-9 cm, no thyromegaly or thyroid nodule.  Lungs: Clear to auscultation bilaterally with normal respiratory effort. CV: Nondisplaced PMI.  Heart regular S1/S2, no S3/S4, no murmur.  1+ ankle edema.  No carotid bruit.  Normal pedal pulses.  Abdomen: Soft, nontender, no hepatosplenomegaly, no distention.  Skin: Intact without lesions or rashes.  Neurologic: Alert and oriented x 3.  Psych: Normal affect. Extremities: No clubbing or cyanosis.  HEENT: Normal.   I reviewed the echo, the mitral valve does not look markedly abnormal but there is moderate-severe MR.  There is evidence for RV volume overload with mildly D-shaped septum and IVC is dilated.  The patient does not have a significant murmur on exam.  He is volume overloaded on exam in setting of AKI.  Currently, we are watching for recovery from rhabdomyolysis-related AKI, he had a dose of IV Lasix this morning.  - I would give another dose of IV Lasix this evening.  - Once volume status is improved, I would recommend repeating the echo to reassess MR as this may be more severe in the setting of acute volume loading.  If MR still looks > moderate, will need TEE.   Marca Ancona 05/03/2023 5:40 PM

## 2023-05-03 NOTE — Progress Notes (Signed)
 [facility] NEPHROLOGY PROGRESS NOTE  Assessment/ Plan: Pt is a 28 y.o. yo male  with alcohol abuse presented with rhabdomyolysis, AKI and features of liver injury.   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  Started IV fluid without major urine output and worsening renal labs.   Started HD on 3/12 after placement of temporary HD catheter by IR. HD 3/11 and 3/12-  now seemingly starting to make some urine.  Will hold HD today and follow -  give diuretic     #Metabolic acidosis: Corrected with dialysis.   #Acute transaminitis, seen by GI thought to be due to rhabdomyolysis. Improved    #Alcohol abuse: Continue vitamins and withdrawal protocol.  Supportive care.   #Hypokalemia: Adjust potassium bath during HD.  Drinking OJ this AM  #Acute febrile, acute hypoxic respiratory failure: Pending chest x-ray.  Concerning for infection however respiratory viral panel negative.  UF with dialysis.     Subjective:  Had HD yesterday -  removed 4 liters-  much better resp status-   UOP not well recorded-  he says he voided 600 this AM.   labs reflective of HD   Objective Vital signs in last 24 hours: Vitals:   05/03/23 0600 05/03/23 0630 05/03/23 0700 05/03/23 0730  BP: 118/85 (!) 137/113 (!) 128/93   Pulse: 94 93 94   Resp: (!) 26 (!) 40 (!) 47   Temp:    98.6 F (37 C)  TempSrc:    Oral  SpO2: 98% 96% 95%   Weight:      Height:       Weight change:   Intake/Output Summary (Last 24 hours) at 05/03/2023 0755 Last data filed at 05/03/2023 0600 Gross per 24 hour  Intake 1120.36 ml  Output 4000 ml  Net -2879.64 ml       Labs: RENAL PANEL Recent Labs  Lab 04/27/23 1824 04/28/23 0447 04/29/23 0644 04/30/23 0644 05/01/23 0549 05/01/23 0550 05/02/23 0555 05/02/23 0556 05/02/23 0917 05/03/23 0536  NA 144 140 138 134*   --  132* 135  --  133* 133*  K 5.1 3.8 3.4* 3.6  --  3.4* 3.7  --  3.5 3.5  CL 107 104 102 103  --  99 96*  --   --  94*  CO2 17* 20* 18* 15*  --  22 24  --   --  26  GLUCOSE 86 123* 86 88  --  89 86  --   --  109*  BUN 24* 31* 40* 48*  --  38* 26*  --   --  24*  CREATININE 3.22* 4.50* 8.16* 10.77*  --  9.87* 8.37*  --   --  7.70*  CALCIUM 6.6* 6.4* 7.9* 7.7*  --  7.7* 7.9*  --   --  7.4*  MG 2.0 1.7  --  1.7 1.7  --   --  2.0  --   --   PHOS  --  3.3  --  2.3*  --  2.7 1.7*  --   --  3.4  ALBUMIN 3.7 3.0* 2.9* 2.9* 2.7* 2.8* 2.8* 2.7*  --  2.2*  2.2*    Liver Function Tests: Recent Labs  Lab 05/01/23 0549 05/01/23 0550 05/02/23 0555 05/02/23 0556 05/03/23 0536  AST 297*  --   --  142* 68*  ALT 937*  --   --  666* 390*  ALKPHOS 67  --   --  71 59  BILITOT 2.1*  --   --  1.1 1.0  PROT 5.3*  --   --  5.7* 5.2*  ALBUMIN 2.7*   < > 2.8* 2.7* 2.2*  2.2*   < > = values in this interval not displayed.   Recent Labs  Lab 04/27/23 1824  LIPASE 27   No results for input(s): "AMMONIA" in the last 168 hours. CBC: Recent Labs    04/29/23 1200 04/30/23 0644 05/01/23 0549 05/02/23 0556 05/02/23 0917 05/03/23 0536  HGB  --  11.7* 11.9* 13.5 12.6* 10.5*  MCV  --  87.8 88.2 88.4  --  90.9  VITAMINB12 4,105*  --   --   --   --   --   FOLATE >40.0  --   --   --   --   --   TIBC 259  --   --   --   --   --   IRON 58  --   --   --   --   --     Cardiac Enzymes: Recent Labs  Lab 04/29/23 0644 04/30/23 0644 05/01/23 0549 05/02/23 0556 05/03/23 0536  CKTOTAL 24,888* 14,724* 7,243* 3,392* 1,205*   CBG: Recent Labs  Lab 04/29/23 2151 05/02/23 1434  GLUCAP 85 106*    Iron Studies:  No results for input(s): "IRON", "TIBC", "TRANSFERRIN", "FERRITIN" in the last 72 hours.  Studies/Results: DG CHEST PORT 1 VIEW Result Date: 05/02/2023 CLINICAL DATA:  [id number] Hypoxia [id number] EXAM: PORTABLE CHEST 1 VIEW COMPARISON:  05/01/2023 FINDINGS: Right IJ approach central venous  catheter terminating at the level of the right atrium. Heart size is normal. Progressive hazy bilateral perihilar airspace opacities. No pleural effusion or pneumothorax. IMPRESSION: Progressive hazy bilateral perihilar airspace opacities, favoring pulmonary edema over atypical/viral infection. Electronically Signed   By: Duanne Guess D.O.   On: 05/02/2023 12:43   DG Forearm Left Result Date: 05/01/2023 CLINICAL DATA:  Left arm swelling. EXAM: LEFT FOREARM - 2 VIEW COMPARISON:  None Available. FINDINGS: There is no evidence of fracture or other focal bone lesions. Soft tissues are unremarkable. IMPRESSION: Negative. Electronically Signed   By: Lupita Raider M.D.   On: 05/01/2023 14:24   DG ELBOW COMPLETE LEFT (3+VIEW) Result Date: 05/01/2023 CLINICAL DATA:  Left elbow pain and swelling. EXAM: LEFT ELBOW - COMPLETE 3+ VIEW COMPARISON:  None Available. FINDINGS: There is no evidence of fracture, dislocation, or joint effusion. There is no evidence of arthropathy or other focal bone abnormality. Soft tissues are unremarkable. IMPRESSION: Negative. Electronically Signed   By: Lupita Raider M.D.   On: 05/01/2023 14:23   DG CHEST PORT 1 VIEW Result Date: 05/01/2023 CLINICAL DATA:  Shortness of breath. EXAM: PORTABLE CHEST 1 VIEW COMPARISON:  April 28, 2023. FINDINGS: The heart size and mediastinal contours are within normal limits. Right internal jugular catheter is noted with tip in expected position of right atrium. Mild central pulmonary vascular congestion is noted with possible mild bibasilar edema or atelectasis. The visualized skeletal structures are unremarkable. IMPRESSION: Mild central pulmonary vascular congestion with possible mild bibasilar edema or atelectasis. Electronically Signed   By: Lupita Raider M.D.   On: 05/01/2023 14:21    Medications: Infusions:  azithromycin 500 mg (05/02/23 1359)   piperacillin-tazobactam (ZOSYN)  IV Stopped (05/03/23 0234)    Scheduled Medications:   calcium carbonate  1 tablet Oral BID WC  Chlorhexidine Gluconate Cloth  6 each Topical Q0600   folic acid  1 mg Oral Daily   heparin  5,000 Units Subcutaneous Q8H   ipratropium-albuterol  3 mL Nebulization TID   loratadine  10 mg Oral Daily   multivitamin with minerals  1 tablet Oral Daily   pantoprazole  40 mg Oral BID AC   sodium chloride flush  3 mL Intravenous Q12H   thiamine  100 mg Oral Daily   vancomycin variable dose per unstable renal function (pharmacist dosing)   Does not apply See admin instructions   Vitamin D (Ergocalciferol)  50,000 Units Oral Q7 days    have reviewed scheduled and prn medications.  Physical Exam: General: Ill looking male, somnolent and refusing oxygen. Heart:RRR, s1s2 nl Lungs: Reduced bibasal breath sound Abdomen:soft, Non-tender, non-distended Extremities: Lower extremity edema+ Dialysis Access: Temporary HD catheter placed 3/12  Amine Adelson A Pike [place] 05/03/2023,7:55 AM  LOS: 6 days

## 2023-05-03 NOTE — Progress Notes (Signed)
 NAME:  Hollis Tuller, MRN:  [id number], DOB:  [birth year], LOS: 6 ADMISSION DATE:  04/27/2023, CONSULTATION DATE:  3/13 REFERRING MD:  Janee Morn, CHIEF COMPLAINT:  acute resp failure    History of Present Illness:  28 year old male patient who was admitted to [facility] on 3/8 with chief complaint of nausea and vomiting.  Has a history anxiety depression and substance abuse.  Had been on a recent alcohol binge drinking very heavily for 4 to 5 days and then developed nausea and vomiting 2 to 3 days prior to presentation on the eighth.  He had noted some increased swelling of his right ankle, no recent trauma.  Occasionally smokes marijuana.  In the ER he was afebrile, sodium bicarbonate was 17 creatinine 3.2 anion gap 20 AST greater than 7000 ALT 4942 INR was 1.5 total CK was 26,463 He was admitted to the internal medicine service With a working diagnosis of AKI secondary to rhabdomyolysis, as well as acute liver injury.  In addition to aggressive hydration given the ALI NAC with infusion was started, GI consultation and nephrology consultation were obtained. Over the course of his hospitalization the initial total CK rose to 32,000 creatinine continued to rise, he became an uric.  The elevated LFTs were also felt to be secondary to rhabdo per GI consultation.  On 3/10 serum creatinine had rose yet again up to 8.16 he was developing progressive metabolic acidosis he was referred to interventional radiology and a temporary dialysis catheter was placed in his right internal jugular vein.  He underwent his first plan dialysis treatment on 3/11 on 3/12 he was still an uric and received dialysis once again in the morning rapid response called for shortness of breath, for this he had another 3-hour treatment of IHD with a net fluid removal of 1 L.  His respiratory status had improved following dialysis. On 3/13 patient found to be in worsening acute respiratory distress respiratory rate in the 30s to 40s, systolic  blood pressure in the 140s, chest x-ray look consistent with pulmonary edema however he did spike a fever of 101.8 blood cultures were sent he was placed on broad-spectrum antibiotics, previously he had been on ceftriaxone for possible cellulitis of the arm.  Nephrology was called for urgent dialysis, he was placed on NIPPV and critical care asked to evaluate given progression of his respiratory failure.  Pertinent  Medical History  Depression, anxiety, alcohol consumption.  Significant Hospital Events: Including procedures, antibiotic start and stop dates in addition to other pertinent events   3/8 admitted with rhabdo and AKI started on IV fluids.  Also had acute liver injury this was felt secondary to rhabdo and hypoperfusion.  He had initially been placed on NAC infusion. 3/11 dialysis catheter placed as creatinine continued to climb up over 8, and developed progressive metabolic acidosis received his first round of dialysis on the 11th.   3/12 had second round of dialysis removed 1 L started on ceftriaxone empirically with left arm/elbow/forearm redness, being treated as possible cellulitis 3/13 Low-grade fevers, blood pressure in 140s to 150s systolic, sodium 409, bicarbonate 15, creatinine 10.77 BUN 48, continues to climb anion gap 16, LFTs improving, peaked AST was greater than 70,000, now 142, peaked ALT was 2529 now 666.  Total CK peaked at 32,693, now down to 3392, platelets have remained low, dropping from 129 down to 109-day prior.  Developed new fever, antibiotics widened, chest x-ray consistent with pulmonary edema but started on coverage for possible pneumonia.  Critical care  called after patient being placed on BiPAP and continued marked increased respiratory effort  Interim History / Subjective:  Markedly improved after HD  Objective   Blood pressure (!) 121/92, pulse 96, temperature 98.6 F (37 C), temperature source Oral, resp. rate (!) 30, height 5\' 10"  (1.778 m), weight 73 kg,  SpO2 95%.    Vent Mode: BIPAP FiO2 (%):  [50 %] 50 % PEEP:  [6 cmH20-7 cmH20] 6 cmH20 Pressure Support:  [7 cmH20-8 cmH20] 8 cmH20   Intake/Output Summary (Last 24 hours) at 05/03/2023 1016 Last data filed at 05/03/2023 0600 Gross per 24 hour  Intake 1120.36 ml  Output 4000 ml  Net -2879.64 ml   Filed Weights   05/02/23 0945 05/02/23 1512 05/02/23 1915  Weight: 77.1 kg 77 kg 73 kg    Examination: No distress Heart sounds regular Moves to command RASS 0 Eating breakfast Crackles at bases  Resolved Hospital Problem list     Assessment & Plan:  Acute hypoxemic resp failure- some combination aspiration/CAP + fluid; given rapid improvement suspect latter. Acute renal failure, liver injury 2/2 rhabdo R arm cellulitis- stable/improved on exam today  - Abx to ceftriaxone + doxy x 7 days - Lasix challenge today; otherwise will need HD in next couple days, nephro following - Clinical eval of R arm, looks okay - Wean O2 for sats > 90% - BIPAP PRN WOB  Stable to return to progressive for ongoing care.  Discussed with Dr. Peterson Lombard MD PCCM\

## 2023-05-04 ENCOUNTER — Telehealth: Payer: Self-pay | Admitting: Cardiology

## 2023-05-04 DIAGNOSIS — I340 Nonrheumatic mitral (valve) insufficiency: Secondary | ICD-10-CM

## 2023-05-04 LAB — CBC
HCT: 33.8 % — ABNORMAL LOW (ref 39.0–52.0)
Hemoglobin: 11.5 g/dL — ABNORMAL LOW (ref 13.0–17.0)
MCH: 30.9 pg (ref 26.0–34.0)
MCHC: 34 g/dL (ref 30.0–36.0)
MCV: 90.9 fL (ref 80.0–100.0)
Platelets: 163 10*3/uL (ref 150–400)
RBC: 3.72 MIL/uL — ABNORMAL LOW (ref 4.22–5.81)
RDW: 13.9 % (ref 11.5–15.5)
WBC: 11.7 10*3/uL — ABNORMAL HIGH (ref 4.0–10.5)
nRBC: 0 % (ref 0.0–0.2)

## 2023-05-04 LAB — RENAL FUNCTION PANEL
Albumin: 2.4 g/dL — ABNORMAL LOW (ref 3.5–5.0)
Anion gap: 12 (ref 5–15)
BUN: 31 mg/dL — ABNORMAL HIGH (ref 6–20)
CO2: 26 mmol/L (ref 22–32)
Calcium: 8.2 mg/dL — ABNORMAL LOW (ref 8.9–10.3)
Chloride: 97 mmol/L — ABNORMAL LOW (ref 98–111)
Creatinine, Ser: 10.06 mg/dL — ABNORMAL HIGH (ref 0.61–1.24)
GFR, Estimated: 7 mL/min — ABNORMAL LOW (ref >60)
Glucose, Bld: 99 mg/dL (ref 70–99)
Phosphorus: 2.9 mg/dL (ref 2.5–4.6)
Potassium: 3.4 mmol/L — ABNORMAL LOW (ref 3.5–5.1)
Sodium: 135 mmol/L (ref 135–145)

## 2023-05-04 LAB — HEPATIC FUNCTION PANEL
ALT: 353 U/L — ABNORMAL HIGH (ref 0–44)
AST: 60 U/L — ABNORMAL HIGH (ref 15–41)
Albumin: 2.4 g/dL — ABNORMAL LOW (ref 3.5–5.0)
Alkaline Phosphatase: 57 U/L (ref 38–126)
Bilirubin, Direct: 0.2 mg/dL (ref 0.0–0.2)
Indirect Bilirubin: 0.6 mg/dL (ref 0.3–0.9)
Total Bilirubin: 0.8 mg/dL (ref 0.0–1.2)
Total Protein: 5.9 g/dL — ABNORMAL LOW (ref 6.5–8.1)

## 2023-05-04 LAB — PROCALCITONIN: Procalcitonin: 3.67 ng/mL

## 2023-05-04 MED ORDER — LORAZEPAM 2 MG/ML IJ SOLN: 1.0000 mg | Freq: Once | INTRAMUSCULAR | Status: DC

## 2023-05-04 MED ORDER — CHLORHEXIDINE GLUCONATE CLOTH 2 % EX PADS
6.0000 | MEDICATED_PAD | Freq: Every day | CUTANEOUS | Status: DC
  Administered 2023-05-04 – 2023-05-05 (×2): 6 via TOPICAL

## 2023-05-04 MED ORDER — HYDROXYZINE HCL 25 MG PO TABS
25.0000 mg | ORAL_TABLET | Freq: Four times a day (QID) | ORAL | Status: AC | PRN
  Administered 2023-05-04 – 2023-05-05 (×3): 25 mg via ORAL
  Filled 2023-05-04 (×3): qty 1

## 2023-05-04 MED ORDER — ALTEPLASE 2 MG IJ SOLR: 2.0000 mg | Freq: Once | INTRAMUSCULAR | Status: DC | PRN

## 2023-05-04 MED ORDER — ADULT MULTIVITAMIN W/MINERALS CH
1.0000 | ORAL_TABLET | Freq: Every day | ORAL | Status: DC
  Administered 2023-05-04 – 2023-05-07 (×4): 1 via ORAL
  Filled 2023-05-04 (×3): qty 1

## 2023-05-04 MED ORDER — HEPARIN SODIUM (PORCINE) 1000 UNIT/ML DIALYSIS: 1000.0000 [IU] | INTRAMUSCULAR | Status: DC | PRN

## 2023-05-04 MED ORDER — IPRATROPIUM-ALBUTEROL 0.5-2.5 (3) MG/3ML IN SOLN
3.0000 mL | RESPIRATORY_TRACT | Status: DC | PRN
  Administered 2023-05-05 – 2023-05-08 (×4): 3 mL via RESPIRATORY_TRACT
  Filled 2023-05-04 (×5): qty 3

## 2023-05-04 MED ORDER — CHLORDIAZEPOXIDE HCL 25 MG PO CAPS
25.0000 mg | ORAL_CAPSULE | Freq: Three times a day (TID) | ORAL | Status: AC
  Administered 2023-05-05 (×3): 25 mg via ORAL
  Filled 2023-05-04 (×3): qty 1

## 2023-05-04 MED ORDER — ANTICOAGULANT SODIUM CITRATE 4% (200MG/5ML) IV SOLN: 5.0000 mL | Status: DC | PRN

## 2023-05-04 MED ORDER — CHLORDIAZEPOXIDE HCL 25 MG PO CAPS
25.0000 mg | ORAL_CAPSULE | ORAL | Status: AC
  Administered 2023-05-06 (×2): 25 mg via ORAL
  Filled 2023-05-04 (×3): qty 1

## 2023-05-04 MED ORDER — HEPARIN SODIUM (PORCINE) 1000 UNIT/ML IJ SOLN
INTRAMUSCULAR | Status: AC
Start: 2023-05-04 — End: 2023-05-05
  Filled 2023-05-04: qty 3

## 2023-05-04 MED ORDER — ONDANSETRON 4 MG PO TBDP
4.0000 mg | ORAL_TABLET | Freq: Four times a day (QID) | ORAL | Status: AC | PRN
  Administered 2023-05-05 – 2023-05-06 (×4): 4 mg via ORAL
  Filled 2023-05-04 (×4): qty 1

## 2023-05-04 MED ORDER — THIAMINE HCL 100 MG/ML IJ SOLN
100.0000 mg | Freq: Once | INTRAMUSCULAR | Status: AC
  Administered 2023-05-04: 100 mg via INTRAMUSCULAR
  Filled 2023-05-04: qty 2

## 2023-05-04 MED ORDER — CHLORDIAZEPOXIDE HCL 25 MG PO CAPS
25.0000 mg | ORAL_CAPSULE | Freq: Every day | ORAL | Status: AC
  Administered 2023-05-07: 25 mg via ORAL
  Filled 2023-05-04: qty 1

## 2023-05-04 MED ORDER — CHLORDIAZEPOXIDE HCL 25 MG PO CAPS
25.0000 mg | ORAL_CAPSULE | Freq: Four times a day (QID) | ORAL | Status: AC | PRN
Start: 2023-05-04 — End: 2023-05-07
  Administered 2023-05-05 – 2023-05-06 (×3): 25 mg via ORAL
  Filled 2023-05-04 (×3): qty 1

## 2023-05-04 MED ORDER — CHLORDIAZEPOXIDE HCL 25 MG PO CAPS
25.0000 mg | ORAL_CAPSULE | Freq: Four times a day (QID) | ORAL | Status: AC
  Administered 2023-05-04 (×2): 25 mg via ORAL
  Filled 2023-05-04 (×2): qty 1

## 2023-05-04 MED ORDER — FUROSEMIDE 10 MG/ML IJ SOLN
80.0000 mg | Freq: Two times a day (BID) | INTRAMUSCULAR | Status: DC
  Administered 2023-05-04 – 2023-05-06 (×6): 80 mg via INTRAVENOUS
  Filled 2023-05-04 (×6): qty 8

## 2023-05-04 MED ORDER — LOPERAMIDE HCL 2 MG PO CAPS: 2.0000 mg | ORAL_CAPSULE | ORAL | Status: AC | PRN

## 2023-05-04 NOTE — Progress Notes (Addendum)
 PROGRESS NOTE    Llewellyn Choplin  ZOX:[id number] DOB: August 09, 1995 DOA: 04/27/2023 PCP: Patient, No Pcp Per    Chief Complaint  Patient presents with   Drug / Alcohol Assessment   Emesis    Brief Narrative:  28 year old with history of anxiety and depression, EtOH and polysubstance use was admitted yesterday with nausea and vomiting after 5 days of alcohol binging. Workup revealed rhabdomyolysis and AKI with CK of 26,000 and creatinine 3.2. Patient was also noted to have elevated transaminases with AST greater than 7000 ALT around 5000 with INR of 1.5 and bili of 1.5. Tylenol level was undetectable however he was started on NAC.  -Patient subsequently developed an uric worsening renal function, seen by nephrology and with no improvement was started on HD on 04/30/2023.  Patient also seen by GI who feel likely elevated transaminitis secondary to rhabdomyolysis.   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 -Creatinine went from 4.5>> 8.16>>> 10.77>>>> 9.87>>>> 8.37>>> 7.70>>> 8.92>>> 10.06. -Renal ultrasound done with diffusely increased renal cortical echogenicity in keeping with changes of underlying medical renal disease. -Patient seen in consultation by nephrology and due to worsening renal function HD initiated. -Temporary HD catheter placed  by IR on 04/30/2023 and patient underwent first hemodialysis per nephrology recommendations on 04/30/2023 which he tolerated. -Patient with worsening shortness of breath, tachypnea, concern for volume overload on 05/01/2023 and patient underwent hemodialysis with a liter of fluid removed however patient is still with worsening shortness of breath, tachypneic, increased O2 requirements and looks volume overloaded on examination on 05/02/2023 and underwent emergent hemodialysis. -Patient given IV Lasix challenge on 05/03/2023. -Continue Lasix 80 mg IV every 12 hours per nephrology recommendation. -Patient currently volume overloaded on examination today 05/04/2023 and patient to undergo hemodialysis per nephrology. -Nephrology following and appreciate input and recommendations..  2.  Acute respiratory failure with hypoxia secondary to acute diastolic CHF exacerbation secondary to volume overload from acute renal failure -Likely secondary to volume overload secondary to problem #1. -Patient on 05/02/2023 noted to be in significant volume overload despite hemodialysis on 05/01/2023 with 1 L of fluid removed. -Patient also noted to spike a fever as high as 101.8 on 05/02/2023. -Respiratory viral panel done was negative, influenza A and B PCR negative, COVID-19 PCR negative.  Chest x-ray consistent with volume overload. -ABG obtained with a pH of 7.414, pCO2 of 40, pO2 of 72, bicarb of 25. -Urine Legionella antigen pending, urine pneumococcus antigen pending. -Patient transferred to progressive care unit and placed on the BiPAP with transient clinical improvement, PCCM consulted. -Patient given a Lasix challenge IV. -Patient subsequently underwent emergent hemodialysis on 05/02/2023 with 4 L of fluid removed without clinical improvement. -Patient also placed on broaden antibiotic coverage of vancomycin, cefepime, azithromycin as patient noted to have had a fever. -Patient seen and followed by PCCM on 05/02/2023 and  05/03/2023. -  Antibiotic coverage subsequently narrowed back to IV Rocephin and doxycycline with recommendations to complete a 7-day course. -Patient received IV Lasix challenge on 05/03/2023 however urine output not properly recorded. -Continue BiPAP as needed. -Patient for hemodialysis today. -Nephrology following.  3.  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 -Patient seen in consultation with cardiology who reviewed 2D echo and it was felt MR did not appear severe however was recommending repeat 2D echo versus TEE next week after HD and improvement with fluid overload. -Appreciate cardiology input and recommendations.   4.  Metabolic acidosis secondary to acute renal failure -Was on bicarb drip which has subsequently been discontinued once patient started on hemodialysis.   -Metabolic acidosis resolved with HD.   -Per nephrology.  5.  Hypokalemia/hypophosphatemia -Potassium at 3.4 this morning, phosphorus at 2.9. -Patient currently undergoing hemodialysis during this hospitalization and will defer electrolyte management to nephrology.   6.  Rhabdomyolysis -Felt secondary to alcohol use and laying on the floor. -CK went from 32K>>> 24,888>>14724>>>7243>>> 3392>>> 1205 -Initially placed on IV fluids with some improvement however IV fluids discontinued on 04/30/2023 as patient noted to be volume overloaded and in acute renal failure and subsequently started on hemodialysis. -CK trending down we will continue to follow. -On HD. -Nephrology following.  7.  Alcohol induced hepatitis/abnormal LFTs -Transaminitis felt likely secondary to alcohol induced in the setting of rhabdomyolysis. -Patient seen in consultation by GI, Dr. Adela Lank who feels likely not a primary liver issue and elevated likely secondary to significant rhabdomyolysis versus alcohol abuse. -No further liver workup planned at this time. -NAC discontinued. -LFTs trending  down. -Follow.  8.  Hypocalcemia -Corrected calcium now at 9.5 felt likely secondary to vitamin D deficiency. -Patient with no neuromuscular dysfunction. -EKG without QT prolongation. -Intact PTH noted at 187. -Continue Os-Cal.  9.  Alcohol abuse -Was on Ativan withdrawal protocol which has been completed.. -Was on Librium detox protocol which was currently off.   -Place back on Librium detox protocol.   -Continue thiamine, folic acid, multivitamin. -Alcohol cessation stressed to patient.  10.  Avulsion fracture right ankle -Supportive care, postop shoe per orthopedics.  11.  Vitamin D deficiency -Continue vitamin D 50,000 units weekly. -Will need outpatient follow-up with PCP for repeat vitamin D levels in 3 to 6 months.  12.  Left upper extremity swelling/left upper extremity cellulitis -Patient noted to have some swelling around the left elbow and left forearm with some erythema. -Plain films of the left elbow and left forearm with no acute abnormalities noted. -Clinically improving.   -Was on IV Rocephin and antibiotic coverage broadened due to fevers and worsening respiratory status on 05/02/2023, currently back on IV Rocephin.   -Continue IV Rocephin for presumptive cellulitis.    13.  Anemia -Questionable etiology. -Patient with no overt bleeding. -Anemia panel with iron level of 58, TIBC of 259, folate greater than 40. -Per mother patient with a bout of hematemesis prior to admission and since then. -Hemoglobin currently stable at 11.5. -Patient with history of alcohol abuse, continue PPI twice daily.      DVT prophylaxis: Heparin Code Status: Full Family Communication: Updated patient.  No family at bedside. Disposition: Remain in progressive care floor for now.    Status is: Inpatient Remains inpatient appropriate because: Severity of illness   Consultants:  Gastroenterology: Dr. Adela Lank 04/28/2023 Nephrology: Dr. Ronalee Belts 04/29/2023 PCCM: Dr. Merrily Pew  05/02/2023 Cardiology: Dr. Shirlee Latch 05/03/2023  Procedures:  Plan films of the left forearm  05/01/2023 Chest x-ray 04/27/2023, 04/28/2023, 05/01/2023, 05/02/2023 Plain films of the left elbow 05/01/2023 Right upper quadrant ultrasound 04/27/2023 Renal ultrasound 04/27/2023 Ultrasound of the liver 04/28/2023 Image guided temporary HD catheter placement per IR: Dr. Elby Showers 04/30/2023 2D echo 05/03/2023 Lower extremity Dopplers 05/03/2023  Antimicrobials:  Anti-infectives (From admission, onward)    Start     Dose/Rate Route Frequency Ordered Stop   05/03/23 1115  cefTRIAXone (ROCEPHIN) 2 g in sodium chloride 0.9 % 100 mL IVPB        2 g 200 mL/hr over 30 Minutes Intravenous Every 24 hours 05/03/23 1018 05/06/23 1114   05/03/23 1115  azithromycin (ZITHROMAX) tablet 500 mg  Status:  Discontinued        500 mg Oral Daily 05/03/23 1018 05/03/23 1019   05/03/23 1115  doxycycline (VIBRA-TABS) tablet 100 mg        100 mg Oral Every 12 hours 05/03/23 1019 05/08/23 0959   05/02/23 2200  ceFEPIme (MAXIPIME) 1 g in sodium chloride 0.9 % 100 mL IVPB  Status:  Discontinued        1 g 200 mL/hr over 30 Minutes Intravenous Every 24 hours 05/02/23 1351 05/02/23 1615   05/02/23 2200  vancomycin (VANCOREADY) IVPB 1500 mg/300 mL        1,500 mg 150 mL/hr over 120 Minutes Intravenous  Once 05/02/23 1353 05/03/23 0021   05/02/23 1800  piperacillin-tazobactam (ZOSYN) IVPB 2.25 g  Status:  Discontinued        2.25 g 100 mL/hr over 30 Minutes Intravenous Every 8 hours 05/02/23 1628 05/03/23 1018   05/02/23 1400  azithromycin (ZITHROMAX) 500 mg in sodium chloride 0.9 % 250 mL IVPB  Status:  Discontinued        500 mg 250 mL/hr over 60 Minutes Intravenous Every 24 hours 05/02/23 1318 05/03/23 1018   05/02/23 1351  vancomycin variable dose per unstable renal function (pharmacist dosing)  Status:  Discontinued         Does not apply See admin instructions 05/02/23 1351 05/03/23 1018   05/01/23 1300  cefTRIAXone (ROCEPHIN) 2 g in  sodium chloride 0.9 % 100 mL IVPB  Status:  Discontinued        2 g 200 mL/hr over 30 Minutes Intravenous Every 24 hours 05/01/23 1127 05/02/23 1318         Subjective: Laying in bed with headphones on.  States shortness of breath is better and then it is not better.  Still with some intermittent coughing.  Speaking in full sentences.  Asking what time his dialysis is.  Currently with sats of 98% on room air at rest.  Looks volume overloaded on examination.    Objective: Vitals:   05/04/23 0447 05/04/23 0455 05/04/23 0812 05/04/23 0827  BP:    (!) 143/100  Pulse:  (!) 101    Resp:  20  20  Temp:  98.7 F (37.1 C)  98.6 F (37 C)  TempSrc:  Oral  Oral  SpO2:  98% 98%   Weight: 78 kg     Height:        Intake/Output Summary (Last 24 hours) at 05/04/2023 1016 Last data filed at 05/03/2023 2000 Gross per 24 hour  Intake 641.25 ml  Output --  Net 641.25 ml   Filed Weights   05/02/23 1512 05/02/23 1915 05/04/23 0447  Weight: 77 kg 73 kg 78 kg    Examination:  General exam: Appears calm and comfortable  Respiratory system: Diffuse crackles.  Tachypnea.  Speaking in full sentences.  No use of accessory muscles of respiration.  Cardiovascular system:  Tachycardia. + JVD, murmurs, rubs, gallops or clicks.  2+ bilateral lower extremity edema.  Gastrointestinal system: Abdomen is soft, nontender, nondistended, positive bowel sounds.  No rebound.  No guarding.   Central nervous system: Alert and oriented. No focal neurological deficits. Extremities: LUE with swelling in forearm and around elbow improving.  Decreased erythema left upper extremity.  Less tight.  2+ bilateral lower extremity edema.. Skin: Left upper extremity with decreasing erythema, decreased tenderness to palpation, decreased edema.  Psychiatry: Judgement and insight appear normal. Mood & affect appropriate.     Data Reviewed: I have personally reviewed following labs and imaging studies  CBC: Recent Labs  Lab  04/27/23 1718 04/28/23 0447 04/30/23 0644 05/01/23 0549 05/02/23 0556 05/02/23 0917 05/03/23 0536 05/04/23 0313  WBC 19.5*   < > 9.6 10.0 14.6*  --  13.4* 11.7*  NEUTROABS 17.0*  --   --   --   --   --   --   --   HGB 14.8   < > 11.7* 11.9* 13.5 12.6* 10.5* 11.5*  HCT 46.7   < > 33.2* 33.7* 38.9* 37.0* 30.8* 33.8*  MCV 97.9   < > 87.8 88.2 88.4  --  90.9 90.9  PLT 220   < > 158 129* 109*  --  116* 163   < > = values in this interval not displayed.    Basic Metabolic Panel: Recent Labs  Lab 04/27/23 1824 04/27/23 1824 04/28/23 0447 04/29/23 1610 04/30/23 0644 05/01/23 0549 05/01/23 0550 05/02/23 0555 05/02/23 0556 05/02/23 0917 05/03/23 0536 05/03/23 1645 05/04/23 0314  NA 144  --  140   < > 134*  --  132* 135  --  133* 133* 136 135  K 5.1  --  3.8   < > 3.6  --  3.4* 3.7  --  3.5 3.5 3.6 3.4*  CL 107  --  104   < > 103  --  99 96*  --   --  94* 97* 97*  CO2 17*  --  20*   < > 15*  --  22 24  --   --  26 26 26   GLUCOSE 86  --  123*   < > 88  --  89 86  --   --  109* 129* 99  BUN 24*  --  31*   < > 48*  --  38* 26*  --   --  24* 28* 31*  CREATININE 3.22*  --  4.50*   < > 10.77*  --  9.87* 8.37*  --   --  7.70* 8.92* 10.06*  CALCIUM 6.6*  --  6.4*   < > 7.7*  --  7.7* 7.9*  --   --  7.4* 8.0* 8.2*  MG 2.0  --  1.7  --  1.7 1.7  --   --  2.0  --   --   --   --   PHOS  --    < > 3.3  --  2.3*  --  2.7 1.7*  --   --  3.4  --  2.9   < > = values in this interval not displayed.    GFR: Estimated Creatinine Clearance: 11.6 mL/min (A) (by C-G formula based on SCr of 10.06 mg/dL (H)).  Liver Function Tests: Recent Labs  Lab 04/30/23 9604 05/01/23 0549 05/01/23 0550 05/02/23 0555  05/02/23 0556 05/03/23 0536 05/04/23 0313 05/04/23 0314  AST 695* 297*  --   --  142* 68* 60*  --   ALT 1,269* 937*  --   --  666* 390* 353*  --   ALKPHOS 63 67  --   --  71 59 57  --   BILITOT 2.2* 2.1*  --   --  1.1 1.0 0.8  --   PROT 5.2* 5.3*  --   --  5.7* 5.2* 5.9*  --   ALBUMIN  2.9* 2.7*   < > 2.8* 2.7* 2.2*  2.2* 2.4* 2.4*   < > = values in this interval not displayed.    CBG: Recent Labs  Lab 04/29/23 2151 05/02/23 1434  GLUCAP 85 106*     Recent Results (from the past 240 hours)  Respiratory (~20 pathogens) panel by PCR     Status: None   Collection Time: 05/02/23  8:27 AM   Specimen: Nasopharyngeal Swab; Respiratory  Result Value Ref Range Status   Adenovirus NOT DETECTED NOT DETECTED Final   Coronavirus 229E NOT DETECTED NOT DETECTED Final    Comment: (NOTE) The Coronavirus on the Respiratory Panel, DOES NOT test for the novel  Coronavirus (2019 nCoV)    Coronavirus HKU1 NOT DETECTED NOT DETECTED Final   Coronavirus NL63 NOT DETECTED NOT DETECTED Final   Coronavirus OC43 NOT DETECTED NOT DETECTED Final   Metapneumovirus NOT DETECTED NOT DETECTED Final   Rhinovirus / Enterovirus NOT DETECTED NOT DETECTED Final   Influenza A NOT DETECTED NOT DETECTED Final   Influenza B NOT DETECTED NOT DETECTED Final   Parainfluenza Virus 1 NOT DETECTED NOT DETECTED Final   Parainfluenza Virus 2 NOT DETECTED NOT DETECTED Final   Parainfluenza Virus 3 NOT DETECTED NOT DETECTED Final   Parainfluenza Virus 4 NOT DETECTED NOT DETECTED Final   Respiratory Syncytial Virus NOT DETECTED NOT DETECTED Final   Bordetella pertussis NOT DETECTED NOT DETECTED Final   Bordetella Parapertussis NOT DETECTED NOT DETECTED Final   Chlamydophila pneumoniae NOT DETECTED NOT DETECTED Final   Mycoplasma pneumoniae NOT DETECTED NOT DETECTED Final    Comment: Performed at [facility], [address], [place], [place] [address]  Resp panel by RT-PCR (RSV, Flu A&B, Covid) Anterior Nasal Swab     Status: None   Collection Time: 05/02/23  8:27 AM   Specimen: Anterior Nasal Swab  Result Value Ref Range Status   SARS Coronavirus 2 by RT PCR NEGATIVE NEGATIVE Final   Influenza A by PCR NEGATIVE NEGATIVE Final   Influenza B by PCR NEGATIVE NEGATIVE Final    Comment: (NOTE) The  Xpert Xpress SARS-CoV-2/FLU/RSV plus assay is intended as an aid in the diagnosis of influenza from Nasopharyngeal swab specimens and should not be used as a sole basis for treatment. Nasal washings and aspirates are unacceptable for Xpert Xpress SARS-CoV-2/FLU/RSV testing.  Fact Sheet for Patients: BloggerCourse.com  Fact Sheet for Healthcare Providers: SeriousBroker.it  This test is not yet approved or cleared by the [place] FDA and has been authorized for detection and/or diagnosis of SARS-CoV-2 by FDA under an Emergency Use Authorization (EUA). This EUA will remain in effect (meaning this test can be used) for the duration of the COVID-19 declaration under Section 564(b)(1) of the Act, 21 U.S.C. section 360bbb-3(b)(1), unless the authorization is terminated or revoked.     Resp Syncytial Virus by PCR NEGATIVE NEGATIVE Final    Comment: (NOTE) Fact Sheet for Patients: BloggerCourse.com  Fact  Sheet for Healthcare Providers: SeriousBroker.it  This test is not yet approved or cleared by the [place] and has been authorized for detection and/or diagnosis of SARS-CoV-2 by FDA under an Emergency Use Authorization (EUA). This EUA will remain in effect (meaning this test can be used) for the duration of the COVID-19 declaration under Section 564(b)(1) of the Act, 21 U.S.C. section 360bbb-3(b)(1), unless the authorization is terminated or revoked.  Performed at [facility], [address], [place], [place] [address]   Culture, blood (Routine X 2) w Reflex to ID Panel     Status: None (Preliminary result)   Collection Time: 05/02/23  1:14 PM   Specimen: BLOOD  Result Value Ref Range Status   Specimen Description BLOOD BLOOD LEFT HAND  Final   Special Requests AEROBIC BOTTLE ONLY Blood Culture adequate volume  Final   Culture   Final    NO GROWTH 2  DAYS Performed at [facility], [address] [place], [place] [address]    Report Status PENDING  Incomplete  Culture, blood (Routine X 2) w Reflex to ID Panel     Status: None (Preliminary result)   Collection Time: 05/02/23  1:14 PM   Specimen: BLOOD LEFT ARM  Result Value Ref Range Status   Specimen Description BLOOD LEFT ARM  Final   Special Requests AEROBIC BOTTLE ONLY Blood Culture adequate volume  Final   Culture   Final    NO GROWTH 2 DAYS Performed at [facility], [address], [place], [place] [address]    Report Status PENDING  Incomplete  MRSA Next Gen by PCR, Nasal     Status: None   Collection Time: 05/02/23  2:39 PM   Specimen: Nasal Mucosa; Nasal Swab  Result Value Ref Range Status   MRSA by PCR Next Gen NOT DETECTED NOT DETECTED Final    Comment: (NOTE) The GeneXpert MRSA Assay (FDA approved for NASAL specimens only), is one component of a comprehensive MRSA colonization surveillance program. It is not intended to diagnose MRSA infection nor to guide or monitor treatment for MRSA infections. Test performance is not FDA approved in patients less than 23 years old. Performed at [facility], [address] [place], [place] [address]          Radiology Studies: VAS [place] LOWER EXTREMITY VENOUS (DVT) Result Date: 05/03/2023  Lower Venous DVT Study Patient Name:  JADDEN YIM  Date of Exam:   05/03/2023 Medical Rec #: [id number]        Accession #:    [id number] Date of Birth: [birth year]        Patient Gender: M Patient Age:   27 years Exam Location:  [facility] Procedure:      VAS [place] LOWER EXTREMITY VENOUS (DVT) Referring Phys: Zenia Resides --------------------------------------------------------------------------------  Indications: Edema.  Comparison Study: No prior exam. Performing Technologist: Fernande Bras  Examination Guidelines: A complete evaluation includes B-mode imaging, spectral Doppler, color Doppler, and power  Doppler as needed of all accessible portions of each vessel. Bilateral testing is considered an integral part of a complete examination. Limited examinations for reoccurring indications may be performed as noted. The reflux portion of the exam is performed with the patient in reverse Trendelenburg.  +---------+---------------+---------+-----------+----------+--------------+ RIGHT    CompressibilityPhasicitySpontaneityPropertiesThrombus Aging +---------+---------------+---------+-----------+----------+--------------+ CFV      Full           Yes      Yes                                 +---------+---------------+---------+-----------+----------+--------------+  SFJ      Full           Yes      Yes                                 +---------+---------------+---------+-----------+----------+--------------+ FV Prox  Full                                                        +---------+---------------+---------+-----------+----------+--------------+ FV Mid   Full                                                        +---------+---------------+---------+-----------+----------+--------------+ FV DistalFull                                                        +---------+---------------+---------+-----------+----------+--------------+ PFV      Full                                                        +---------+---------------+---------+-----------+----------+--------------+ POP      Full           Yes      Yes                                 +---------+---------------+---------+-----------+----------+--------------+ PTV      Full                                                        +---------+---------------+---------+-----------+----------+--------------+ PERO     Full                                                        +---------+---------------+---------+-----------+----------+--------------+    +---------+---------------+---------+-----------+----------+--------------+ LEFT     CompressibilityPhasicitySpontaneityPropertiesThrombus Aging +---------+---------------+---------+-----------+----------+--------------+ CFV      Full           Yes      Yes                                 +---------+---------------+---------+-----------+----------+--------------+ SFJ      Full           Yes      Yes                                 +---------+---------------+---------+-----------+----------+--------------+  FV Prox  Full                                                        +---------+---------------+---------+-----------+----------+--------------+ FV Mid   Full                                                        +---------+---------------+---------+-----------+----------+--------------+ FV DistalFull                                                        +---------+---------------+---------+-----------+----------+--------------+ PFV      Full                                                        +---------+---------------+---------+-----------+----------+--------------+ POP      Full           Yes      Yes                                 +---------+---------------+---------+-----------+----------+--------------+ PTV      Full                                                        +---------+---------------+---------+-----------+----------+--------------+ PERO     Full                                                        +---------+---------------+---------+-----------+----------+--------------+     Summary: BILATERAL: - No evidence of deep vein thrombosis seen in the lower extremities, bilaterally. -No evidence of popliteal cyst, bilaterally.   *See table(s) above for measurements and observations.    Preliminary    ECHOCARDIOGRAM COMPLETE Result Date: 05/03/2023    ECHOCARDIOGRAM REPORT   Patient Name:   MAVIN DYKE Date of Exam:  05/03/2023 Medical Rec #:  [id number]       Height:       70.0 in Accession #:    [id number]      Weight:       160.9 lb Date of Birth:  1995/08/07       BSA:          1.903 m Patient Age:    27 years        BP:           128/93 mmHg Patient Gender: M               HR:  97 bpm. Exam Location:  Inpatient Procedure: 2D Echo, Cardiac Doppler and Color Doppler (Both Spectral and Color            Flow Doppler were utilized during procedure). Indications:    I50.40* Unspecified combined systolic (congestive) and diastolic                 (congestive) heart failure  History:        Patient has no prior history of Echocardiogram examinations.                 Signs/Symptoms:Altered Mental Status. ETOH. Substance abuse.  Sonographer:    Sheralyn Boatman RDCS Referring Phys: [id number] SUDHAM CHAND IMPRESSIONS  1. Left ventricular ejection fraction, by estimation, is 55 to 60%. The left ventricle has normal function. The left ventricle has no regional wall motion abnormalities. The left ventricular internal cavity size was mildly dilated. Indeterminate diastolic filling due to E-A fusion. There is the interventricular septum is flattened in systole and diastole, consistent with right ventricular pressure and volume overload.  2. Right ventricular systolic function is normal. The right ventricular size is normal. There is moderately elevated pulmonary artery systolic pressure. The estimated right ventricular systolic pressure is 49.1 mmHg.  3. Left atrial size was moderately dilated.  4. 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 No evidence of mitral stenosis.  5. Tricuspid valve regurgitation is moderate.  6. The aortic valve is grossly normal. Aortic valve regurgitation is not visualized. No aortic stenosis is present. Conclusion(s)/Recommendation(s): The mitral valve is abnormal with severe central MR> Consider TEE to further evalaute. FINDINGS  Left Ventricle: Left ventricular ejection fraction, by  estimation, is 55 to 60%. The left ventricle has normal function. The left ventricle has no regional wall motion abnormalities. The left ventricular internal cavity size was mildly dilated. There is  no left ventricular hypertrophy. The interventricular septum is flattened in systole and diastole, consistent with right ventricular pressure and volume overload. Indeterminate diastolic filling due to E-A fusion. Right Ventricle: The right ventricular size is normal. No increase in right ventricular wall thickness. Right ventricular systolic function is normal. There is moderately elevated pulmonary artery systolic pressure. The tricuspid regurgitant velocity is 2.92 m/s, and with an assumed right atrial pressure of 15 mmHg, the estimated right ventricular systolic pressure is 49.1 mmHg. Left Atrium: Left atrial size was moderately dilated. Right Atrium: Right atrial size was normal in size. Pericardium: There is no evidence of pericardial effusion. 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 No evidence of mitral valve stenosis. MV peak gradient, 8.2 mmHg. The mean mitral valve gradient is 3.0 mmHg. Tricuspid Valve: The tricuspid valve is normal in structure. Tricuspid valve regurgitation is moderate . No evidence of tricuspid stenosis. Aortic Valve: The aortic valve is grossly normal. Aortic valve regurgitation is not visualized. No aortic stenosis is present. Aortic valve peak gradient measures 31.8 mmHg. Pulmonic Valve: The pulmonic valve was normal in structure. Pulmonic valve regurgitation is mild. No evidence of pulmonic stenosis. Aorta: The aortic root and ascending aorta are structurally normal, with no evidence of dilitation. IAS/Shunts: There is right bowing of the interatrial septum, suggestive of elevated left atrial pressure. No atrial level shunt detected by color flow Doppler.  LEFT VENTRICLE PLAX 2D LVIDd:         4.80 cm      Diastology  LVIDs:         3.50 cm  LV e' medial:    9.36 cm/s LV PW:         1.25 cm      LV E/e' medial:  11.6 LV IVS:        1.00 cm      LV e' lateral:   13.80 cm/s LVOT diam:     2.20 cm      LV E/e' lateral: 7.9 LV SV:         75 LV SV Index:   40 LVOT Area:     3.80 cm  LV Volumes (MOD) LV vol d, MOD A2C: 126.0 ml LV vol d, MOD A4C: 96.7 ml LV vol s, MOD A2C: 56.5 ml LV vol s, MOD A4C: 50.3 ml LV SV MOD A2C:     69.5 ml LV SV MOD A4C:     96.7 ml LV SV MOD BP:      59.4 ml RIGHT VENTRICLE             IVC RV S prime:     14.30 cm/s  IVC diam: 2.40 cm TAPSE (M-mode): 1.9 cm LEFT ATRIUM             Index        RIGHT ATRIUM           Index LA diam:        3.60 cm 1.89 cm/m   RA Area:     15.40 cm LA Vol (A2C):   37.7 ml 19.81 ml/m  RA Volume:   37.10 ml  19.49 ml/m LA Vol (A4C):   49.4 ml 25.93 ml/m LA Biplane Vol: 52.1 ml 27.38 ml/m  AORTIC VALVE AV Area (Vmax): 1.59 cm AV Vmax:        282.00 cm/s AV Peak Grad:   31.8 mmHg LVOT Vmax:      118.00 cm/s LVOT Vmean:     80.700 cm/s LVOT VTI:       0.198 m  AORTA Ao Root diam: 2.60 cm Ao Asc diam:  3.60 cm MITRAL VALVE                TRICUSPID VALVE MV Area (PHT): 5.13 cm     TR Peak grad:   34.1 mmHg MV Area VTI:   3.23 cm     TR Vmax:        292.00 cm/s MV Peak grad:  8.2 mmHg MV Mean grad:  3.0 mmHg     SHUNTS MV Vmax:       1.43 m/s     Systemic VTI:  0.20 m MV Vmean:      76.4 cm/s    Systemic Diam: 2.20 cm MV Decel Time: 148 msec MV E velocity: 109.00 cm/s MV A velocity: 68.50 cm/s MV E/A ratio:  1.59 Arvilla Meres MD Electronically signed by Arvilla Meres MD Signature Date/Time: 05/03/2023/10:41:34 AM    Final    DG Chest Port 1 View Result Date: 05/03/2023 CLINICAL DATA:  Pulmonary edema. EXAM: PORTABLE CHEST 1 VIEW COMPARISON:  May 02, 2023. FINDINGS: The heart size and mediastinal contours are within normal limits. Stable bilateral opacities are noted concerning for pneumonia or edema. Right internal jugular catheter is unchanged. The  visualized skeletal structures are unremarkable. IMPRESSION: Stable bilateral lung opacities as noted above. Electronically Signed   By: Lupita Raider M.D.   On: 05/03/2023 10:13        Scheduled Meds:  calcium carbonate  1 tablet Oral BID WC   chlordiazePOXIDE  25 mg Oral QID   Followed by   Melene Muller ON 05/05/2023] chlordiazePOXIDE  25 mg Oral TID   Followed by   Melene Muller ON 05/06/2023] chlordiazePOXIDE  25 mg Oral BH-qamhs   Followed by   Melene Muller ON 05/07/2023] chlordiazePOXIDE  25 mg Oral Daily   Chlorhexidine Gluconate Cloth  6 each Topical Q0600   Chlorhexidine Gluconate Cloth  6 each Topical Q0600   doxycycline  100 mg Oral Q12H   folic acid  1 mg Oral Daily   furosemide  80 mg Intravenous Q12H   heparin  5,000 Units Subcutaneous Q8H   loratadine  10 mg Oral Daily   multivitamin with minerals  1 tablet Oral Daily   pantoprazole  40 mg Oral BID AC   sodium chloride flush  3 mL Intravenous Q12H   thiamine  100 mg Intramuscular Once   thiamine  100 mg Oral Daily   Vitamin D (Ergocalciferol)  50,000 Units Oral Q7 days   Continuous Infusions:  cefTRIAXone (ROCEPHIN)  IV Stopped (05/03/23 1155)     LOS: 7 days    Time spent: 40 minutes    Ramiro Harvest, MD [facility]   To contact the attending provider between 7A-7P or the covering provider during after hours 7P-7A, please log into the web site [email or web address] and access using universal [place] password for that web site. If you do not have the password, please call the hospital operator.  05/04/2023, 10:16 AM

## 2023-05-04 NOTE — Progress Notes (Signed)
 Notified provider of crackles and wheezing in her lungs as well as +2 edema in BLLE.

## 2023-05-04 NOTE — Progress Notes (Signed)
 [facility] NEPHROLOGY PROGRESS NOTE  Assessment/ Plan: Pt is a 28 y.o. yo male  with alcohol abuse presented with rhabdomyolysis, AKI and features of liver injury.   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  Started IV fluid without major urine output and worsening renal labs.   Started HD on 3/12 after placement of temporary HD catheter by IR. HD 3/11 and 3/12-  now seemingly starting to make some urine.  However, the amount of urine is unclear and crt rising off of HD-  I do not want a repeat of what happened earlier in hosp ( transfer to ICU and bipap)  so will do HD today with focus on volume removal-  keep lasix on board then continue to follow daily for HD need.    He does not need renal diet-  only fluid restriction so will change diet    #Metabolic acidosis: Corrected with dialysis.   #Acute transaminitis, seen by GI thought to be due to rhabdomyolysis. Improved    #Alcohol abuse: Continue vitamins and withdrawal protocol.  Supportive care.dont know if he has a psych diagnosis-  almost seems manic   #Hypokalemia: Adjust potassium bath during HD.  Drinking OJ this AM  #Acute febrile, acute hypoxic respiratory failure:  Concerning for infection however respiratory viral panel negative.  UF with dialysis fixed in the past.     Subjective:  transferred to 3E-  UOP not well recorded, given 2 doses of lasix-  was on room air but now on HF [place] weight is supposedly up 5 kg but did change units so different scales.  He has worked himself into a tizzy-  talking about leaving AMA-  is on librium but says he needs something else to help him relax   Objective Vital signs in last 24 hours: Vitals:   05/04/23 0447 05/04/23 0455 05/04/23 0812 05/04/23 0827  BP:    (!) 143/100  Pulse:  (!) 101    Resp:  20  20  Temp:  98.7 F (37.1  C)  98.6 F (37 C)  TempSrc:  Oral  Oral  SpO2:  98% 98%   Weight: 78 kg     Height:       Weight change: 0.9 kg  Intake/Output Summary (Last 24 hours) at 05/04/2023 0829 Last data filed at 05/03/2023 2000 Gross per 24 hour  Intake 650.03 ml  Output --  Net 650.03 ml       Labs: RENAL PANEL Recent Labs  Lab 04/27/23 1824 04/27/23 1824 04/28/23 0447 04/29/23 0644 04/30/23 0644 05/01/23 0549 05/01/23 0550 05/02/23 0555 05/02/23 0556 05/02/23 0917 05/03/23 0536 05/03/23 1645 05/04/23 0313 05/04/23 0314  NA 144  --  140   < > 134*  --  132* 135  --  133* 133* 136  --  135  K 5.1  --  3.8   < > 3.6  --  3.4* 3.7  --  3.5 3.5 3.6  --  3.4*  CL 107  --  104   < > 103  --  99 96*  --   --  94* 97*  --  97*  CO2 17*  --  20*   < > 15*  --  22 24  --   --  26 26  --  26  GLUCOSE 86  --  123*   < > 88  --  89 86  --   --  109* 129*  --  99  BUN 24*  --  31*   < > 48*  --  38* 26*  --   --  24* 28*  --  31*  CREATININE 3.22*  --  4.50*   < > 10.77*  --  9.87* 8.37*  --   --  7.70* 8.92*  --  10.06*  CALCIUM 6.6*  --  6.4*   < > 7.7*  --  7.7* 7.9*  --   --  7.4* 8.0*  --  8.2*  MG 2.0  --  1.7  --  1.7 1.7  --   --  2.0  --   --   --   --   --   PHOS  --    < > 3.3  --  2.3*  --  2.7 1.7*  --   --  3.4  --   --  2.9  ALBUMIN 3.7  --  3.0*   < > 2.9* 2.7* 2.8* 2.8* 2.7*  --  2.2*  2.2*  --  2.4* 2.4*   < > = values in this interval not displayed.    Liver Function Tests: Recent Labs  Lab 05/02/23 0556 05/03/23 0536 05/04/23 0313 05/04/23 0314  AST 142* 68* 60*  --   ALT 666* 390* 353*  --   ALKPHOS 71 59 57  --   BILITOT 1.1 1.0 0.8  --   PROT 5.7* 5.2* 5.9*  --   ALBUMIN 2.7* 2.2*  2.2* 2.4* 2.4*   Recent Labs  Lab 04/27/23 1824  LIPASE 27   No results for input(s): "AMMONIA" in the last 168 hours. CBC: Recent Labs    04/29/23 1200 04/30/23 0644 05/01/23 0549 05/02/23 0556 05/02/23 0917 05/03/23 0536 05/04/23 0313  HGB  --    < > 11.9* 13.5 12.6*  10.5* 11.5*  MCV  --    < > 88.2 88.4  --  90.9 90.9  VITAMINB12 4,105*  --   --   --   --   --   --   FOLATE >40.0  --   --   --   --   --   --   TIBC 259  --   --   --   --   --   --   IRON 58  --   --   --   --   --   --    < > = values in this interval not displayed.    Cardiac Enzymes: Recent Labs  Lab 04/29/23 0644 04/30/23 0644 05/01/23 0549 05/02/23 0556 05/03/23 0536  CKTOTAL 24,888* 14,724* 7,243* 3,392* 1,205*   CBG: Recent Labs  Lab 04/29/23 2151 05/02/23 1434  GLUCAP 85 106*    Iron Studies:  No results for input(s): "IRON", "TIBC", "TRANSFERRIN", "FERRITIN" in the last 72 hours.  Studies/Results: VAS [place] LOWER EXTREMITY VENOUS (DVT) Result Date: 05/03/2023  Lower Venous DVT Study Patient Name:  Andrew Page  Date of Exam:   05/03/2023 Medical Rec #: [id number]        Accession #:    [id number] Date of Birth: 08/30/1995        Patient Gender: M Patient Age:   27 years Exam Location:  [facility] Procedure:      VAS [place] LOWER EXTREMITY VENOUS (DVT) Referring Phys: Zenia Resides --------------------------------------------------------------------------------  Indications: Edema.  Comparison Study: No prior exam. Performing Technologist:  [facility]  Examination Guidelines: A complete evaluation includes B-mode imaging, spectral Doppler, color Doppler, and power Doppler as needed of all accessible portions of each vessel. Bilateral testing is considered an integral part of a complete examination. Limited examinations for reoccurring indications may be performed as noted. The reflux portion of the exam is performed with the patient in reverse Trendelenburg.  +---------+---------------+---------+-----------+----------+--------------+ RIGHT    CompressibilityPhasicitySpontaneityPropertiesThrombus Aging +---------+---------------+---------+-----------+----------+--------------+ CFV      Full           Yes      Yes                                  +---------+---------------+---------+-----------+----------+--------------+ SFJ      Full           Yes      Yes                                 +---------+---------------+---------+-----------+----------+--------------+ FV Prox  Full                                                        +---------+---------------+---------+-----------+----------+--------------+ FV Mid   Full                                                        +---------+---------------+---------+-----------+----------+--------------+ FV DistalFull                                                        +---------+---------------+---------+-----------+----------+--------------+ PFV      Full                                                        +---------+---------------+---------+-----------+----------+--------------+ POP      Full           Yes      Yes                                 +---------+---------------+---------+-----------+----------+--------------+ PTV      Full                                                        +---------+---------------+---------+-----------+----------+--------------+ PERO     Full                                                        +---------+---------------+---------+-----------+----------+--------------+   +---------+---------------+---------+-----------+----------+--------------+  LEFT     CompressibilityPhasicitySpontaneityPropertiesThrombus Aging +---------+---------------+---------+-----------+----------+--------------+ CFV      Full           Yes      Yes                                 +---------+---------------+---------+-----------+----------+--------------+ SFJ      Full           Yes      Yes                                 +---------+---------------+---------+-----------+----------+--------------+ FV Prox  Full                                                         +---------+---------------+---------+-----------+----------+--------------+ FV Mid   Full                                                        +---------+---------------+---------+-----------+----------+--------------+ FV DistalFull                                                        +---------+---------------+---------+-----------+----------+--------------+ PFV      Full                                                        +---------+---------------+---------+-----------+----------+--------------+ POP      Full           Yes      Yes                                 +---------+---------------+---------+-----------+----------+--------------+ PTV      Full                                                        +---------+---------------+---------+-----------+----------+--------------+ PERO     Full                                                        +---------+---------------+---------+-----------+----------+--------------+     Summary: BILATERAL: - No evidence of deep vein thrombosis seen in the lower extremities, bilaterally. -No evidence of popliteal cyst, bilaterally.   *See table(s) above for measurements and observations.    Preliminary    ECHOCARDIOGRAM COMPLETE Result Date: 05/03/2023    ECHOCARDIOGRAM REPORT  Patient Name:   Andrew Page Date of Exam: 05/03/2023 Medical Rec #:  [id number]       Height:       70.0 in Accession #:    [id number]      Weight:       160.9 lb Date of Birth:  07/15/1995       BSA:          1.903 m Patient Age:    27 years        BP:           128/93 mmHg Patient Gender: M               HR:           97 bpm. Exam Location:  Inpatient Procedure: 2D Echo, Cardiac Doppler and Color Doppler (Both Spectral and Color            Flow Doppler were utilized during procedure). Indications:    I50.40* Unspecified combined systolic (congestive) and diastolic                 (congestive) heart failure  History:        Patient has no prior  history of Echocardiogram examinations.                 Signs/Symptoms:Altered Mental Status. ETOH. Substance abuse.  Sonographer:    Sheralyn Boatman RDCS Referring Phys: [id number] SUDHAM CHAND IMPRESSIONS  1. Left ventricular ejection fraction, by estimation, is 55 to 60%. The left ventricle has normal function. The left ventricle has no regional wall motion abnormalities. The left ventricular internal cavity size was mildly dilated. Indeterminate diastolic filling due to E-A fusion. There is the interventricular septum is flattened in systole and diastole, consistent with right ventricular pressure and volume overload.  2. Right ventricular systolic function is normal. The right ventricular size is normal. There is moderately elevated pulmonary artery systolic pressure. The estimated right ventricular systolic pressure is 49.1 mmHg.  3. Left atrial size was moderately dilated.  4. 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 No evidence of mitral stenosis.  5. Tricuspid valve regurgitation is moderate.  6. The aortic valve is grossly normal. Aortic valve regurgitation is not visualized. No aortic stenosis is present. Conclusion(s)/Recommendation(s): The mitral valve is abnormal with severe central MR> Consider TEE to further evalaute. FINDINGS  Left Ventricle: Left ventricular ejection fraction, by estimation, is 55 to 60%. The left ventricle has normal function. The left ventricle has no regional wall motion abnormalities. The left ventricular internal cavity size was mildly dilated. There is  no left ventricular hypertrophy. The interventricular septum is flattened in systole and diastole, consistent with right ventricular pressure and volume overload. Indeterminate diastolic filling due to E-A fusion. Right Ventricle: The right ventricular size is normal. No increase in right ventricular wall thickness. Right ventricular systolic function is normal. There is moderately elevated pulmonary artery  systolic pressure. The tricuspid regurgitant velocity is 2.92 m/s, and with an assumed right atrial pressure of 15 mmHg, the estimated right ventricular systolic pressure is 49.1 mmHg. Left Atrium: Left atrial size was moderately dilated. Right Atrium: Right atrial size was normal in size. Pericardium: There is no evidence of pericardial effusion. 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 No evidence of mitral valve stenosis. MV peak gradient, 8.2 mmHg. The mean mitral valve gradient is 3.0 mmHg. Tricuspid Valve: The tricuspid valve is normal in structure. Tricuspid valve regurgitation  is moderate . No evidence of tricuspid stenosis. Aortic Valve: The aortic valve is grossly normal. Aortic valve regurgitation is not visualized. No aortic stenosis is present. Aortic valve peak gradient measures 31.8 mmHg. Pulmonic Valve: The pulmonic valve was normal in structure. Pulmonic valve regurgitation is mild. No evidence of pulmonic stenosis. Aorta: The aortic root and ascending aorta are structurally normal, with no evidence of dilitation. IAS/Shunts: There is right bowing of the interatrial septum, suggestive of elevated left atrial pressure. No atrial level shunt detected by color flow Doppler.  LEFT VENTRICLE PLAX 2D LVIDd:         4.80 cm      Diastology LVIDs:         3.50 cm      LV e' medial:    9.36 cm/s LV PW:         1.25 cm      LV E/e' medial:  11.6 LV IVS:        1.00 cm      LV e' lateral:   13.80 cm/s LVOT diam:     2.20 cm      LV E/e' lateral: 7.9 LV SV:         75 LV SV Index:   40 LVOT Area:     3.80 cm  LV Volumes (MOD) LV vol d, MOD A2C: 126.0 ml LV vol d, MOD A4C: 96.7 ml LV vol s, MOD A2C: 56.5 ml LV vol s, MOD A4C: 50.3 ml LV SV MOD A2C:     69.5 ml LV SV MOD A4C:     96.7 ml LV SV MOD BP:      59.4 ml RIGHT VENTRICLE             IVC RV S prime:     14.30 cm/s  IVC diam: 2.40 cm TAPSE (M-mode): 1.9 cm LEFT ATRIUM             Index         RIGHT ATRIUM           Index LA diam:        3.60 cm 1.89 cm/m   RA Area:     15.40 cm LA Vol (A2C):   37.7 ml 19.81 ml/m  RA Volume:   37.10 ml  19.49 ml/m LA Vol (A4C):   49.4 ml 25.93 ml/m LA Biplane Vol: 52.1 ml 27.38 ml/m  AORTIC VALVE AV Area (Vmax): 1.59 cm AV Vmax:        282.00 cm/s AV Peak Grad:   31.8 mmHg LVOT Vmax:      118.00 cm/s LVOT Vmean:     80.700 cm/s LVOT VTI:       0.198 m  AORTA Ao Root diam: 2.60 cm Ao Asc diam:  3.60 cm MITRAL VALVE                TRICUSPID VALVE MV Area (PHT): 5.13 cm     TR Peak grad:   34.1 mmHg MV Area VTI:   3.23 cm     TR Vmax:        292.00 cm/s MV Peak grad:  8.2 mmHg MV Mean grad:  3.0 mmHg     SHUNTS MV Vmax:       1.43 m/s     Systemic VTI:  0.20 m MV Vmean:      76.4 cm/s    Systemic Diam: 2.20 cm MV Decel Time: 148 msec MV E velocity: 109.00 cm/s  MV A velocity: 68.50 cm/s MV E/A ratio:  1.59 Arvilla Meres MD Electronically signed by Arvilla Meres MD Signature Date/Time: 05/03/2023/10:41:34 AM    Final    DG Chest Port 1 View Result Date: 05/03/2023 CLINICAL DATA:  Pulmonary edema. EXAM: PORTABLE CHEST 1 VIEW COMPARISON:  May 02, 2023. FINDINGS: The heart size and mediastinal contours are within normal limits. Stable bilateral opacities are noted concerning for pneumonia or edema. Right internal jugular catheter is unchanged. The visualized skeletal structures are unremarkable. IMPRESSION: Stable bilateral lung opacities as noted above. Electronically Signed   By: Lupita Raider M.D.   On: 05/03/2023 10:13   DG CHEST PORT 1 VIEW Result Date: 05/02/2023 CLINICAL DATA:  [id number] Hypoxia [id number] EXAM: PORTABLE CHEST 1 VIEW COMPARISON:  05/01/2023 FINDINGS: Right IJ approach central venous catheter terminating at the level of the right atrium. Heart size is normal. Progressive hazy bilateral perihilar airspace opacities. No pleural effusion or pneumothorax. IMPRESSION: Progressive hazy bilateral perihilar airspace opacities, favoring  pulmonary edema over atypical/viral infection. Electronically Signed   By: Duanne Guess D.O.   On: 05/02/2023 12:43    Medications: Infusions:  cefTRIAXone (ROCEPHIN)  IV Stopped (05/03/23 1155)    Scheduled Medications:  calcium carbonate  1 tablet Oral BID WC   chlordiazePOXIDE  25 mg Oral QID   Followed by   Melene Muller ON 05/05/2023] chlordiazePOXIDE  25 mg Oral TID   Followed by   Melene Muller ON 05/06/2023] chlordiazePOXIDE  25 mg Oral BH-qamhs   Followed by   Melene Muller ON 05/07/2023] chlordiazePOXIDE  25 mg Oral Daily   Chlorhexidine Gluconate Cloth  6 each Topical Q0600   doxycycline  100 mg Oral Q12H   folic acid  1 mg Oral Daily   heparin  5,000 Units Subcutaneous Q8H   ipratropium-albuterol  3 mL Nebulization TID   loratadine  10 mg Oral Daily   multivitamin with minerals  1 tablet Oral Daily   pantoprazole  40 mg Oral BID AC   sodium chloride flush  3 mL Intravenous Q12H   thiamine  100 mg Intramuscular Once   thiamine  100 mg Oral Daily   Vitamin D (Ergocalciferol)  50,000 Units Oral Q7 days    have reviewed scheduled and prn medications.  Physical Exam: General: well appearing male Heart:RRR, s1s2 nl Lungs: Reduced bibasal breath sound-  crackles Abdomen:soft, Non-tender, non-distended Extremities: Lower extremity edema+ Dialysis Access: Temporary HD catheter placed 3/12  Kyara Boxer A Shaine Mount 05/04/2023,8:29 AM  LOS: 7 days

## 2023-05-04 NOTE — Procedures (Signed)
 HD Note:  Some information was entered later than the data was gathered due to patient care needs. The stated time with the data is accurate.  Received patient in bed to unit.   Alert and oriented.   Informed consent signed and in chart.   Access used: right neck temporary internal jugular HD catheter Access issues: None  Patient tolerated treatment well.   TX duration: 3 hours  Alert, without acute distress.  Total UF removed: 3500 ml  Hand-off given to patient's nurse.   Transported back to the room   Malai Lady L. Dareen Piano, RN [facility].

## 2023-05-04 NOTE — Telephone Encounter (Signed)
 Hi can you please reach out to this patient and get him follow-up in the next 2 to 4 weeks with our HF TOC team please.

## 2023-05-04 NOTE — Progress Notes (Signed)
 Rounding Note    Patient Name: Andrew Page Date of Encounter: 05/04/2023  [facility] Cardiologist: None   Subjective   He is wanting to know when dialysis is  Inpatient Medications    Scheduled Meds:  calcium carbonate  1 tablet Oral BID WC   chlordiazePOXIDE  25 mg Oral QID   Followed by   Melene Muller ON 05/05/2023] chlordiazePOXIDE  25 mg Oral TID   Followed by   Melene Muller ON 05/06/2023] chlordiazePOXIDE  25 mg Oral BH-qamhs   Followed by   Melene Muller ON 05/07/2023] chlordiazePOXIDE  25 mg Oral Daily   Chlorhexidine Gluconate Cloth  6 each Topical Q0600   Chlorhexidine Gluconate Cloth  6 each Topical Q0600   doxycycline  100 mg Oral Q12H   folic acid  1 mg Oral Daily   furosemide  80 mg Intravenous Q12H   heparin  5,000 Units Subcutaneous Q8H   loratadine  10 mg Oral Daily   multivitamin with minerals  1 tablet Oral Daily   pantoprazole  40 mg Oral BID AC   sodium chloride flush  3 mL Intravenous Q12H   thiamine  100 mg Intramuscular Once   thiamine  100 mg Oral Daily   Vitamin D (Ergocalciferol)  50,000 Units Oral Q7 days   Continuous Infusions:  cefTRIAXone (ROCEPHIN)  IV Stopped (05/03/23 1155)   PRN Meds: acetaminophen **OR** acetaminophen, calcium carbonate (dosed in mg elemental calcium), camphor-menthol, chlordiazePOXIDE, docusate sodium, feeding supplement (NEPRO CARB STEADY), guaiFENesin, heparin sodium (porcine), hydrOXYzine, ipratropium-albuterol, loperamide, ondansetron, oxyCODONE, prochlorperazine, senna   Vital Signs    Vitals:   05/04/23 0447 05/04/23 0455 05/04/23 0812 05/04/23 0827  BP:    (!) 143/100  Pulse:  (!) 101    Resp:  20  20  Temp:  98.7 F (37.1 C)  98.6 F (37 C)  TempSrc:  Oral  Oral  SpO2:  98% 98%   Weight: 78 kg     Height:        Intake/Output Summary (Last 24 hours) at 05/04/2023 1043 Last data filed at 05/03/2023 2000 Gross per 24 hour  Intake 641.25 ml  Output --  Net 641.25 ml      05/04/2023    4:47 AM  05/02/2023    7:15 PM 05/02/2023    3:12 PM  Last 3 Weights  Weight (lbs) 171 lb 15.3 oz 160 lb 15 oz 169 lb 12.1 oz  Weight (kg) 78 kg 73 kg 77 kg      Telemetry    Sinus rhythm - Personally Reviewed  ECG    No new - Personally Reviewed  Physical Exam   Vitals:   05/04/23 0812 05/04/23 0827  BP:  (!) 143/100  Pulse:    Resp:  20  Temp:  98.6 F (37 C)  SpO2: 98%     GEN: No acute distress.   Cardiac: RRR, no murmurs, rubs, or gallops.  Respiratory: Clear to auscultation bilaterally. GI: Soft, nontender, non-distended  MS: No edema; No deformity. Neuro:  Nonfocal  Psych: Normal affect   Labs    High Sensitivity Troponin:  No results for input(s): "TROPONINIHS" in the last 720 hours.   Chemistry Recent Labs  Lab 04/30/23 0644 05/01/23 0549 05/01/23 0550 05/02/23 0556 05/02/23 0917 05/03/23 0536 05/03/23 1645 05/04/23 0313 05/04/23 0314  NA 134*  --    < >  --    < > 133* 136  --  135  K 3.6  --    < >  --    < >  3.5 3.6  --  3.4*  CL 103  --    < >  --   --  94* 97*  --  97*  CO2 15*  --    < >  --   --  26 26  --  26  GLUCOSE 88  --    < >  --   --  109* 129*  --  99  BUN 48*  --    < >  --   --  24* 28*  --  31*  CREATININE 10.77*  --    < >  --   --  7.70* 8.92*  --  10.06*  CALCIUM 7.7*  --    < >  --   --  7.4* 8.0*  --  8.2*  MG 1.7 1.7  --  2.0  --   --   --   --   --   PROT 5.2* 5.3*  --  5.7*  --  5.2*  --  5.9*  --   ALBUMIN 2.9* 2.7*   < > 2.7*  --  2.2*  2.2*  --  2.4* 2.4*  AST 695* 297*  --  142*  --  68*  --  60*  --   ALT 1,269* 937*  --  666*  --  390*  --  353*  --   ALKPHOS 63 67  --  71  --  59  --  57  --   BILITOT 2.2* 2.1*  --  1.1  --  1.0  --  0.8  --   GFRNONAA 6*  --    < >  --   --  9* 8*  --  7*  ANIONGAP 16*  --    < >  --   --  13 13  --  12   < > = values in this interval not displayed.    Lipids No results for input(s): "CHOL", "TRIG", "HDL", "LABVLDL", "LDLCALC", "CHOLHDL" in the last 168 hours.  Hematology Recent  Labs  Lab 05/02/23 0556 05/02/23 0917 05/03/23 0536 05/04/23 0313  WBC 14.6*  --  13.4* 11.7*  RBC 4.40  --  3.39* 3.72*  HGB 13.5 12.6* 10.5* 11.5*  HCT 38.9* 37.0* 30.8* 33.8*  MCV 88.4  --  90.9 90.9  MCH 30.7  --  31.0 30.9  MCHC 34.7  --  34.1 34.0  RDW 13.2  --  13.8 13.9  PLT 109*  --  116* 163   Thyroid No results for input(s): "TSH", "FREET4" in the last 168 hours.  BNPNo results for input(s): "BNP", "PROBNP" in the last 168 hours.  DDimer No results for input(s): "DDIMER" in the last 168 hours.   Radiology    VAS [place] LOWER EXTREMITY VENOUS (DVT) Result Date: 05/03/2023  Lower Venous DVT Study Patient Name:  Andrew Page  Date of Exam:   05/03/2023 Medical Rec #: [id number]        Accession #:    [id number] Date of Birth: 1996/02/17        Patient Gender: M Patient Age:   27 years Exam Location:  [facility] Procedure:      VAS [place] LOWER EXTREMITY VENOUS (DVT) Referring Phys: Zenia Resides --------------------------------------------------------------------------------  Indications: Edema.  Comparison Study: No prior exam. Performing Technologist: Fernande Bras  Examination Guidelines: A complete evaluation includes B-mode imaging, spectral Doppler, color Doppler, and power Doppler as needed of all accessible portions of  each vessel. Bilateral testing is considered an integral part of a complete examination. Limited examinations for reoccurring indications may be performed as noted. The reflux portion of the exam is performed with the patient in reverse Trendelenburg.  +---------+---------------+---------+-----------+----------+--------------+ RIGHT    CompressibilityPhasicitySpontaneityPropertiesThrombus Aging +---------+---------------+---------+-----------+----------+--------------+ CFV      Full           Yes      Yes                                 +---------+---------------+---------+-----------+----------+--------------+ SFJ      Full           Yes       Yes                                 +---------+---------------+---------+-----------+----------+--------------+ FV Prox  Full                                                        +---------+---------------+---------+-----------+----------+--------------+ FV Mid   Full                                                        +---------+---------------+---------+-----------+----------+--------------+ FV DistalFull                                                        +---------+---------------+---------+-----------+----------+--------------+ PFV      Full                                                        +---------+---------------+---------+-----------+----------+--------------+ POP      Full           Yes      Yes                                 +---------+---------------+---------+-----------+----------+--------------+ PTV      Full                                                        +---------+---------------+---------+-----------+----------+--------------+ PERO     Full                                                        +---------+---------------+---------+-----------+----------+--------------+   +---------+---------------+---------+-----------+----------+--------------+ LEFT     CompressibilityPhasicitySpontaneityPropertiesThrombus Aging +---------+---------------+---------+-----------+----------+--------------+ CFV  Full           Yes      Yes                                 +---------+---------------+---------+-----------+----------+--------------+ SFJ      Full           Yes      Yes                                 +---------+---------------+---------+-----------+----------+--------------+ FV Prox  Full                                                        +---------+---------------+---------+-----------+----------+--------------+ FV Mid   Full                                                         +---------+---------------+---------+-----------+----------+--------------+ FV DistalFull                                                        +---------+---------------+---------+-----------+----------+--------------+ PFV      Full                                                        +---------+---------------+---------+-----------+----------+--------------+ POP      Full           Yes      Yes                                 +---------+---------------+---------+-----------+----------+--------------+ PTV      Full                                                        +---------+---------------+---------+-----------+----------+--------------+ PERO     Full                                                        +---------+---------------+---------+-----------+----------+--------------+     Summary: BILATERAL: - No evidence of deep vein thrombosis seen in the lower extremities, bilaterally. -No evidence of popliteal cyst, bilaterally.   *See table(s) above for measurements and observations.    Preliminary    ECHOCARDIOGRAM COMPLETE Result Date: 05/03/2023    ECHOCARDIOGRAM REPORT   Patient Name:   Andrew Page Date of Exam: 05/03/2023 Medical Rec #:  [id number]       Height:       70.0 in Accession #:    [id number]      Weight:       160.9 lb Date of Birth:  June 15, 1995       BSA:          1.903 m Patient Age:    27 years        BP:           128/93 mmHg Patient Gender: M               HR:           97 bpm. Exam Location:  Inpatient Procedure: 2D Echo, Cardiac Doppler and Color Doppler (Both Spectral and Color            Flow Doppler were utilized during procedure). Indications:    I50.40* Unspecified combined systolic (congestive) and diastolic                 (congestive) heart failure  History:        Patient has no prior history of Echocardiogram examinations.                 Signs/Symptoms:Altered Mental Status. ETOH. Substance abuse.  Sonographer:    Sheralyn Boatman RDCS  Referring Phys: [id number] SUDHAM CHAND IMPRESSIONS  1. Left ventricular ejection fraction, by estimation, is 55 to 60%. The left ventricle has normal function. The left ventricle has no regional wall motion abnormalities. The left ventricular internal cavity size was mildly dilated. Indeterminate diastolic filling due to E-A fusion. There is the interventricular septum is flattened in systole and diastole, consistent with right ventricular pressure and volume overload.  2. Right ventricular systolic function is normal. The right ventricular size is normal. There is moderately elevated pulmonary artery systolic pressure. The estimated right ventricular systolic pressure is 49.1 mmHg.  3. Left atrial size was moderately dilated.  4. 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 No evidence of mitral stenosis.  5. Tricuspid valve regurgitation is moderate.  6. The aortic valve is grossly normal. Aortic valve regurgitation is not visualized. No aortic stenosis is present. Conclusion(s)/Recommendation(s): The mitral valve is abnormal with severe central MR> Consider TEE to further evalaute. FINDINGS  Left Ventricle: Left ventricular ejection fraction, by estimation, is 55 to 60%. The left ventricle has normal function. The left ventricle has no regional wall motion abnormalities. The left ventricular internal cavity size was mildly dilated. There is  no left ventricular hypertrophy. The interventricular septum is flattened in systole and diastole, consistent with right ventricular pressure and volume overload. Indeterminate diastolic filling due to E-A fusion. Right Ventricle: The right ventricular size is normal. No increase in right ventricular wall thickness. Right ventricular systolic function is normal. There is moderately elevated pulmonary artery systolic pressure. The tricuspid regurgitant velocity is 2.92 m/s, and with an assumed right atrial pressure of 15 mmHg, the estimated right  ventricular systolic pressure is 49.1 mmHg. Left Atrium: Left atrial size was moderately dilated. Right Atrium: Right atrial size was normal in size. Pericardium: There is no evidence of pericardial effusion. 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 No evidence of mitral valve stenosis. MV peak gradient, 8.2 mmHg. The mean mitral valve gradient is 3.0 mmHg. Tricuspid Valve: The tricuspid valve is normal in structure. Tricuspid valve regurgitation is moderate . No evidence of tricuspid stenosis. Aortic Valve: The aortic valve is  grossly normal. Aortic valve regurgitation is not visualized. No aortic stenosis is present. Aortic valve peak gradient measures 31.8 mmHg. Pulmonic Valve: The pulmonic valve was normal in structure. Pulmonic valve regurgitation is mild. No evidence of pulmonic stenosis. Aorta: The aortic root and ascending aorta are structurally normal, with no evidence of dilitation. IAS/Shunts: There is right bowing of the interatrial septum, suggestive of elevated left atrial pressure. No atrial level shunt detected by color flow Doppler.  LEFT VENTRICLE PLAX 2D LVIDd:         4.80 cm      Diastology LVIDs:         3.50 cm      LV e' medial:    9.36 cm/s LV PW:         1.25 cm      LV E/e' medial:  11.6 LV IVS:        1.00 cm      LV e' lateral:   13.80 cm/s LVOT diam:     2.20 cm      LV E/e' lateral: 7.9 LV SV:         75 LV SV Index:   40 LVOT Area:     3.80 cm  LV Volumes (MOD) LV vol d, MOD A2C: 126.0 ml LV vol d, MOD A4C: 96.7 ml LV vol s, MOD A2C: 56.5 ml LV vol s, MOD A4C: 50.3 ml LV SV MOD A2C:     69.5 ml LV SV MOD A4C:     96.7 ml LV SV MOD BP:      59.4 ml RIGHT VENTRICLE             IVC RV S prime:     14.30 cm/s  IVC diam: 2.40 cm TAPSE (M-mode): 1.9 cm LEFT ATRIUM             Index        RIGHT ATRIUM           Index LA diam:        3.60 cm 1.89 cm/m   RA Area:     15.40 cm LA Vol (A2C):   37.7 ml 19.81 ml/m  RA  Volume:   37.10 ml  19.49 ml/m LA Vol (A4C):   49.4 ml 25.93 ml/m LA Biplane Vol: 52.1 ml 27.38 ml/m  AORTIC VALVE AV Area (Vmax): 1.59 cm AV Vmax:        282.00 cm/s AV Peak Grad:   31.8 mmHg LVOT Vmax:      118.00 cm/s LVOT Vmean:     80.700 cm/s LVOT VTI:       0.198 m  AORTA Ao Root diam: 2.60 cm Ao Asc diam:  3.60 cm MITRAL VALVE                TRICUSPID VALVE MV Area (PHT): 5.13 cm     TR Peak grad:   34.1 mmHg MV Area VTI:   3.23 cm     TR Vmax:        292.00 cm/s MV Peak grad:  8.2 mmHg MV Mean grad:  3.0 mmHg     SHUNTS MV Vmax:       1.43 m/s     Systemic VTI:  0.20 m MV Vmean:      76.4 cm/s    Systemic Diam: 2.20 cm MV Decel Time: 148 msec MV E velocity: 109.00 cm/s MV A velocity: 68.50 cm/s MV E/A ratio:  1.59 Arvilla Meres MD Electronically  signed by Arvilla Meres MD Signature Date/Time: 05/03/2023/10:41:34 AM    Final    DG Chest Port 1 View Result Date: 05/03/2023 CLINICAL DATA:  Pulmonary edema. EXAM: PORTABLE CHEST 1 VIEW COMPARISON:  May 02, 2023. FINDINGS: The heart size and mediastinal contours are within normal limits. Stable bilateral opacities are noted concerning for pneumonia or edema. Right internal jugular catheter is unchanged. The visualized skeletal structures are unremarkable. IMPRESSION: Stable bilateral lung opacities as noted above. Electronically Signed   By: Lupita Raider M.D.   On: 05/03/2023 10:13    Cardiac Studies  TTE  1. Left ventricular ejection fraction, by estimation, is 55 to 60%. The  left ventricle has normal function. The left ventricle has no regional  wall motion abnormalities. The left ventricular internal cavity size was  mildly dilated. Indeterminate  diastolic filling due to E-A fusion. There is the interventricular septum  is flattened in systole and diastole, consistent with right ventricular  pressure and volume overload.   2. Right ventricular systolic function is normal. The right ventricular  size is normal. There is  moderately elevated pulmonary artery systolic  pressure. The estimated right ventricular systolic pressure is 49.1 mmHg.   3. Left atrial size was moderately dilated.   4. 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 No  evidence of mitral stenosis.   5. Tricuspid valve regurgitation is moderate.   6. The aortic valve is grossly normal. Aortic valve regurgitation is not  visualized. No aortic stenosis is present.   Patient Profile     Andrew Page is seen today for evaluation of acute HFpEF and severe MR at the request of PCCM. 28 y.o. male with history of depression/anxiety, ETOH abuse.  He presented with alcohol binge and rhabdomyolysis with significant acute renal failure with creatinine up to 8.  He was dialyzed on 311 and 312, but began to make urine and renal function started to improve.  Cardiology was consulted due to pulmonary hypertension and moderate to severe MR  Assessment & Plan    Pulmonary hypertension likely group 2 Moderate to severe MR EF 55% Acute renal failure He was felt to be volume overloaded and IVC was dilated on echo.  He was given IV Lasix 80 mg IV twice daily. GFR 7 renal fxn is worsening; his acute renal failure is in the setting of rhabdo.  He may need more dialysis.  Will defer to nephrology to manage UF.  This will help with his elevated filling pressures.  Can reassess once he has completed his course during this admission and follow him up as an outpatient.  Cardiology will sign off   For questions or updates, please contact [facility] Please consult [email or web address] for contact info under        Signed, Maisie Fus, MD  05/04/2023, 10:43 AM

## 2023-05-04 NOTE — Plan of Care (Signed)
 Pt does not appear to be coping well as evidenced by pt asking for anxiety meds and asking to go back to ICU. Pt appears to be afraid and needing supportive listening

## 2023-05-05 LAB — CBC
HCT: 32.9 % — ABNORMAL LOW (ref 39.0–52.0)
Hemoglobin: 11.1 g/dL — ABNORMAL LOW (ref 13.0–17.0)
MCH: 30.6 pg (ref 26.0–34.0)
MCHC: 33.7 g/dL (ref 30.0–36.0)
MCV: 90.6 fL (ref 80.0–100.0)
Platelets: 217 10*3/uL (ref 150–400)
RBC: 3.63 MIL/uL — ABNORMAL LOW (ref 4.22–5.81)
RDW: 13.7 % (ref 11.5–15.5)
WBC: 9.9 10*3/uL (ref 4.0–10.5)
nRBC: 0 % (ref 0.0–0.2)

## 2023-05-05 LAB — RENAL FUNCTION PANEL
Albumin: 2.6 g/dL — ABNORMAL LOW (ref 3.5–5.0)
Anion gap: 13 (ref 5–15)
BUN: 22 mg/dL — ABNORMAL HIGH (ref 6–20)
CO2: 28 mmol/L (ref 22–32)
Calcium: 8.5 mg/dL — ABNORMAL LOW (ref 8.9–10.3)
Chloride: 98 mmol/L (ref 98–111)
Creatinine, Ser: 8.46 mg/dL — ABNORMAL HIGH (ref 0.61–1.24)
GFR, Estimated: 8 mL/min — ABNORMAL LOW (ref >60)
Glucose, Bld: 92 mg/dL (ref 70–99)
Phosphorus: 2.7 mg/dL (ref 2.5–4.6)
Potassium: 3.5 mmol/L (ref 3.5–5.1)
Sodium: 139 mmol/L (ref 135–145)

## 2023-05-05 LAB — MAGNESIUM: Magnesium: 1.9 mg/dL (ref 1.7–2.4)

## 2023-05-05 LAB — PROCALCITONIN: Procalcitonin: 2.21 ng/mL

## 2023-05-05 NOTE — Progress Notes (Signed)
 [facility] NEPHROLOGY PROGRESS NOTE  Assessment/ Plan: Pt is a 28 y.o. yo male  with alcohol abuse presented with rhabdomyolysis, AKI and features of liver injury.   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  Started IV fluid without major urine output and worsening renal labs.   Started HD on 3/12  temporary HD catheter by IR. HD 3/11 and 3/12-  then seemingly starting to make some urine.  However, the amount of urine is unclear and crt rising off of HD-  needed another HD on 3/15 mostly for inc o2 req.   Now will keep lasix on board then continue to follow daily for HD need.    He does not need renal diet-  only fluid restriction so will change diet  #Metabolic acidosis: Corrected with dialysis.   #Acute transaminitis, seen by GI thought to be due to rhabdomyolysis. Improved    #Alcohol abuse: Continue vitamins and withdrawal protocol.  Supportive care.dont know if he has a psych diagnosis-  almost seems manic   #Hypokalemia: Adjust potassium bath during HD. On reg diet in terms of potassium   #Acute febrile, acute hypoxic respiratory failure:  Concerning for infection however respiratory viral panel negative.  UF with dialysis fixed in the past.     Subjective:  670 UOP recorded, on lasix 80 q 12-   back  on room air after HD and 3.5 liters removed.  Labs reflective of HD -  he is happy today   Objective Vital signs in last 24 hours: Vitals:   05/04/23 2357 05/05/23 0215 05/05/23 0629 05/05/23 0833  BP: (!) 134/90  (!) 136/99 (!) (P) 138/95  Pulse: (!) 106 94 96 96  Resp: 20 20 20    Temp: 100.1 F (37.8 C)  98.6 F (37 C) (P) 98.3 F (36.8 C)  TempSrc: Oral  Oral (P) Oral  SpO2: 93% 94% 96% 94%  Weight:   72 kg   Height:       Weight change: -6 kg  Intake/Output Summary (Last 24 hours) at 05/05/2023 0835 Last data  filed at 05/05/2023 0700 Gross per 24 hour  Intake 240 ml  Output 4170 ml  Net -3930 ml       Labs: RENAL PANEL Recent Labs  Lab 04/30/23 0644 05/01/23 0549 05/01/23 0550 05/02/23 0555 05/02/23 0556 05/02/23 0917 05/03/23 0536 05/03/23 1645 05/04/23 0313 05/04/23 0314 05/05/23 0239  NA 134*  --  132* 135  --  133* 133* 136  --  135 139  K 3.6  --  3.4* 3.7  --  3.5 3.5 3.6  --  3.4* 3.5  CL 103  --  99 96*  --   --  94* 97*  --  97* 98  CO2 15*  --  22 24  --   --  26 26  --  26 28  GLUCOSE 88  --  89 86  --   --  109* 129*  --  99 92  BUN 48*  --  38* 26*  --   --  24* 28*  --  31* 22*  CREATININE 10.77*  --  9.87* 8.37*  --   --  7.70* 8.92*  --  10.06* 8.46*  CALCIUM 7.7*  --  7.7* 7.9*  --   --  7.4* 8.0*  --  8.2* 8.5*  MG 1.7 1.7  --   --  2.0  --   --   --   --   --  1.9  PHOS 2.3*  --  2.7 1.7*  --   --  3.4  --   --  2.9 2.7  ALBUMIN 2.9* 2.7* 2.8* 2.8* 2.7*  --  2.2*  2.2*  --  2.4* 2.4* 2.6*    Liver Function Tests: Recent Labs  Lab 05/02/23 0556 05/03/23 0536 05/04/23 0313 05/04/23 0314 05/05/23 0239  AST 142* 68* 60*  --   --   ALT 666* 390* 353*  --   --   ALKPHOS 71 59 57  --   --   BILITOT 1.1 1.0 0.8  --   --   PROT 5.7* 5.2* 5.9*  --   --   ALBUMIN 2.7* 2.2*  2.2* 2.4* 2.4* 2.6*   No results for input(s): "LIPASE", "AMYLASE" in the last 168 hours.  No results for input(s): "AMMONIA" in the last 168 hours. CBC: Recent Labs    04/29/23 1200 04/30/23 0644 05/02/23 0556 05/02/23 0917 05/03/23 0536 05/04/23 0313 05/05/23 0239  HGB  --    < > 13.5 12.6* 10.5* 11.5* 11.1*  MCV  --    < > 88.4  --  90.9 90.9 90.6  VITAMINB12 4,105*  --   --   --   --   --   --   FOLATE >40.0  --   --   --   --   --   --   TIBC 259  --   --   --   --   --   --   IRON 58  --   --   --   --   --   --    < > = values in this interval not displayed.    Cardiac Enzymes: Recent Labs  Lab 04/29/23 0644 04/30/23 0644 05/01/23 0549 05/02/23 0556  05/03/23 0536  CKTOTAL 24,888* 14,724* 7,243* 3,392* 1,205*   CBG: Recent Labs  Lab 04/29/23 2151 05/02/23 1434  GLUCAP 85 106*    Iron Studies:  No results for input(s): "IRON", "TIBC", "TRANSFERRIN", "FERRITIN" in the last 72 hours.  Studies/Results: VAS [place] LOWER EXTREMITY VENOUS (DVT) Result Date: 05/04/2023  Lower Venous DVT Study Patient Name:  [facility]  Date of Exam:   05/03/2023 Medical Rec #: [id number]        Accession #:    [id number] Date of Birth: 07/03/1995        Patient Gender: M Patient Age:   27 years Exam Location:  [facility] Procedure:      VAS [place] LOWER EXTREMITY VENOUS (DVT) Referring Phys: Zenia Resides --------------------------------------------------------------------------------  Indications: Edema.  Comparison Study: No prior exam. Performing Technologist: Fernande Bras  Examination Guidelines: A complete evaluation includes B-mode imaging, spectral Doppler, color Doppler, and power Doppler as needed of all accessible portions of each vessel. Bilateral testing is considered an integral part of a complete examination. Limited examinations for reoccurring indications may be performed as noted. The reflux portion of the exam is performed with the patient in reverse Trendelenburg.  +---------+---------------+---------+-----------+----------+--------------+ RIGHT    CompressibilityPhasicitySpontaneityPropertiesThrombus Aging +---------+---------------+---------+-----------+----------+--------------+ CFV      Full           Yes      Yes                                 +---------+---------------+---------+-----------+----------+--------------+  SFJ      Full           Yes      Yes                                 +---------+---------------+---------+-----------+----------+--------------+ FV Prox  Full                                                        +---------+---------------+---------+-----------+----------+--------------+ FV  Mid   Full                                                        +---------+---------------+---------+-----------+----------+--------------+ FV DistalFull                                                        +---------+---------------+---------+-----------+----------+--------------+ PFV      Full                                                        +---------+---------------+---------+-----------+----------+--------------+ POP      Full           Yes      Yes                                 +---------+---------------+---------+-----------+----------+--------------+ PTV      Full                                                        +---------+---------------+---------+-----------+----------+--------------+ PERO     Full                                                        +---------+---------------+---------+-----------+----------+--------------+   +---------+---------------+---------+-----------+----------+--------------+ LEFT     CompressibilityPhasicitySpontaneityPropertiesThrombus Aging +---------+---------------+---------+-----------+----------+--------------+ CFV      Full           Yes      Yes                                 +---------+---------------+---------+-----------+----------+--------------+ SFJ      Full           Yes      Yes                                 +---------+---------------+---------+-----------+----------+--------------+  FV Prox  Full                                                        +---------+---------------+---------+-----------+----------+--------------+ FV Mid   Full                                                        +---------+---------------+---------+-----------+----------+--------------+ FV DistalFull                                                        +---------+---------------+---------+-----------+----------+--------------+ PFV      Full                                                         +---------+---------------+---------+-----------+----------+--------------+ POP      Full           Yes      Yes                                 +---------+---------------+---------+-----------+----------+--------------+ PTV      Full                                                        +---------+---------------+---------+-----------+----------+--------------+ PERO     Full                                                        +---------+---------------+---------+-----------+----------+--------------+     Summary: BILATERAL: - No evidence of deep vein thrombosis seen in the lower extremities, bilaterally. -No evidence of popliteal cyst, bilaterally.   *See table(s) above for measurements and observations. Electronically signed by Heath Lark on 05/04/2023 at 2:22:47 PM.    Final    ECHOCARDIOGRAM COMPLETE Result Date: 05/03/2023    ECHOCARDIOGRAM REPORT   Patient Name:   [facility] Date of Exam: 05/03/2023 Medical Rec #:  [id number]       Height:       70.0 in Accession #:    [id number]      Weight:       160.9 lb Date of Birth:  [birth year]       BSA:          1.903 m Patient Age:    27 years        BP:           128/93 mmHg Patient Gender: M  HR:           97 bpm. Exam Location:  Inpatient Procedure: 2D Echo, Cardiac Doppler and Color Doppler (Both Spectral and Color            Flow Doppler were utilized during procedure). Indications:    I50.40* Unspecified combined systolic (congestive) and diastolic                 (congestive) heart failure  History:        Patient has no prior history of Echocardiogram examinations.                 Signs/Symptoms:Altered Mental Status. ETOH. Substance abuse.  Sonographer:    Sheralyn Boatman RDCS Referring Phys: [id number] SUDHAM CHAND IMPRESSIONS  1. Left ventricular ejection fraction, by estimation, is 55 to 60%. The left ventricle has normal function. The left ventricle has no regional wall motion abnormalities. The  left ventricular internal cavity size was mildly dilated. Indeterminate diastolic filling due to E-A fusion. There is the interventricular septum is flattened in systole and diastole, consistent with right ventricular pressure and volume overload.  2. Right ventricular systolic function is normal. The right ventricular size is normal. There is moderately elevated pulmonary artery systolic pressure. The estimated right ventricular systolic pressure is 49.1 mmHg.  3. Left atrial size was moderately dilated.  4. 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 No evidence of mitral stenosis.  5. Tricuspid valve regurgitation is moderate.  6. The aortic valve is grossly normal. Aortic valve regurgitation is not visualized. No aortic stenosis is present. Conclusion(s)/Recommendation(s): The mitral valve is abnormal with severe central MR> Consider TEE to further evalaute. FINDINGS  Left Ventricle: Left ventricular ejection fraction, by estimation, is 55 to 60%. The left ventricle has normal function. The left ventricle has no regional wall motion abnormalities. The left ventricular internal cavity size was mildly dilated. There is  no left ventricular hypertrophy. The interventricular septum is flattened in systole and diastole, consistent with right ventricular pressure and volume overload. Indeterminate diastolic filling due to E-A fusion. Right Ventricle: The right ventricular size is normal. No increase in right ventricular wall thickness. Right ventricular systolic function is normal. There is moderately elevated pulmonary artery systolic pressure. The tricuspid regurgitant velocity is 2.92 m/s, and with an assumed right atrial pressure of 15 mmHg, the estimated right ventricular systolic pressure is 49.1 mmHg. Left Atrium: Left atrial size was moderately dilated. Right Atrium: Right atrial size was normal in size. Pericardium: There is no evidence of pericardial effusion. Mitral Valve: The mitral  valve is myxomatous. Severe mitral valve regurgitation, with centrally-directed jet. No evidence of mitral valve stenosis. MV peak gradient, 8.2 mmHg. The mean mitral valve gradient is 3.0 mmHg. Tricuspid Valve: The tricuspid valve is normal in structure. Tricuspid valve regurgitation is moderate . No evidence of tricuspid stenosis. Aortic Valve: The aortic valve is grossly normal. Aortic valve regurgitation is not visualized. No aortic stenosis is present. Aortic valve peak gradient measures 31.8 mmHg. Pulmonic Valve: The pulmonic valve was normal in structure. Pulmonic valve regurgitation is mild. No evidence of pulmonic stenosis. Aorta: The aortic root and ascending aorta are structurally normal, with no evidence of dilitation. IAS/Shunts: There is right bowing of the interatrial septum, suggestive of elevated left atrial pressure. No atrial level shunt detected by color flow Doppler.  LEFT VENTRICLE PLAX 2D LVIDd:         4.80 cm      Diastology LVIDs:  3.50 cm      LV e' medial:    9.36 cm/s LV PW:         1.25 cm      LV E/e' medial:  11.6 LV IVS:        1.00 cm      LV e' lateral:   13.80 cm/s LVOT diam:     2.20 cm      LV E/e' lateral: 7.9 LV SV:         75 LV SV Index:   40 LVOT Area:     3.80 cm  LV Volumes (MOD) LV vol d, MOD A2C: 126.0 ml LV vol d, MOD A4C: 96.7 ml LV vol s, MOD A2C: 56.5 ml LV vol s, MOD A4C: 50.3 ml LV SV MOD A2C:     69.5 ml LV SV MOD A4C:     96.7 ml LV SV MOD BP:      59.4 ml RIGHT VENTRICLE             IVC RV S prime:     14.30 cm/s  IVC diam: 2.40 cm TAPSE (M-mode): 1.9 cm LEFT ATRIUM             Index        RIGHT ATRIUM           Index LA diam:        3.60 cm 1.89 cm/m   RA Area:     15.40 cm LA Vol (A2C):   37.7 ml 19.81 ml/m  RA Volume:   37.10 ml  19.49 ml/m LA Vol (A4C):   49.4 ml 25.93 ml/m LA Biplane Vol: 52.1 ml 27.38 ml/m  AORTIC VALVE AV Area (Vmax): 1.59 cm AV Vmax:        282.00 cm/s AV Peak Grad:   31.8 mmHg LVOT Vmax:      118.00 cm/s LVOT Vmean:      80.700 cm/s LVOT VTI:       0.198 m  AORTA Ao Root diam: 2.60 cm Ao Asc diam:  3.60 cm MITRAL VALVE                TRICUSPID VALVE MV Area (PHT): 5.13 cm     TR Peak grad:   34.1 mmHg MV Area VTI:   3.23 cm     TR Vmax:        292.00 cm/s MV Peak grad:  8.2 mmHg MV Mean grad:  3.0 mmHg     SHUNTS MV Vmax:       1.43 m/s     Systemic VTI:  0.20 m MV Vmean:      76.4 cm/s    Systemic Diam: 2.20 cm MV Decel Time: 148 msec MV E velocity: 109.00 cm/s MV A velocity: 68.50 cm/s MV E/A ratio:  1.59 Arvilla Meres MD Electronically signed by Arvilla Meres MD Signature Date/Time: 05/03/2023/10:41:34 AM    Final     Medications: Infusions:  cefTRIAXone (ROCEPHIN)  IV 2 g (05/04/23 1215)    Scheduled Medications:  calcium carbonate  1 tablet Oral BID WC   chlordiazePOXIDE  25 mg Oral QID   Followed by   chlordiazePOXIDE  25 mg Oral TID   Followed by   Melene Muller ON 05/06/2023] chlordiazePOXIDE  25 mg Oral BH-qamhs   Followed by   Melene Muller ON 05/07/2023] chlordiazePOXIDE  25 mg Oral Daily   Chlorhexidine Gluconate Cloth  6 each Topical Q0600   Chlorhexidine Gluconate Cloth  6  each Topical Q0600   doxycycline  100 mg Oral Q12H   folic acid  1 mg Oral Daily   furosemide  80 mg Intravenous Q12H   heparin  5,000 Units Subcutaneous Q8H   loratadine  10 mg Oral Daily   multivitamin with minerals  1 tablet Oral Daily   pantoprazole  40 mg Oral BID AC   sodium chloride flush  3 mL Intravenous Q12H   thiamine  100 mg Oral Daily   Vitamin D (Ergocalciferol)  50,000 Units Oral Q7 days    have reviewed scheduled and prn medications.  Physical Exam: General: well appearing male Heart:RRR, s1s2 nl Lungs: much clearer Abdomen:soft, Non-tender, non-distended Extremities: less edema  Dialysis Access: Temporary HD catheter placed 3/12  Sherrilynn Gudgel A Dawne Casali 05/05/2023,8:35 AM  LOS: 8 days

## 2023-05-05 NOTE — Progress Notes (Signed)
   05/05/23 0215  BiPAP/CPAP/SIPAP  Reason BIPAP/CPAP not in use Non-compliant  BiPAP/CPAP /SiPAP Vitals  Pulse Rate 94  Resp 20  SpO2 94 %  Bilateral Breath Sounds Clear;Diminished  MEWS Score/Color  MEWS Score 0  MEWS Score Color [place] Si

## 2023-05-05 NOTE — Progress Notes (Signed)
 PROGRESS NOTE    Andrew Page  NGE:[id number] DOB: 25-Apr-1995 DOA: 04/27/2023 PCP: Patient, No Pcp Per    Chief Complaint  Patient presents with   Drug / Alcohol Assessment   Emesis    Brief Narrative:  28 year old with history of anxiety and depression, EtOH and polysubstance use was admitted yesterday with nausea and vomiting after 5 days of alcohol binging. Workup revealed rhabdomyolysis and AKI with CK of 26,000 and creatinine 3.2. Patient was also noted to have elevated transaminases with AST greater than 7000 ALT around 5000 with INR of 1.5 and bili of 1.5. Tylenol level was undetectable however he was started on NAC.  -Patient subsequently developed an uric worsening renal function, seen by nephrology and with no improvement was started on HD on 04/30/2023.  Patient also seen by GI who feel likely elevated transaminitis secondary to rhabdomyolysis.   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 -Creatinine went from 4.5>> 8.16>>> 10.77>>>> 9.87>>>> 8.37>>> 7.70>>> 8.92>>> 10.06>>> 8.46. -Renal ultrasound done with diffusely increased renal cortical echogenicity in keeping with changes of underlying medical renal disease. -Patient seen in consultation by nephrology and due to worsening renal function HD initiated. -Temporary HD  catheter placed by IR on 04/30/2023 and patient underwent first hemodialysis per nephrology recommendations on 04/30/2023 which he tolerated. -Patient with worsening shortness of breath, tachypnea, concern for volume overload on 05/01/2023 and patient underwent hemodialysis with a liter of fluid removed however patient is still with worsening shortness of breath, tachypneic, increased O2 requirements and looks volume overloaded on examination on 05/02/2023 and underwent emergent hemodialysis. -Patient given IV Lasix challenge on 05/03/2023, 05/04/2023. -Continue Lasix 80 mg IV every 12 hours per nephrology recommendation. -Patient underwent hemodialysis on 05/04/2023 with 3.5 L of fluid removed. -Continue Lasix daily for now and assess daily for HD needs per nephrology recommendations. -Nephrology following and appreciate input and recommendations..  2.  Acute respiratory failure with hypoxia secondary to acute diastolic CHF exacerbation secondary to volume overload from acute renal failure -Likely secondary to volume overload secondary to problem #1. -Patient on 05/02/2023 noted to be in significant volume overload despite hemodialysis on 05/01/2023 with 1 L of fluid removed. -Patient also noted to spike a fever as high as 101.8 on 05/02/2023. -Respiratory viral panel done was negative, influenza A and B PCR negative, COVID-19 PCR negative.  Chest x-ray consistent with volume overload. -ABG obtained with a pH of 7.414, pCO2 of 40, pO2 of 72, bicarb of 25. -Urine Legionella antigen pending, urine pneumococcus antigen pending. -Patient transferred to progressive care unit and placed on the BiPAP with transient clinical improvement, PCCM consulted. -Patient given a Lasix challenge IV. -Patient subsequently underwent emergent hemodialysis on 05/02/2023 with 4 L of fluid removed without clinical improvement. -Patient also placed on broaden antibiotic coverage of vancomycin, cefepime, azithromycin as patient noted  to have had  a fever. -Patient seen and followed by PCCM on 05/02/2023 and 05/03/2023. -Antibiotic coverage subsequently narrowed back to IV Rocephin and doxycycline with recommendations to complete a 7-day course. -Patient received IV Lasix challenge on 05/03/2023 as well as 05/04/2023.  -Urine output of 670 cc recorded over the past 24 hours.  -Patient underwent hemodialysis on 05/04/2023 with 3.5 L of fluid removed.  -Clinical improvement.  -Currently on Lasix 80 mg IV every 12 hours per nephrology recommendations.  -Per nephrology continue Lasix and continue to follow daily for HD needs.  -Continue BiPAP as needed. -Nephrology following.  3.  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 -Patient seen in consultation with cardiology who reviewed 2D echo and it was felt MR did not appear severe however was recommending repeat 2D echo versus TEE next week after HD and improvement with fluid overload. -Cardiology recommending outpatient follow-up. -Cardiology was following but has signed off as of 05/04/2023.   4.  Metabolic acidosis secondary to acute renal failure -Was on bicarb drip which has subsequently been discontinued once patient started on hemodialysis.   -Metabolic acidosis resolved with HD.   -Per nephrology.  5.  Hypokalemia/hypophosphatemia -Potassium at 3.5 this morning, phosphorus at 2.7. -Patient currently undergoing hemodialysis during this hospitalization and will defer electrolyte management to nephrology.   6.  Rhabdomyolysis -Felt secondary to alcohol use and laying on the floor. -CK went from 32K>>> 24,888>>14724>>>7243>>> 3392>>> 1205 -Initially placed on IV fluids with some improvement however IV fluids discontinued on 04/30/2023 as patient noted to be volume overloaded and in acute renal failure and subsequently started on hemodialysis. -CK trending down we will continue to follow. -On HD. -Nephrology following.  7.  Alcohol induced  hepatitis/abnormal LFTs -Transaminitis felt likely secondary to alcohol induced in the setting of rhabdomyolysis. -Patient seen in consultation by GI, Dr. Adela Lank who feels likely not a primary liver issue and elevated likely secondary to significant rhabdomyolysis versus alcohol abuse. -No further liver workup planned at this time. -NAC discontinued. -LFTs trending down. -Follow.  8.  Hypocalcemia -Corrected calcium now at 9.6 felt likely secondary to vitamin D deficiency. -Patient with no neuromuscular dysfunction. -EKG without QT prolongation. -Intact PTH noted at 187. -Continue Os-Cal.  9.  Alcohol abuse -Was on Ativan withdrawal protocol which has been completed.. -Was on Librium detox protocol which was currently off.   -Placed back on Librium detox protocol on 05/04/2023.   -Continue thiamine, folic acid, multivitamin. -Alcohol cessation stressed to patient.  10.  Avulsion fracture right ankle -Supportive care, postop shoe per orthopedics.  11.  Vitamin D deficiency -Continue vitamin D 50,000 units weekly. -Will need outpatient follow-up with PCP for repeat vitamin D levels in 3 to 6 months.  12.  Left upper extremity swelling/left upper extremity cellulitis -Patient noted to have some swelling around the left elbow and left forearm with some erythema. -Plain films of the left elbow and left forearm with no acute abnormalities noted. -Improved clinically.  -Was on IV Rocephin and antibiotic coverage broadened due to fevers and worsening respiratory status on 05/02/2023, currently back on IV Rocephin.   -Continue IV Rocephin for presumptive cellulitis.    13.  Anemia -Questionable etiology. -Patient with no overt bleeding. -Anemia panel with iron level of 58, TIBC of 259, folate greater than 40. -Per mother patient with a bout of hematemesis prior to admission and since then. -Hemoglobin currently stable at 11.1. -Patient with history of alcohol abuse, continue PPI  twice daily.  DVT prophylaxis: Heparin Code Status: Full Family Communication: Updated patient.  Updated mother at bedside.  Disposition: Remain in progressive care floor for now.    Status is: Inpatient Remains inpatient appropriate because: Severity of illness   Consultants:  Gastroenterology: Dr. Adela Lank 04/28/2023 Nephrology: Dr. Ronalee Belts 04/29/2023 PCCM: Dr. Merrily Pew 05/02/2023 Cardiology: Dr. Shirlee Latch 05/03/2023  Procedures:  Plan films of the left forearm 05/01/2023 Chest x-ray 04/27/2023, 04/28/2023, 05/01/2023, 05/02/2023 Plain films of the left elbow 05/01/2023 Right upper quadrant ultrasound 04/27/2023 Renal ultrasound 04/27/2023 Ultrasound of the liver 04/28/2023 Image guided temporary HD catheter placement per IR: Dr. Elby Showers 04/30/2023 2D echo 05/03/2023 Lower extremity Dopplers 05/03/2023  Antimicrobials:  Anti-infectives (From admission, onward)    Start     Dose/Rate Route Frequency Ordered Stop   05/03/23 1115  cefTRIAXone (ROCEPHIN) 2 g in sodium chloride 0.9 % 100 mL IVPB        2 g 200 mL/hr over 30 Minutes Intravenous Every 24 hours 05/03/23 1018 05/06/23 1114   05/03/23 1115  azithromycin (ZITHROMAX) tablet 500 mg  Status:  Discontinued        500 mg Oral Daily 05/03/23 1018 05/03/23 1019   05/03/23 1115  doxycycline (VIBRA-TABS) tablet 100 mg        100 mg Oral Every 12 hours 05/03/23 1019 05/08/23 0959   05/02/23 2200  ceFEPIme (MAXIPIME) 1 g in sodium chloride 0.9 % 100 mL IVPB  Status:  Discontinued        1 g 200 mL/hr over 30 Minutes Intravenous Every 24 hours 05/02/23 1351 05/02/23 1615   05/02/23 2200  vancomycin (VANCOREADY) IVPB 1500 mg/300 mL        1,500 mg 150 mL/hr over 120 Minutes Intravenous  Once 05/02/23 1353 05/03/23 0021   05/02/23 1800  piperacillin-tazobactam (ZOSYN) IVPB 2.25 g  Status:  Discontinued        2.25 g 100 mL/hr over 30 Minutes Intravenous Every 8 hours 05/02/23 1628 05/03/23 1018   05/02/23 1400  azithromycin (ZITHROMAX) 500 mg in  sodium chloride 0.9 % 250 mL IVPB  Status:  Discontinued        500 mg 250 mL/hr over 60 Minutes Intravenous Every 24 hours 05/02/23 1318 05/03/23 1018   05/02/23 1351  vancomycin variable dose per unstable renal function (pharmacist dosing)  Status:  Discontinued         Does not apply See admin instructions 05/02/23 1351 05/03/23 1018   05/01/23 1300  cefTRIAXone (ROCEPHIN) 2 g in sodium chloride 0.9 % 100 mL IVPB  Status:  Discontinued        2 g 200 mL/hr over 30 Minutes Intravenous Every 24 hours 05/01/23 1127 05/02/23 1318         Subjective: Sitting up in bed.  Feels well.  States significant improvement with shortness of breath.  Denies any chest pain.  Denies any abdominal pain.  States he feels well that he could go to war.  Sats of 96% on room air.  Asking whether he can ambulate in the hallway.  Objective: Vitals:   05/04/23 2357 05/05/23 0215 05/05/23 0629 05/05/23 0833  BP: (!) 134/90  (!) 136/99 (!) (P) 138/95  Pulse: (!) 106 94 96 96  Resp: 20 20 20    Temp: 100.1 F (37.8 C)  98.6 F (37 C) (P) 98.3 F (36.8 C)  TempSrc: Oral  Oral (P) Oral  SpO2: 93% 94% 96% 94%  Weight:   72 kg   Height:  Intake/Output Summary (Last 24 hours) at 05/05/2023 1005 Last data filed at 05/05/2023 0900 Gross per 24 hour  Intake 480 ml  Output 4170 ml  Net -3690 ml   Filed Weights   05/02/23 1915 05/04/23 0447 05/05/23 0629  Weight: 73 kg 78 kg 72 kg    Examination:  General exam: NAD Respiratory system: Lungs clear to auscultation bilaterally.  No wheezes, no crackles, no rhonchi.  Fair air movement.  Speaking in full sentences.  No use of accessory muscles of respiration.   Cardiovascular system: Tachycardia.  No JVD, no murmurs rubs or gallops.  Trace bilateral lower extremity edema.  Gastrointestinal system: Abdomen is soft, nontender, nondistended, positive bowel sounds.  No rebound.  No guarding. Central nervous system: Alert and oriented. No focal neurological  deficits. Extremities: Significantly improved left upper extremity swelling around elbow and forearm.  Trace bilateral lower extremity edema.   Skin: Significantly improved left upper extremity erythema, edema and tenderness to palpation. Psychiatry: Judgement and insight appear normal. Mood & affect appropriate.     Data Reviewed: I have personally reviewed following labs and imaging studies  CBC: Recent Labs  Lab 05/01/23 0549 05/02/23 0556 05/02/23 0917 05/03/23 0536 05/04/23 0313 05/05/23 0239  WBC 10.0 14.6*  --  13.4* 11.7* 9.9  HGB 11.9* 13.5 12.6* 10.5* 11.5* 11.1*  HCT 33.7* 38.9* 37.0* 30.8* 33.8* 32.9*  MCV 88.2 88.4  --  90.9 90.9 90.6  PLT 129* 109*  --  116* 163 217    Basic Metabolic Panel: Recent Labs  Lab 04/30/23 0644 05/01/23 0549 05/01/23 0550 05/02/23 0555 05/02/23 0556 05/02/23 0917 05/03/23 0536 05/03/23 1645 05/04/23 0314 05/05/23 0239  NA 134*  --  132* 135  --  133* 133* 136 135 139  K 3.6  --  3.4* 3.7  --  3.5 3.5 3.6 3.4* 3.5  CL 103  --  99 96*  --   --  94* 97* 97* 98  CO2 15*  --  22 24  --   --  26 26 26 28   GLUCOSE 88  --  89 86  --   --  109* 129* 99 92  BUN 48*  --  38* 26*  --   --  24* 28* 31* 22*  CREATININE 10.77*  --  9.87* 8.37*  --   --  7.70* 8.92* 10.06* 8.46*  CALCIUM 7.7*  --  7.7* 7.9*  --   --  7.4* 8.0* 8.2* 8.5*  MG 1.7 1.7  --   --  2.0  --   --   --   --  1.9  PHOS 2.3*  --  2.7 1.7*  --   --  3.4  --  2.9 2.7    GFR: Estimated Creatinine Clearance: 13.6 mL/min (A) (by C-G formula based on SCr of 8.46 mg/dL (H)).  Liver Function Tests: Recent Labs  Lab 04/30/23 0644 05/01/23 0549 05/01/23 0550 05/02/23 0556 05/03/23 0536 05/04/23 0313 05/04/23 0314 05/05/23 0239  AST 695* 297*  --  142* 68* 60*  --   --   ALT 1,269* 937*  --  666* 390* 353*  --   --   ALKPHOS 63 67  --  71 59 57  --   --   BILITOT 2.2* 2.1*  --  1.1 1.0 0.8  --   --   PROT 5.2* 5.3*  --  5.7* 5.2* 5.9*  --   --   ALBUMIN 2.9*  2.7*   < >  2.7* 2.2*  2.2* 2.4* 2.4* 2.6*   < > = values in this interval not displayed.    CBG: Recent Labs  Lab 04/29/23 2151 05/02/23 1434  GLUCAP 85 106*     Recent Results (from the past 240 hours)  Respiratory (~20 pathogens) panel by PCR     Status: None   Collection Time: 05/02/23  8:27 AM   Specimen: Nasopharyngeal Swab; Respiratory  Result Value Ref Range Status   Adenovirus NOT DETECTED NOT DETECTED Final   Coronavirus 229E NOT DETECTED NOT DETECTED Final    Comment: (NOTE) The Coronavirus on the Respiratory Panel, DOES NOT test for the novel  Coronavirus (2019 nCoV)    Coronavirus HKU1 NOT DETECTED NOT DETECTED Final   Coronavirus NL63 NOT DETECTED NOT DETECTED Final   Coronavirus OC43 NOT DETECTED NOT DETECTED Final   Metapneumovirus NOT DETECTED NOT DETECTED Final   Rhinovirus / Enterovirus NOT DETECTED NOT DETECTED Final   Influenza A NOT DETECTED NOT DETECTED Final   Influenza B NOT DETECTED NOT DETECTED Final   Parainfluenza Virus 1 NOT DETECTED NOT DETECTED Final   Parainfluenza Virus 2 NOT DETECTED NOT DETECTED Final   Parainfluenza Virus 3 NOT DETECTED NOT DETECTED Final   Parainfluenza Virus 4 NOT DETECTED NOT DETECTED Final   Respiratory Syncytial Virus NOT DETECTED NOT DETECTED Final   Bordetella pertussis NOT DETECTED NOT DETECTED Final   Bordetella Parapertussis NOT DETECTED NOT DETECTED Final   Chlamydophila pneumoniae NOT DETECTED NOT DETECTED Final   Mycoplasma pneumoniae NOT DETECTED NOT DETECTED Final    Comment: Performed at [facility], [address] [place], [place] [address]  Resp panel by RT-PCR (RSV, Flu A&B, Covid) Anterior Nasal Swab     Status: None   Collection Time: 05/02/23  8:27 AM   Specimen: Anterior Nasal Swab  Result Value Ref Range Status   SARS Coronavirus 2 by RT PCR NEGATIVE NEGATIVE Final   Influenza A by PCR NEGATIVE NEGATIVE Final   Influenza B by PCR NEGATIVE NEGATIVE Final    Comment: (NOTE) The Xpert  Xpress SARS-CoV-2/FLU/RSV plus assay is intended as an aid in the diagnosis of influenza from Nasopharyngeal swab specimens and should not be used as a sole basis for treatment. Nasal washings and aspirates are unacceptable for Xpert Xpress SARS-CoV-2/FLU/RSV testing.  Fact Sheet for Patients: BloggerCourse.com  Fact Sheet for Healthcare Providers: SeriousBroker.it  This test is not yet approved or cleared by the [place] FDA and has been authorized for detection and/or diagnosis of SARS-CoV-2 by FDA under an Emergency Use Authorization (EUA). This EUA will remain in effect (meaning this test can be used) for the duration of the COVID-19 declaration under Section 564(b)(1) of the Act, 21 U.S.C. section 360bbb-3(b)(1), unless the authorization is terminated or revoked.     Resp Syncytial Virus by PCR NEGATIVE NEGATIVE Final    Comment: (NOTE) Fact Sheet for Patients: BloggerCourse.com  Fact Sheet for Healthcare Providers: SeriousBroker.it  This test is not yet approved or cleared by the [place] FDA and has been authorized for detection and/or diagnosis of SARS-CoV-2 by FDA under an Emergency Use Authorization (EUA). This EUA will remain in effect (meaning this test can be used) for the duration of the COVID-19 declaration under Section 564(b)(1) of the Act, 21 U.S.C. section 360bbb-3(b)(1), unless the authorization is terminated or revoked.  Performed at [facility], [address], [place], [place] [address]   Culture, blood (Routine X 2) w Reflex to  ID Panel     Status: None (Preliminary result)   Collection Time: 05/02/23  1:14 PM   Specimen: BLOOD  Result Value Ref Range Status   Specimen Description BLOOD BLOOD LEFT HAND  Final   Special Requests AEROBIC BOTTLE ONLY Blood Culture adequate volume  Final   Culture   Final    NO GROWTH 3 DAYS Performed  at [facility], [address] [place], [place] [address]    Report Status PENDING  Incomplete  Culture, blood (Routine X 2) w Reflex to ID Panel     Status: None (Preliminary result)   Collection Time: 05/02/23  1:14 PM   Specimen: BLOOD LEFT ARM  Result Value Ref Range Status   Specimen Description BLOOD LEFT ARM  Final   Special Requests AEROBIC BOTTLE ONLY Blood Culture adequate volume  Final   Culture   Final    NO GROWTH 3 DAYS Performed at [facility], [address], [place], [place] [address]    Report Status PENDING  Incomplete  MRSA Next Gen by PCR, Nasal     Status: None   Collection Time: 05/02/23  2:39 PM   Specimen: Nasal Mucosa; Nasal Swab  Result Value Ref Range Status   MRSA by PCR Next Gen NOT DETECTED NOT DETECTED Final    Comment: (NOTE) The GeneXpert MRSA Assay (FDA approved for NASAL specimens only), is one component of a comprehensive MRSA colonization surveillance program. It is not intended to diagnose MRSA infection nor to guide or monitor treatment for MRSA infections. Test performance is not FDA approved in patients less than 62 years old. Performed at [facility], [address], [place], [place] [address]          Radiology Studies: VAS [place] LOWER EXTREMITY VENOUS (DVT) Result Date: 05/04/2023  Lower Venous DVT Study Patient Name:  SIMCHA SPEIR  Date of Exam:   05/03/2023 Medical Rec #: [id number]        Accession #:    [id number] Date of Birth: Nov 24, 1995        Patient Gender: M Patient Age:   27 years Exam Location:  [facility] Procedure:      VAS [place] LOWER EXTREMITY VENOUS (DVT) Referring Phys: Zenia Resides --------------------------------------------------------------------------------  Indications: Edema.  Comparison Study: No prior exam. Performing Technologist: Fernande Bras  Examination Guidelines: A complete evaluation includes B-mode imaging, spectral Doppler, color Doppler, and power Doppler as needed of  all accessible portions of each vessel. Bilateral testing is considered an integral part of a complete examination. Limited examinations for reoccurring indications may be performed as noted. The reflux portion of the exam is performed with the patient in reverse Trendelenburg.  +---------+---------------+---------+-----------+----------+--------------+ RIGHT    CompressibilityPhasicitySpontaneityPropertiesThrombus Aging +---------+---------------+---------+-----------+----------+--------------+ CFV      Full           Yes      Yes                                 +---------+---------------+---------+-----------+----------+--------------+ SFJ      Full           Yes      Yes                                 +---------+---------------+---------+-----------+----------+--------------+ FV Prox  Full                                                        +---------+---------------+---------+-----------+----------+--------------+  FV Mid   Full                                                        +---------+---------------+---------+-----------+----------+--------------+ FV DistalFull                                                        +---------+---------------+---------+-----------+----------+--------------+ PFV      Full                                                        +---------+---------------+---------+-----------+----------+--------------+ POP      Full           Yes      Yes                                 +---------+---------------+---------+-----------+----------+--------------+ PTV      Full                                                        +---------+---------------+---------+-----------+----------+--------------+ PERO     Full                                                        +---------+---------------+---------+-----------+----------+--------------+    +---------+---------------+---------+-----------+----------+--------------+ LEFT     CompressibilityPhasicitySpontaneityPropertiesThrombus Aging +---------+---------------+---------+-----------+----------+--------------+ CFV      Full           Yes      Yes                                 +---------+---------------+---------+-----------+----------+--------------+ SFJ      Full           Yes      Yes                                 +---------+---------------+---------+-----------+----------+--------------+ FV Prox  Full                                                        +---------+---------------+---------+-----------+----------+--------------+ FV Mid   Full                                                        +---------+---------------+---------+-----------+----------+--------------+  FV DistalFull                                                        +---------+---------------+---------+-----------+----------+--------------+ PFV      Full                                                        +---------+---------------+---------+-----------+----------+--------------+ POP      Full           Yes      Yes                                 +---------+---------------+---------+-----------+----------+--------------+ PTV      Full                                                        +---------+---------------+---------+-----------+----------+--------------+ PERO     Full                                                        +---------+---------------+---------+-----------+----------+--------------+     Summary: BILATERAL: - No evidence of deep vein thrombosis seen in the lower extremities, bilaterally. -No evidence of popliteal cyst, bilaterally.   *See table(s) above for measurements and observations. Electronically signed by Heath Lark on 05/04/2023 at 2:22:47 PM.    Final         Scheduled Meds:  calcium carbonate  1 tablet Oral BID  WC   chlordiazePOXIDE  25 mg Oral TID   Followed by   Melene Muller ON 05/06/2023] chlordiazePOXIDE  25 mg Oral BH-qamhs   Followed by   Melene Muller ON 05/07/2023] chlordiazePOXIDE  25 mg Oral Daily   Chlorhexidine Gluconate Cloth  6 each Topical Q0600   Chlorhexidine Gluconate Cloth  6 each Topical Q0600   doxycycline  100 mg Oral Q12H   folic acid  1 mg Oral Daily   furosemide  80 mg Intravenous Q12H   heparin  5,000 Units Subcutaneous Q8H   loratadine  10 mg Oral Daily   multivitamin with minerals  1 tablet Oral Daily   pantoprazole  40 mg Oral BID AC   sodium chloride flush  3 mL Intravenous Q12H   thiamine  100 mg Oral Daily   Vitamin D (Ergocalciferol)  50,000 Units Oral Q7 days   Continuous Infusions:  cefTRIAXone (ROCEPHIN)  IV 2 g (05/04/23 1215)     LOS: 8 days    Time spent: 40 minutes    Ramiro Harvest, MD [facility]   To contact the attending provider between 7A-7P or the covering provider during after hours 7P-7A, please log into the web site [email or web address] and access using universal [place] password for that web site. If you do not have the password, please call the hospital operator.  05/05/2023, 10:05 AM

## 2023-05-05 NOTE — Progress Notes (Signed)
 Mobility Specialist Progress Note:   05/05/23 1200  Mobility  Activity Ambulated independently in hallway  Level of Assistance Independent  Assistive Device None  Distance Ambulated (ft) 500 ft  Activity Response Tolerated well  Mobility Referral Yes  Mobility visit 1 Mobility  Mobility Specialist Start Time (ACUTE ONLY) 1200  Mobility Specialist Stop Time (ACUTE ONLY) 1210  Mobility Specialist Time Calculation (min) (ACUTE ONLY) 10 min   Pt agreeable to mobility session. No physical assistance required. Asx throughout, HR low 100s. Pt back in bed with all needs met.   [name] Mobility Specialist Please contact via SecureChat or  Rehab office at [phone]

## 2023-05-06 LAB — COMPREHENSIVE METABOLIC PANEL
ALT: 221 U/L — ABNORMAL HIGH (ref 0–44)
AST: 44 U/L — ABNORMAL HIGH (ref 15–41)
Albumin: 2.6 g/dL — ABNORMAL LOW (ref 3.5–5.0)
Alkaline Phosphatase: 50 U/L (ref 38–126)
Anion gap: 9 (ref 5–15)
BUN: 34 mg/dL — ABNORMAL HIGH (ref 6–20)
CO2: 29 mmol/L (ref 22–32)
Calcium: 8.7 mg/dL — ABNORMAL LOW (ref 8.9–10.3)
Chloride: 102 mmol/L (ref 98–111)
Creatinine, Ser: 10.21 mg/dL — ABNORMAL HIGH (ref 0.61–1.24)
GFR, Estimated: 7 mL/min — ABNORMAL LOW (ref >60)
Glucose, Bld: 135 mg/dL — ABNORMAL HIGH (ref 70–99)
Potassium: 3.5 mmol/L (ref 3.5–5.1)
Sodium: 140 mmol/L (ref 135–145)
Total Bilirubin: 0.8 mg/dL (ref 0.0–1.2)
Total Protein: 6.2 g/dL — ABNORMAL LOW (ref 6.5–8.1)

## 2023-05-06 LAB — CBC WITH DIFFERENTIAL/PLATELET
Abs Immature Granulocytes: 0.05 10*3/uL (ref 0.00–0.07)
Basophils Absolute: 0.1 10*3/uL (ref 0.0–0.1)
Basophils Relative: 1 %
Eosinophils Absolute: 0.4 10*3/uL (ref 0.0–0.5)
Eosinophils Relative: 5 %
HCT: 30.1 % — ABNORMAL LOW (ref 39.0–52.0)
Hemoglobin: 10.2 g/dL — ABNORMAL LOW (ref 13.0–17.0)
Immature Granulocytes: 1 %
Lymphocytes Relative: 29 %
Lymphs Abs: 2.4 10*3/uL (ref 0.7–4.0)
MCH: 31.5 pg (ref 26.0–34.0)
MCHC: 33.9 g/dL (ref 30.0–36.0)
MCV: 92.9 fL (ref 80.0–100.0)
Monocytes Absolute: 1.5 10*3/uL — ABNORMAL HIGH (ref 0.1–1.0)
Monocytes Relative: 17 %
Neutro Abs: 4 10*3/uL (ref 1.7–7.7)
Neutrophils Relative %: 47 %
Platelets: 256 10*3/uL (ref 150–400)
RBC: 3.24 MIL/uL — ABNORMAL LOW (ref 4.22–5.81)
RDW: 13.9 % (ref 11.5–15.5)
WBC: 8.4 10*3/uL (ref 4.0–10.5)
nRBC: 0 % (ref 0.0–0.2)

## 2023-05-06 LAB — CK: Total CK: 485 U/L — ABNORMAL HIGH (ref 49–397)

## 2023-05-06 LAB — PHOSPHORUS: Phosphorus: 2.6 mg/dL (ref 2.5–4.6)

## 2023-05-06 NOTE — Plan of Care (Signed)
  Problem: Education: Goal: Knowledge of General Education information will improve Description: Including pain rating scale, medication(s)/side effects and non-pharmacologic comfort measures Outcome: Progressing   Problem: Health Behavior/Discharge Planning: Goal: Ability to manage health-related needs will improve Outcome: Progressing   Problem: Clinical Measurements: Goal: Ability to maintain clinical measurements within normal limits will improve Outcome: Progressing Goal: Will remain free from infection Outcome: Progressing Goal: Diagnostic test results will improve Outcome: Progressing Goal: Respiratory complications will improve Outcome: Progressing Goal: Cardiovascular complication will be avoided Outcome: Progressing   Problem: Activity: Goal: Risk for activity intolerance will decrease Outcome: Progressing   Problem: Nutrition: Goal: Adequate nutrition will be maintained Outcome: Progressing   Problem: Coping: Goal: Level of anxiety will decrease Outcome: Progressing   Problem: Elimination: Goal: Will not experience complications related to bowel motility Outcome: Progressing Goal: Will not experience complications related to urinary retention Outcome: Progressing   Problem: Pain Managment: Goal: General experience of comfort will improve and/or be controlled Outcome: Progressing   Problem: Safety: Goal: Ability to remain free from injury will improve Outcome: Progressing   Problem: Skin Integrity: Goal: Risk for impaired skin integrity will decrease Outcome: Progressing   Problem: Activity: Goal: Ability to tolerate increased activity will improve Outcome: Progressing   Problem: Respiratory: Goal: Ability to maintain adequate ventilation will improve Outcome: Progressing Goal: Ability to maintain a clear airway will improve Outcome: Progressing

## 2023-05-06 NOTE — TOC Progression Note (Signed)
 Transition of Care [facility]) - Progression Note    Patient Details  Name: Andrew Page MRN: [id number] Date of Birth: Dec 08, 1995  [facility] CM/SW Contact  Leone Haven, RN Phone Number: 05/06/2023, 12:36 PM  Clinical Narrative:    MD  Continue to monitor kidneys,  TOC following.         Expected Discharge Plan and Services                                               Social Determinants of Health (SDOH) Interventions SDOH Screenings   Food Insecurity: No Food Insecurity (04/28/2023)  Housing: Low Risk  (04/28/2023)  Transportation Needs: No Transportation Needs (04/28/2023)  Utilities: Not At Risk (04/28/2023)  Tobacco Use: High Risk (04/28/2023)    Readmission Risk Interventions     No data to display

## 2023-05-06 NOTE — Telephone Encounter (Signed)
 Per Dr Shirlee Latch, pt seen by him when he was on 2H, should f/u with [facility] for outpatient cardiology, does not need to be seen in [facility]. Note sent to Yvonna Alanis, PA

## 2023-05-06 NOTE — Progress Notes (Signed)
 [facility] NEPHROLOGY PROGRESS NOTE  Assessment/ Plan: Pt is a 28 y.o. yo male  with alcohol abuse presented with rhabdomyolysis, AKI and features of liver injury.   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  Started IV fluid without major urine output and worsening renal labs.    Started HD on 3/12  temporary HD catheter by IR. HD 3/11 and 3/12-  then seemingly starting to make some urine.  However, the amount of urine was unclear and crt rising off of HD-  needed another HD on 3/15 mostly for inc o2 req.   Now will keep lasix on board then continue to follow daily for HD need.    He does not need renal diet-we will remove fluid restriction as his urine output has increased substantially.  I wonder if he is in the polyuric phase of ATN.    No signs of recovery in the next 24 or 48 hours will request conversion to a tunneled catheter and CLIP for AKI.  #Metabolic acidosis: Corrected with dialysis.   #Acute transaminitis, seen by GI thought to be due to rhabdomyolysis. Improved    #Alcohol abuse: Continue vitamins and withdrawal protocol.  Supportive care.dont know if he has a psych diagnosis-  almost seems manic   #Hypokalemia: Adjust potassium bath during HD. On reg diet in terms of potassium   #Acute febrile, acute hypoxic respiratory failure:  Concerning for infection however respiratory viral panel negative.  UF with dialysis fixed in the past.     Subjective:  2055 UOP recorded, on lasix 80 q 12-   back  on room air.  Labs reflective of HD -  he wants to go home but I discussed it with him and would like to see stabilization of renal function..    Objective Vital signs in last 24 hours: Vitals:   05/06/23 0250 05/06/23 0259 05/06/23 0300 05/06/23 0600  BP:  (!) 144/102 (!) 144/102   Pulse:  (!) 101 (!) 105    Resp:   18   Temp:   97.9 F (36.6 C)   TempSrc:   Oral   SpO2: 95%  98%   Weight:    70.6 kg  Height:       Weight change: -1.375 kg  Intake/Output Summary (Last 24 hours) at 05/06/2023 0715 Last data filed at 05/06/2023 0600 Gross per 24 hour  Intake 1180 ml  Output 2055 ml  Net -875 ml       Labs: RENAL PANEL Recent Labs  Lab 04/30/23 0644 05/01/23 0549 05/01/23 0550 05/02/23 0555 05/02/23 0556 05/02/23 0917 05/03/23 0536 05/03/23 1645 05/04/23 0313 05/04/23 0314 05/05/23 0239 05/06/23 0314  NA 134*  --    < > 135  --    < > 133* 136  --  135 139 140  K 3.6  --    < > 3.7  --    < > 3.5 3.6  --  3.4* 3.5 3.5  CL 103  --    < > 96*  --   --  94* 97*  --  97* 98 102  CO2 15*  --    < > 24  --   --  26 26  --  26 28 29   GLUCOSE 88  --    < > 86  --   --  109* 129*  --  99  92 135*  BUN 48*  --    < > 26*  --   --  24* 28*  --  31* 22* 34*  CREATININE 10.77*  --    < > 8.37*  --   --  7.70* 8.92*  --  10.06* 8.46* 10.21*  CALCIUM 7.7*  --    < > 7.9*  --   --  7.4* 8.0*  --  8.2* 8.5* 8.7*  MG 1.7 1.7  --   --  2.0  --   --   --   --   --  1.9  --   PHOS 2.3*  --    < > 1.7*  --   --  3.4  --   --  2.9 2.7 2.6  ALBUMIN 2.9* 2.7*   < > 2.8* 2.7*  --  2.2*  2.2*  --  2.4* 2.4* 2.6* 2.6*   < > = values in this interval not displayed.    Liver Function Tests: Recent Labs  Lab 05/03/23 0536 05/04/23 0313 05/04/23 0314 05/05/23 0239 05/06/23 0314  AST 68* 60*  --   --  44*  ALT 390* 353*  --   --  221*  ALKPHOS 59 57  --   --  50  BILITOT 1.0 0.8  --   --  0.8  PROT 5.2* 5.9*  --   --  6.2*  ALBUMIN 2.2*  2.2* 2.4* 2.4* 2.6* 2.6*   No results for input(s): "LIPASE", "AMYLASE" in the last 168 hours.  No results for input(s): "AMMONIA" in the last 168 hours. CBC: Recent Labs    04/29/23 1200 04/30/23 0644 05/02/23 0917 05/03/23 0536 05/04/23 0313 05/05/23 0239 05/06/23 0314  HGB  --    < > 12.6* 10.5* 11.5* 11.1* 10.2*  MCV  --    < >  --  90.9  90.9 90.6 92.9  VITAMINB12 4,105*  --   --   --   --   --   --   FOLATE >40.0  --   --   --   --   --   --   TIBC 259  --   --   --   --   --   --   IRON 58  --   --   --   --   --   --    < > = values in this interval not displayed.    Cardiac Enzymes: Recent Labs  Lab 04/30/23 0644 05/01/23 0549 05/02/23 0556 05/03/23 0536 05/06/23 0314  CKTOTAL 14,724* 7,243* 3,392* 1,205* 485*   CBG: Recent Labs  Lab 04/29/23 2151 05/02/23 1434  GLUCAP 85 106*    Iron Studies:  No results for input(s): "IRON", "TIBC", "TRANSFERRIN", "FERRITIN" in the last 72 hours.  Studies/Results: No results found.   Medications: Infusions:    Scheduled Medications:  calcium carbonate  1 tablet Oral BID WC   chlordiazePOXIDE  25 mg Oral BH-qamhs   Followed by   Melene Muller ON 05/07/2023] chlordiazePOXIDE  25 mg Oral Daily   Chlorhexidine Gluconate Cloth  6 each Topical Q0600   doxycycline  100 mg Oral Q12H   folic acid  1 mg Oral Daily   furosemide  80 mg Intravenous Q12H   heparin  5,000 Units Subcutaneous Q8H   loratadine  10 mg Oral Daily   multivitamin with minerals  1 tablet Oral Daily   pantoprazole  40 mg Oral BID AC  sodium chloride flush  3 mL Intravenous Q12H   thiamine  100 mg Oral Daily   Vitamin D (Ergocalciferol)  50,000 Units Oral Q7 days    have reviewed scheduled and prn medications.  Physical Exam: General: well appearing male Heart:RRR, s1s2 nl Lungs: much clearer Abdomen:soft, Non-tender, non-distended Extremities: less edema  Dialysis Access: Temporary HD catheter placed 3/12  Lafe Clerk W 05/06/2023,7:15 AM  LOS: 9 days

## 2023-05-06 NOTE — Progress Notes (Signed)
 PROGRESS NOTE    Andrew Page  UXL:[id number] DOB: 1995/12/30 DOA: 04/27/2023 PCP: Patient, No Pcp Per    Chief Complaint  Patient presents with   Drug / Alcohol Assessment   Emesis    Brief Narrative:  28 year old with history of anxiety and depression, EtOH and polysubstance use was admitted yesterday with nausea and vomiting after 5 days of alcohol binging. Workup revealed rhabdomyolysis and AKI with CK of 26,000 and creatinine 3.2. Patient was also noted to have elevated transaminases with AST greater than 7000 ALT around 5000 with INR of 1.5 and bili of 1.5. Tylenol level was undetectable however he was started on NAC.  -Patient subsequently developed an uric worsening renal function, seen by nephrology and with no improvement was started on HD on 04/30/2023.  Patient also seen by GI who feel likely elevated transaminitis secondary to rhabdomyolysis.   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 -Creatinine went from 4.5>> 8.16>>> 10.77>>>> 9.87>>>> 8.37>>> 7.70>>> 8.92>>> 10.06>>> 8.46>>> 10.21. -Renal ultrasound done with diffusely increased renal cortical echogenicity in keeping with changes of underlying medical renal disease. -Patient seen in consultation by nephrology and due to worsening renal function HD initiated. -Temporary  HD catheter placed by IR on 04/30/2023 and patient underwent first hemodialysis per nephrology recommendations on 04/30/2023 which he tolerated. -Patient with worsening shortness of breath, tachypnea, concern for volume overload on 05/01/2023 and patient underwent hemodialysis with a liter of fluid removed however patient is still with worsening shortness of breath, tachypneic, increased O2 requirements and looks volume overloaded on examination on 05/02/2023 and underwent emergent hemodialysis. -Patient given IV Lasix challenge on 05/03/2023, 05/04/2023. -Continue Lasix 80 mg IV every 12 hours per nephrology recommendation. -Patient underwent hemodialysis on 05/04/2023 with 3.5 L of fluid removed. -Patient urine output of approximately 2 L over the past 24 hours. -Continue Lasix daily for now and assess daily for HD needs per nephrology recommendations. -Nephrology following and appreciate input and recommendations..  2.  Acute respiratory failure with hypoxia secondary to acute diastolic CHF exacerbation secondary to volume overload from acute renal failure -Likely secondary to volume overload secondary to problem #1. -Patient on 05/02/2023 noted to be in significant volume overload despite hemodialysis on 05/01/2023 with 1 L of fluid removed. -Patient also noted to spike a fever as high as 101.8 on 05/02/2023. -Respiratory viral panel done was negative, influenza A and B PCR negative, COVID-19 PCR negative.  Chest x-ray consistent with volume overload. -ABG obtained with a pH of 7.414, pCO2 of 40, pO2 of 72, bicarb of 25. -Urine Legionella antigen pending, urine pneumococcus antigen pending. -Patient transferred to progressive care unit and placed on the BiPAP with transient clinical improvement, PCCM consulted. -Patient given a Lasix challenge IV. -Patient subsequently underwent emergent hemodialysis on 05/02/2023 with 4 L of fluid removed without clinical improvement. -Patient also placed on broaden  antibiotic coverage of vancomycin, cefepime, azithromycin as patient noted to have had a fever. -Patient seen and followed by PCCM on 05/02/2023 and 05/03/2023. -Antibiotic coverage subsequently narrowed back to IV Rocephin and doxycycline with recommendations to complete a 7-day course. -Patient received IV Lasix challenge on 05/03/2023 as well as 05/04/2023.  -Urine output of 2 L recorded over the past 24 hours.  -Patient underwent hemodialysis on 05/04/2023 with 3.5 L of fluid removed.  -Clinical improvement.  -Currently on Lasix 80 mg IV every 12 hours per nephrology recommendations.  -Per nephrology continue Lasix and continue to follow daily for HD needs.  -Continue BiPAP as needed. -Nephrology following.  3.  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 -Patient seen in consultation with cardiology who reviewed 2D echo and it was felt MR did not appear severe however was recommending repeat 2D echo versus TEE next week after HD and improvement with fluid overload. -Cardiology recommending outpatient follow-up. -Cardiology was following but has signed off as of 05/04/2023.   4.  Metabolic acidosis secondary to acute renal failure -Was on bicarb drip which has subsequently been discontinued once patient started on hemodialysis.   -Metabolic acidosis resolved with HD.   -Per nephrology.  5.  Hypokalemia/hypophosphatemia -Potassium at 3.5 this morning, phosphorus at 2.6. -Patient currently undergoing hemodialysis during this hospitalization and will defer electrolyte management to nephrology.   6.  Rhabdomyolysis -Felt secondary to alcohol use and laying on the floor. -CK went from 32K>>> 24,888>>14724>>>7243>>> 3392>>> 1205>>> 485. -Initially placed on IV fluids with some improvement however IV fluids discontinued on 04/30/2023 as patient noted to be volume overloaded and in acute renal failure and subsequently started on hemodialysis. -CK trending down we will  continue to follow. -On HD. -Nephrology following.  7.  Alcohol induced hepatitis/abnormal LFTs -Transaminitis felt likely secondary to alcohol induced in the setting of rhabdomyolysis. -Patient seen in consultation by GI, Dr. Adela Lank who feels likely not a primary liver issue and elevated likely secondary to significant rhabdomyolysis versus alcohol abuse. -No further liver workup planned at this time. -NAC discontinued. -LFTs trending down. -Follow.  8.  Hypocalcemia -Corrected calcium now at 9.6 felt likely secondary to vitamin D deficiency. -Patient with no neuromuscular dysfunction. -EKG without QT prolongation. -Intact PTH noted at 187. -Continue Os-Cal.  9.  Alcohol abuse -Was on Ativan withdrawal protocol which has been completed.. -Was on Librium detox protocol which was completed initially.  -Placed back on Librium detox protocol on 05/04/2023.   -Continue thiamine, folic acid, multivitamin. -Alcohol cessation stressed to patient.  10.  Avulsion fracture right ankle -Supportive care, postop shoe per orthopedics.  11.  Vitamin D deficiency -Continue vitamin D 50,000 units weekly. -Will need outpatient follow-up with PCP for repeat vitamin D levels in 3 to 6 months.  12.  Left upper extremity swelling/left upper extremity cellulitis -Patient noted to have some swelling around the left elbow and left forearm with some erythema. -Plain films of the left elbow and left forearm with no acute abnormalities noted. -Improved clinically.  -Was on IV Rocephin and antibiotic coverage broadened due to fevers and worsening respiratory status on 05/02/2023, currently back on IV Rocephin.   -Status post 5 days IV Rocephin for presumptive cellulitis.    13.  Anemia -Questionable etiology. -Patient with no overt bleeding. -Anemia panel with iron level of 58, TIBC of 259, folate greater than 40. -Per mother patient with a bout of hematemesis prior to admission and since  then. -  Hemoglobin currently stable at 10.2. -Patient with history of alcohol abuse, continue PPI twice daily.      DVT prophylaxis: Heparin Code Status: Full Family Communication: Updated patient.  No family at bedside. Disposition: Remain in progressive care floor for now.    Status is: Inpatient Remains inpatient appropriate because: Severity of illness   Consultants:  Gastroenterology: Dr. Adela Lank 04/28/2023 Nephrology: Dr. Ronalee Belts 04/29/2023 PCCM: Dr. Merrily Pew 05/02/2023 Cardiology: Dr. Shirlee Latch 05/03/2023  Procedures:  Plan films of the left forearm 05/01/2023 Chest x-ray 04/27/2023, 04/28/2023, 05/01/2023, 05/02/2023 Plain films of the left elbow 05/01/2023 Right upper quadrant ultrasound 04/27/2023 Renal ultrasound 04/27/2023 Ultrasound of the liver 04/28/2023 Image guided temporary HD catheter placement per IR: Dr. Elby Showers 04/30/2023 2D echo 05/03/2023 Lower extremity Dopplers 05/03/2023  Antimicrobials:  Anti-infectives (From admission, onward)    Start     Dose/Rate Route Frequency Ordered Stop   05/03/23 1115  cefTRIAXone (ROCEPHIN) 2 g in sodium chloride 0.9 % 100 mL IVPB        2 g 200 mL/hr over 30 Minutes Intravenous Every 24 hours 05/03/23 1018 05/05/23 1201   05/03/23 1115  azithromycin (ZITHROMAX) tablet 500 mg  Status:  Discontinued        500 mg Oral Daily 05/03/23 1018 05/03/23 1019   05/03/23 1115  doxycycline (VIBRA-TABS) tablet 100 mg        100 mg Oral Every 12 hours 05/03/23 1019 05/08/23 0959   05/02/23 2200  ceFEPIme (MAXIPIME) 1 g in sodium chloride 0.9 % 100 mL IVPB  Status:  Discontinued        1 g 200 mL/hr over 30 Minutes Intravenous Every 24 hours 05/02/23 1351 05/02/23 1615   05/02/23 2200  vancomycin (VANCOREADY) IVPB 1500 mg/300 mL        1,500 mg 150 mL/hr over 120 Minutes Intravenous  Once 05/02/23 1353 05/03/23 0021   05/02/23 1800  piperacillin-tazobactam (ZOSYN) IVPB 2.25 g  Status:  Discontinued        2.25 g 100 mL/hr over 30 Minutes Intravenous  Every 8 hours 05/02/23 1628 05/03/23 1018   05/02/23 1400  azithromycin (ZITHROMAX) 500 mg in sodium chloride 0.9 % 250 mL IVPB  Status:  Discontinued        500 mg 250 mL/hr over 60 Minutes Intravenous Every 24 hours 05/02/23 1318 05/03/23 1018   05/02/23 1351  vancomycin variable dose per unstable renal function (pharmacist dosing)  Status:  Discontinued         Does not apply See admin instructions 05/02/23 1351 05/03/23 1018   05/01/23 1300  cefTRIAXone (ROCEPHIN) 2 g in sodium chloride 0.9 % 100 mL IVPB  Status:  Discontinued        2 g 200 mL/hr over 30 Minutes Intravenous Every 24 hours 05/01/23 1127 05/02/23 1318         Subjective: Patient sitting up in bed.  Denies any significant shortness of breath.  Feels well.  States has been having urine output.  Stated ambulated in hallway.  Denies any chest pain no abdominal pain.  Wondering when he is going to be able to go home as he does not want to miss his daughter's birthday on April 3.    Objective: Vitals:   05/06/23 0300 05/06/23 0600 05/06/23 0728 05/06/23 1106  BP: (!) 144/102  (!) 130/100 (!) 134/95  Pulse: (!) 105  92 94  Resp: 18  (!) 24 18  Temp: 97.9 F (36.6 C)  98.7 F (37.1 C) 98.2 F (36.8  C)  TempSrc: Oral  Oral Oral  SpO2: 98%  97% 98%  Weight:  70.6 kg    Height:        Intake/Output Summary (Last 24 hours) at 05/06/2023 1138 Last data filed at 05/06/2023 0600 Gross per 24 hour  Intake 820 ml  Output 2055 ml  Net -1235 ml   Filed Weights   05/04/23 0447 05/05/23 0629 05/06/23 0600  Weight: 78 kg 72 kg 70.6 kg    Examination:  General exam: NAD Respiratory system: Some scattered crackles.  No wheezing.  No rhonchi.  Fair air movement.  Speaking in full sentences.  No use of accessory muscles of respiration.  Cardiovascular system: RRR no murmurs rubs or gallops.  No JVD.  Trace bilateral lower extremity edema. Gastrointestinal system: Abdomen is soft, nontender, nondistended, positive bowel  sounds.  No rebound.  No guarding.  Central nervous system: Alert and oriented. No focal neurological deficits. Extremities: Significantly improved left upper extremity swelling around elbow and forearm.  Trace bilateral lower extremity edema.   Skin: Significantly improved left upper extremity erythema, edema and tenderness to palpation. Psychiatry: Judgement and insight appear normal. Mood & affect appropriate.     Data Reviewed: I have personally reviewed following labs and imaging studies  CBC: Recent Labs  Lab 05/02/23 0556 05/02/23 0917 05/03/23 0536 05/04/23 0313 05/05/23 0239 05/06/23 0314  WBC 14.6*  --  13.4* 11.7* 9.9 8.4  NEUTROABS  --   --   --   --   --  4.0  HGB 13.5 12.6* 10.5* 11.5* 11.1* 10.2*  HCT 38.9* 37.0* 30.8* 33.8* 32.9* 30.1*  MCV 88.4  --  90.9 90.9 90.6 92.9  PLT 109*  --  116* 163 217 256    Basic Metabolic Panel: Recent Labs  Lab 04/30/23 0644 05/01/23 0549 05/01/23 0550 05/02/23 0555 05/02/23 0556 05/02/23 0917 05/03/23 0536 05/03/23 1645 05/04/23 0314 05/05/23 0239 05/06/23 0314  NA 134*  --    < > 135  --    < > 133* 136 135 139 140  K 3.6  --    < > 3.7  --    < > 3.5 3.6 3.4* 3.5 3.5  CL 103  --    < > 96*  --   --  94* 97* 97* 98 102  CO2 15*  --    < > 24  --   --  26 26 26 28 29   GLUCOSE 88  --    < > 86  --   --  109* 129* 99 92 135*  BUN 48*  --    < > 26*  --   --  24* 28* 31* 22* 34*  CREATININE 10.77*  --    < > 8.37*  --   --  7.70* 8.92* 10.06* 8.46* 10.21*  CALCIUM 7.7*  --    < > 7.9*  --   --  7.4* 8.0* 8.2* 8.5* 8.7*  MG 1.7 1.7  --   --  2.0  --   --   --   --  1.9  --   PHOS 2.3*  --    < > 1.7*  --   --  3.4  --  2.9 2.7 2.6   < > = values in this interval not displayed.    GFR: Estimated Creatinine Clearance: 11 mL/min (A) (by C-G formula based on SCr of 10.21 mg/dL (H)).  Liver Function Tests: Recent Labs  Lab 05/01/23  1610 05/01/23 0550 05/02/23 0556 05/03/23 0536 05/04/23 0313 05/04/23 0314  05/05/23 0239 05/06/23 0314  AST 297*  --  142* 68* 60*  --   --  44*  ALT 937*  --  666* 390* 353*  --   --  221*  ALKPHOS 67  --  71 59 57  --   --  50  BILITOT 2.1*  --  1.1 1.0 0.8  --   --  0.8  PROT 5.3*  --  5.7* 5.2* 5.9*  --   --  6.2*  ALBUMIN 2.7*   < > 2.7* 2.2*  2.2* 2.4* 2.4* 2.6* 2.6*   < > = values in this interval not displayed.    CBG: Recent Labs  Lab 04/29/23 2151 05/02/23 1434  GLUCAP 85 106*     Recent Results (from the past 240 hours)  Respiratory (~20 pathogens) panel by PCR     Status: None   Collection Time: 05/02/23  8:27 AM   Specimen: Nasopharyngeal Swab; Respiratory  Result Value Ref Range Status   Adenovirus NOT DETECTED NOT DETECTED Final   Coronavirus 229E NOT DETECTED NOT DETECTED Final    Comment: (NOTE) The Coronavirus on the Respiratory Panel, DOES NOT test for the novel  Coronavirus (2019 nCoV)    Coronavirus HKU1 NOT DETECTED NOT DETECTED Final   Coronavirus NL63 NOT DETECTED NOT DETECTED Final   Coronavirus OC43 NOT DETECTED NOT DETECTED Final   Metapneumovirus NOT DETECTED NOT DETECTED Final   Rhinovirus / Enterovirus NOT DETECTED NOT DETECTED Final   Influenza A NOT DETECTED NOT DETECTED Final   Influenza B NOT DETECTED NOT DETECTED Final   Parainfluenza Virus 1 NOT DETECTED NOT DETECTED Final   Parainfluenza Virus 2 NOT DETECTED NOT DETECTED Final   Parainfluenza Virus 3 NOT DETECTED NOT DETECTED Final   Parainfluenza Virus 4 NOT DETECTED NOT DETECTED Final   Respiratory Syncytial Virus NOT DETECTED NOT DETECTED Final   Bordetella pertussis NOT DETECTED NOT DETECTED Final   Bordetella Parapertussis NOT DETECTED NOT DETECTED Final   Chlamydophila pneumoniae NOT DETECTED NOT DETECTED Final   Mycoplasma pneumoniae NOT DETECTED NOT DETECTED Final    Comment: Performed at [facility], [address], [place], [place] [address]  Resp panel by RT-PCR (RSV, Flu A&B, Covid) Anterior Nasal Swab     Status: None   Collection  Time: 05/02/23  8:27 AM   Specimen: Anterior Nasal Swab  Result Value Ref Range Status   SARS Coronavirus 2 by RT PCR NEGATIVE NEGATIVE Final   Influenza A by PCR NEGATIVE NEGATIVE Final   Influenza B by PCR NEGATIVE NEGATIVE Final    Comment: (NOTE) The Xpert Xpress SARS-CoV-2/FLU/RSV plus assay is intended as an aid in the diagnosis of influenza from Nasopharyngeal swab specimens and should not be used as a sole basis for treatment. Nasal washings and aspirates are unacceptable for Xpert Xpress SARS-CoV-2/FLU/RSV testing.  Fact Sheet for Patients: BloggerCourse.com  Fact Sheet for Healthcare Providers: SeriousBroker.it  This test is not yet approved or cleared by the [place] FDA and has been authorized for detection and/or diagnosis of SARS-CoV-2 by FDA under an Emergency Use Authorization (EUA). This EUA will remain in effect (meaning this test can be used) for the duration of the COVID-19 declaration under Section 564(b)(1) of the Act, 21 U.S.C. section 360bbb-3(b)(1), unless the authorization is terminated or revoked.     Resp Syncytial Virus by PCR NEGATIVE NEGATIVE Final    Comment: (NOTE)  Fact Sheet for Patients: BloggerCourse.com  Fact Sheet for Healthcare Providers: SeriousBroker.it  This test is not yet approved or cleared by the [place] FDA and has been authorized for detection and/or diagnosis of SARS-CoV-2 by FDA under an Emergency Use Authorization (EUA). This EUA will remain in effect (meaning this test can be used) for the duration of the COVID-19 declaration under Section 564(b)(1) of the Act, 21 U.S.C. section 360bbb-3(b)(1), unless the authorization is terminated or revoked.  Performed at [facility], [address] [place], [place] [address]   Culture, blood (Routine X 2) w Reflex to ID Panel     Status: None (Preliminary result)    Collection Time: 05/02/23  1:14 PM   Specimen: BLOOD  Result Value Ref Range Status   Specimen Description BLOOD BLOOD LEFT HAND  Final   Special Requests AEROBIC BOTTLE ONLY Blood Culture adequate volume  Final   Culture   Final    NO GROWTH 4 DAYS Performed at [facility], [address], [place], [place] [address]    Report Status PENDING  Incomplete  Culture, blood (Routine X 2) w Reflex to ID Panel     Status: None (Preliminary result)   Collection Time: 05/02/23  1:14 PM   Specimen: BLOOD LEFT ARM  Result Value Ref Range Status   Specimen Description BLOOD LEFT ARM  Final   Special Requests AEROBIC BOTTLE ONLY Blood Culture adequate volume  Final   Culture   Final    NO GROWTH 4 DAYS Performed at [facility], [address], [place], [place] [address]    Report Status PENDING  Incomplete  MRSA Next Gen by PCR, Nasal     Status: None   Collection Time: 05/02/23  2:39 PM   Specimen: Nasal Mucosa; Nasal Swab  Result Value Ref Range Status   MRSA by PCR Next Gen NOT DETECTED NOT DETECTED Final    Comment: (NOTE) The GeneXpert MRSA Assay (FDA approved for NASAL specimens only), is one component of a comprehensive MRSA colonization surveillance program. It is not intended to diagnose MRSA infection nor to guide or monitor treatment for MRSA infections. Test performance is not FDA approved in patients less than 56 years old. Performed at [facility], [address], [place], [place] [address]          Radiology Studies: No results found.       Scheduled Meds:  calcium carbonate  1 tablet Oral BID WC   chlordiazePOXIDE  25 mg Oral BH-qamhs   Followed by   Melene Muller ON 05/07/2023] chlordiazePOXIDE  25 mg Oral Daily   Chlorhexidine Gluconate Cloth  6 each Topical Q0600   doxycycline  100 mg Oral Q12H   folic acid  1 mg Oral Daily   furosemide  80 mg Intravenous Q12H   heparin  5,000 Units Subcutaneous Q8H   loratadine  10 mg Oral Daily    multivitamin with minerals  1 tablet Oral Daily   pantoprazole  40 mg Oral BID AC   sodium chloride flush  3 mL Intravenous Q12H   thiamine  100 mg Oral Daily   Vitamin D (Ergocalciferol)  50,000 Units Oral Q7 days   Continuous Infusions:     LOS: 9 days    Time spent: 40 minutes    Ramiro Harvest, MD [facility]   To contact the attending provider between 7A-7P or the covering provider during after hours 7P-7A, please log into the  web site [email or web address] and access using universal [place] password for that web site. If you do not have the password, please call the hospital operator.  05/06/2023, 11:38 AM

## 2023-05-06 NOTE — Progress Notes (Signed)
 Mobility Specialist Progress Note:   05/06/23 0940  Mobility  Activity Ambulated independently in hallway  Level of Assistance Independent  Assistive Device None  Distance Ambulated (ft) 700 ft  Activity Response Tolerated well  Mobility Referral Yes  Mobility visit 1 Mobility  Mobility Specialist Start Time (ACUTE ONLY) 0940  Mobility Specialist Stop Time (ACUTE ONLY) [id number]  Mobility Specialist Time Calculation (min) (ACUTE ONLY) 12 min   Pt agreeable to mobility session. Required no physical assistance throughout. C/o generalized fatigue from HD, otherwise no complaints. Pt back in bed with all needs met.   [name] Mobility Specialist Please contact via SecureChat or  Rehab office at [phone]

## 2023-05-07 LAB — COMPREHENSIVE METABOLIC PANEL
ALT: 192 U/L — ABNORMAL HIGH (ref 0–44)
AST: 41 U/L (ref 15–41)
Albumin: 2.8 g/dL — ABNORMAL LOW (ref 3.5–5.0)
Alkaline Phosphatase: 56 U/L (ref 38–126)
Anion gap: 14 (ref 5–15)
BUN: 39 mg/dL — ABNORMAL HIGH (ref 6–20)
CO2: 26 mmol/L (ref 22–32)
Calcium: 8.8 mg/dL — ABNORMAL LOW (ref 8.9–10.3)
Chloride: 100 mmol/L (ref 98–111)
Creatinine, Ser: 9.98 mg/dL — ABNORMAL HIGH (ref 0.61–1.24)
GFR, Estimated: 7 mL/min — ABNORMAL LOW (ref >60)
Glucose, Bld: 103 mg/dL — ABNORMAL HIGH (ref 70–99)
Potassium: 3.5 mmol/L (ref 3.5–5.1)
Sodium: 140 mmol/L (ref 135–145)
Total Bilirubin: 0.6 mg/dL (ref 0.0–1.2)
Total Protein: 6.6 g/dL (ref 6.5–8.1)

## 2023-05-07 LAB — CBC
HCT: 31.9 % — ABNORMAL LOW (ref 39.0–52.0)
Hemoglobin: 10.5 g/dL — ABNORMAL LOW (ref 13.0–17.0)
MCH: 30.6 pg (ref 26.0–34.0)
MCHC: 32.9 g/dL (ref 30.0–36.0)
MCV: 93 fL (ref 80.0–100.0)
Platelets: 335 10*3/uL (ref 150–400)
RBC: 3.43 MIL/uL — ABNORMAL LOW (ref 4.22–5.81)
RDW: 14 % (ref 11.5–15.5)
WBC: 9.1 10*3/uL (ref 4.0–10.5)
nRBC: 0 % (ref 0.0–0.2)

## 2023-05-07 LAB — CULTURE, BLOOD (ROUTINE X 2)
Culture: NO GROWTH
Culture: NO GROWTH
Special Requests: ADEQUATE
Special Requests: ADEQUATE

## 2023-05-07 LAB — PHOSPHORUS: Phosphorus: 4.2 mg/dL (ref 2.5–4.6)

## 2023-05-07 MED ORDER — MELATONIN 3 MG PO TABS
3.0000 mg | ORAL_TABLET | Freq: Once | ORAL | Status: AC | PRN
  Administered 2023-05-08: 3 mg via ORAL
  Filled 2023-05-07: qty 1

## 2023-05-07 MED ORDER — CHLORDIAZEPOXIDE HCL 25 MG PO CAPS
25.0000 mg | ORAL_CAPSULE | Freq: Four times a day (QID) | ORAL | Status: DC | PRN
  Administered 2023-05-08: 25 mg via ORAL
  Filled 2023-05-07: qty 1

## 2023-05-07 NOTE — Progress Notes (Signed)
 Andrew Page  Assessment/ Plan: Pt is a 28 y.o. yo male  with alcohol abuse presented with rhabdomyolysis, AKI and features of liver injury.   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  Started IV fluid without major urine output and worsening renal labs.    Started HD on 3/12  temporary HD catheter by IR. HD 3/11 and 3/12-  then seemingly starting to make some urine.  However, the amount of urine was unclear and crt rising off of HD-  needed another HD on 3/15 mostly for inc o2 req.     He does not need renal diet-we will remove fluid restriction as his urine output has increased substantially.  I wonder if he is in the polyuric phase of ATN; UOP increasing even more.  Creatinine may have peaked and for the 1st time did not worsen.  Patient is anxious to get the catheter out but I discussed with the mother and the patient; will wait till tomorrow and if the function is improving and we will pull it plus follow as an outpatient.  Fortunately patient is asymptomatic.  Will stop the Lasix as I believe the patient may be in the polyuric phase of ATN.  #Metabolic acidosis: Corrected with dialysis.   #Acute transaminitis, seen by GI thought to be due to rhabdomyolysis. Improved    #Alcohol abuse: Continue vitamins and withdrawal protocol.  Supportive care.dont know if he has a psych diagnosis-  almost seems manic   #Hypokalemia: Adjust potassium bath during HD. On reg diet in terms of potassium   #Acute febrile, acute hypoxic respiratory failure:  Concerning for infection however respiratory viral panel negative.  UF with dialysis fixed in the past.     Subjective:  3610 UOP recorded, on lasix 80 q 12-   back  on room air.  Labs reflective of HD -  he wants to go home but I discussed it with him and would  like to see stabilization of renal function..    Objective Vital signs in last 24 hours: Vitals:   05/07/23 0052 05/07/23 0053 05/07/23 0521 05/07/23 0732  BP: (!) 143/112 (!) 143/112 (!) 136/103 (!) 128/96  Pulse: 87 87 87 84  Resp:  18 18 20   Temp:  (!) 97.5 F (36.4 C) 98.6 F (37 C) 98 F (36.7 C)  TempSrc:  Oral Oral Oral  SpO2: 99% 99% 99% 99%  Weight:      Height:       Weight change:   Intake/Output Summary (Last 24 hours) at 05/07/2023 0741 Last data filed at 05/07/2023 0559 Gross per 24 hour  Intake 650 ml  Output 3610 ml  Net -2960 ml       Labs: RENAL PANEL Recent Labs  Lab 05/01/23 0549 05/01/23 0550 05/02/23 0556 05/02/23 0917 05/03/23 0536 05/03/23 1645 05/04/23 0313 05/04/23 0314 05/05/23 0239 05/06/23 0314 05/07/23 0331  NA  --    < >  --    < > 133* 136  --  135 139 140 140  K  --    < >  --    < > 3.5 3.6  --  3.4* 3.5 3.5 3.5  CL  --    < >  --   --  94* 97*  --  97* 98 102 100  CO2  --    < >  --   --  26 26  --  26 28 29 26   GLUCOSE  --    < >  --   --  109* 129*  --  99 92 135* 103*  BUN  --    < >  --   --  24* 28*  --  31* 22* 34* 39*  CREATININE  --    < >  --   --  7.70* 8.92*  --  10.06* 8.46* 10.21* 9.98*  CALCIUM  --    < >  --   --  7.4* 8.0*  --  8.2* 8.5* 8.7* 8.8*  MG 1.7  --  2.0  --   --   --   --   --  1.9  --   --   PHOS  --    < >  --   --  3.4  --   --  2.9 2.7 2.6 4.2  ALBUMIN 2.7*   < > 2.7*  --  2.2*  2.2*  --  2.4* 2.4* 2.6* 2.6* 2.8*   < > = values in this interval not displayed.    Liver Function Tests: Recent Labs  Lab 05/04/23 0313 05/04/23 0314 05/05/23 0239 05/06/23 0314 05/07/23 0331  AST 60*  --   --  44* 41  ALT 353*  --   --  221* 192*  ALKPHOS 57  --   --  50 56  BILITOT 0.8  --   --  0.8 0.6  PROT 5.9*  --   --  6.2* 6.6  ALBUMIN 2.4*   < > 2.6* 2.6* 2.8*   < > = values in this interval not displayed.   No results for input(s): "LIPASE", "AMYLASE" in the last 168 hours.  No results for  input(s): "AMMONIA" in the last 168 hours. CBC: Recent Labs    04/29/23 1200 04/30/23 0644 05/03/23 0536 05/04/23 0313 05/05/23 0239 05/06/23 0314 05/07/23 0331  HGB  --    < > 10.5* 11.5* 11.1* 10.2* 10.5*  MCV  --    < > 90.9 90.9 90.6 92.9 93.0  VITAMINB12 4,105*  --   --   --   --   --   --   FOLATE >40.0  --   --   --   --   --   --   TIBC 259  --   --   --   --   --   --   IRON 58  --   --   --   --   --   --    < > = values in this interval not displayed.    Cardiac Enzymes: Recent Labs  Lab 05/01/23 0549 05/02/23 0556 05/03/23 0536 05/06/23 0314  CKTOTAL 7,243* 3,392* 1,205* 485*   CBG: Recent Labs  Lab 05/02/23 1434  GLUCAP 106*    Iron Studies:  No results for input(s): "IRON", "TIBC", "TRANSFERRIN", "FERRITIN" in the last 72 hours.  Studies/Results: No results found.   Medications: Infusions:    Scheduled Medications:  calcium carbonate  1 tablet Oral BID WC   chlordiazePOXIDE  25 mg Oral Daily   Chlorhexidine Gluconate Cloth  6 each Topical Q0600   doxycycline  100 mg Oral Q12H   folic acid  1 mg Oral Daily   furosemide  80 mg Intravenous Q12H   heparin  5,000 Units Subcutaneous Q8H   loratadine  10 mg Oral Daily   multivitamin with minerals  1  tablet Oral Daily   pantoprazole  40 mg Oral BID AC   sodium chloride flush  3 mL Intravenous Q12H   thiamine  100 mg Oral Daily   Vitamin D (Ergocalciferol)  50,000 Units Oral Q7 days    have reviewed scheduled and prn medications.  Physical Exam: General: well appearing male Heart:RRR, s1s2 nl Lungs: much clearer Abdomen:soft, Non-tender, non-distended Extremities: less edema  Dialysis Access: Temporary HD catheter placed 3/12  Andrew Page W 05/07/2023,7:41 AM  LOS: 10 days

## 2023-05-07 NOTE — Plan of Care (Signed)

## 2023-05-07 NOTE — Progress Notes (Signed)
 PROGRESS NOTE    Andrew Page  JXB:[id number] DOB: 05-02-1995 DOA: 04/27/2023 PCP: Patient, No Pcp Per    Chief Complaint  Patient presents with   Drug / Alcohol Assessment   Emesis    Brief Narrative:  28 year old with history of anxiety and depression, EtOH and polysubstance use was admitted yesterday with nausea and vomiting after 5 days of alcohol binging. Workup revealed rhabdomyolysis and AKI with CK of 26,000 and creatinine 3.2. Patient was also noted to have elevated transaminases with AST greater than 7000 ALT around 5000 with INR of 1.5 and bili of 1.5. Tylenol level was undetectable however he was started on NAC.  -Patient subsequently developed an uric worsening renal function, seen by nephrology and with no improvement was started on HD on 04/30/2023.  Patient also seen by GI who feel likely elevated transaminitis secondary to rhabdomyolysis.   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 -Creatinine went from 4.5>> 8.16>>> 10.77>>>> 9.87>>>> 8.37>>> 7.70>>> 8.92>>> 10.06>>> 8.46>>> 10.21>>> 9.98. -Renal ultrasound done with diffusely increased renal cortical echogenicity in keeping with changes of underlying medical renal disease. -Patient seen in consultation by nephrology and due to worsening renal function HD  initiated. -Temporary HD catheter placed by IR on 04/30/2023 and patient underwent first hemodialysis per nephrology recommendations on 04/30/2023 which he tolerated. -Patient with worsening shortness of breath, tachypnea, concern for volume overload on 05/01/2023 and patient underwent hemodialysis with a liter of fluid removed however patient is still with worsening shortness of breath, tachypneic, increased O2 requirements and looks volume overloaded on examination on 05/02/2023 and underwent emergent hemodialysis. -Patient given IV Lasix challenge on 05/03/2023, 05/04/2023. -Patient was on Lasix 80 mg IV every 12 hours per nephrology which was discontinued today.  -Patient underwent hemodialysis on 05/04/2023 with 3.5 L of fluid removed. -Patient urine output of approximately 3.6 L over the past 24 hours. -Per nephrology patient likely in the polyuric phase of ATN and hoping renal function is stabilized and likely to trend down. -Nephrology following and appreciate input and recommendations..  2.  Acute respiratory failure with hypoxia secondary to acute diastolic CHF exacerbation secondary to volume overload from acute renal failure -Likely secondary to volume overload secondary to problem #1. -Patient on 05/02/2023 noted to be in significant volume overload despite hemodialysis on 05/01/2023 with 1 L of fluid removed. -Patient also noted to spike a fever as high as 101.8 on 05/02/2023. -Respiratory viral panel done was negative, influenza A and B PCR negative, COVID-19 PCR negative.  Chest x-ray consistent with volume overload. -ABG obtained with a pH of 7.414, pCO2 of 40, pO2 of 72, bicarb of 25. -Urine Legionella antigen pending, urine pneumococcus antigen pending. -Patient transferred to progressive care unit and placed on the BiPAP with transient clinical improvement, PCCM consulted. -Patient given a Lasix challenge IV. -Patient subsequently underwent emergent hemodialysis on 05/02/2023  with 4 L of  fluid removed without clinical improvement. -Patient also placed on broaden antibiotic coverage of vancomycin, cefepime, azithromycin as patient noted to have had a fever. -Patient seen and followed by PCCM on 05/02/2023 and 05/03/2023. -Antibiotic coverage subsequently narrowed back to IV Rocephin and doxycycline and patient completed 7-day course of treatment. -Patient received IV Lasix challenge on 05/03/2023 as well as 05/04/2023.  -Urine output of 3.6 L recorded over the past 24 hours.  -Patient underwent hemodialysis on 05/04/2023 with 3.5 L of fluid removed.  -Clinical improvement.  -Was on Lasix 80 mg IV every 12 hours per nephrology recommendations which has been discontinued today.   -Patient with good urine output and per nephrology likely in the polyuric phase of ATN.   -Renal function seems to be plateauing and hopefully trending down.  -Continue BiPAP as needed. -Nephrology following.  3.  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 -Patient seen in consultation with cardiology who reviewed 2D echo and it was felt MR did not appear severe however was recommending repeat 2D echo versus TEE next week after HD and improvement with fluid overload. -Cardiology recommending outpatient follow-up. -Cardiology was following but has signed off as of 05/04/2023.   4.  Metabolic acidosis secondary to acute renal failure -Was on bicarb drip which has subsequently been discontinued once patient started on hemodialysis.   -Metabolic acidosis resolved with HD.   -Per nephrology.  5.  Hypokalemia/hypophosphatemia -Potassium at 3.5 this morning, phosphorus at 4.2. -Patient currently undergoing hemodialysis during this hospitalization and will defer electrolyte management to nephrology.   6.  Rhabdomyolysis -Felt secondary to alcohol use and laying on the floor. -CK went from 32K>>> 24,888>>14724>>>7243>>> 3392>>> 1205>>> 485. -Initially placed on IV fluids with some  improvement however IV fluids discontinued on 04/30/2023 as patient noted to be volume overloaded and in acute renal failure and subsequently started on hemodialysis. -CK trended down.   -On HD.   -Nephrology following.  7.  Alcohol induced hepatitis/abnormal LFTs -Transaminitis felt likely secondary to alcohol induced in the setting of rhabdomyolysis. -Patient seen in consultation by GI, Dr. Adela Lank who feels likely not a primary liver issue and elevated likely secondary to significant rhabdomyolysis versus alcohol abuse. -No further liver workup planned at this time. -NAC discontinued. -LFTs trending down daily. -Follow.  8.  Hypocalcemia -Corrected calcium now at 9.6 felt likely secondary to vitamin D deficiency. -Patient with no neuromuscular dysfunction. -EKG without QT prolongation. -Intact PTH noted at 187. -Continue Os-Cal.  9.  Alcohol abuse -Was on Ativan withdrawal protocol which has been completed.. -Was on Librium detox protocol which was completed initially.  -Placed back on Librium detox protocol on 05/04/2023. -Librium taper completed. -Place on Librium 25 mg 3 times daily as needed. -Continue thiamine, folic acid, multivitamin. -Alcohol cessation stressed to patient.  10.  Avulsion fracture right ankle -Supportive care, postop shoe per orthopedics.  11.  Vitamin D deficiency -Continue vitamin D 50,000 units weekly. -Will need outpatient follow-up with PCP for repeat vitamin D levels in 3 to 6 months.  12.  Left upper extremity swelling/left upper extremity cellulitis -Patient noted to have some swelling around the left elbow and left forearm with some erythema. -Plain films of the left elbow and left forearm with no acute abnormalities noted. -Clinical improvement. -Status post 5 days IV Rocephin for presumptive cellulitis.    13.  Anemia -Questionable etiology. -Patient with no overt bleeding. -Anemia panel with iron level of 58,  TIBC of 259, folate  greater than 40. -Per mother patient with a bout of hematemesis prior to admission and since then. -Hemoglobin currently stable at 10.5. -Patient with history of alcohol abuse, continue PPI twice daily.      DVT prophylaxis: Heparin Code Status: Full Family Communication: Updated patient and mother at bedside. Disposition: Likely home when clinically improved, renal function improved and cleared by nephrology.   Status is: Inpatient Remains inpatient appropriate because: Severity of illness   Consultants:  Gastroenterology: Dr. Adela Lank 04/28/2023 Nephrology: Dr. Ronalee Belts 04/29/2023 PCCM: Dr. Merrily Pew 05/02/2023 Cardiology: Dr. Shirlee Latch 05/03/2023  Procedures:  Plan films of the left forearm 05/01/2023 Chest x-ray 04/27/2023, 04/28/2023, 05/01/2023, 05/02/2023 Plain films of the left elbow 05/01/2023 Right upper quadrant ultrasound 04/27/2023 Renal ultrasound 04/27/2023 Ultrasound of the liver 04/28/2023 Image guided temporary HD catheter placement per IR: Dr. Elby Showers 04/30/2023 2D echo 05/03/2023 Lower extremity Dopplers 05/03/2023  Antimicrobials:  Anti-infectives (From admission, onward)    Start     Dose/Rate Route Frequency Ordered Stop   05/03/23 1115  cefTRIAXone (ROCEPHIN) 2 g in sodium chloride 0.9 % 100 mL IVPB        2 g 200 mL/hr over 30 Minutes Intravenous Every 24 hours 05/03/23 1018 05/05/23 1201   05/03/23 1115  azithromycin (ZITHROMAX) tablet 500 mg  Status:  Discontinued        500 mg Oral Daily 05/03/23 1018 05/03/23 1019   05/03/23 1115  doxycycline (VIBRA-TABS) tablet 100 mg        100 mg Oral Every 12 hours 05/03/23 1019 05/08/23 0959   05/02/23 2200  ceFEPIme (MAXIPIME) 1 g in sodium chloride 0.9 % 100 mL IVPB  Status:  Discontinued        1 g 200 mL/hr over 30 Minutes Intravenous Every 24 hours 05/02/23 1351 05/02/23 1615   05/02/23 2200  vancomycin (VANCOREADY) IVPB 1500 mg/300 mL        1,500 mg 150 mL/hr over 120 Minutes Intravenous  Once 05/02/23 1353 05/03/23 0021    05/02/23 1800  piperacillin-tazobactam (ZOSYN) IVPB 2.25 g  Status:  Discontinued        2.25 g 100 mL/hr over 30 Minutes Intravenous Every 8 hours 05/02/23 1628 05/03/23 1018   05/02/23 1400  azithromycin (ZITHROMAX) 500 mg in sodium chloride 0.9 % 250 mL IVPB  Status:  Discontinued        500 mg 250 mL/hr over 60 Minutes Intravenous Every 24 hours 05/02/23 1318 05/03/23 1018   05/02/23 1351  vancomycin variable dose per unstable renal function (pharmacist dosing)  Status:  Discontinued         Does not apply See admin instructions 05/02/23 1351 05/03/23 1018   05/01/23 1300  cefTRIAXone (ROCEPHIN) 2 g in sodium chloride 0.9 % 100 mL IVPB  Status:  Discontinued        2 g 200 mL/hr over 30 Minutes Intravenous Every 24 hours 05/01/23 1127 05/02/23 1318         Subjective: Patient laying in bed watching television.  States shortness of breath has improved.  Denies any chest pain.  States he is having good urine output.  Overall feels well.    Objective: Vitals:   05/07/23 0521 05/07/23 0732 05/07/23 1214 05/07/23 1605  BP: (!) 136/103 (!) 128/96 (!) 128/99 122/85  Pulse: 87 84 89 90  Resp: 18 20 20 20   Temp: 98.6 F (37 C) 98 F (36.7 C) 98.1 F (36.7 C) 98.1 F (36.7 C)  TempSrc: Oral  Oral Oral Oral  SpO2: 99% 99% 99% 100%  Weight:      Height:        Intake/Output Summary (Last 24 hours) at 05/07/2023 1653 Last data filed at 05/07/2023 0559 Gross per 24 hour  Intake 650 ml  Output 2710 ml  Net -2060 ml   Filed Weights   05/04/23 0447 05/05/23 0629 05/06/23 0600  Weight: 78 kg 72 kg 70.6 kg    Examination:  General exam: NAD Respiratory system: Scattered crackles.  No wheezes, no rhonchi.  Fair air movement.  Speaking in full sentences.  No use of accessory muscles of respiration.  Cardiovascular system: Regular rate rhythm no murmurs rubs or gallops.  No JVD.  No lower extremity edema.  Gastrointestinal system: Abdomen soft, nontender, nondistended, positive  bowel sounds with no rebound or guarding. Central nervous system: Alert and oriented. No focal neurological deficits. Extremities: Significantly improved left upper extremity swelling around elbow and forearm.  Trace bilateral lower extremity edema.   Skin: Significantly improved left upper extremity erythema, edema and tenderness to palpation. Psychiatry: Judgement and insight appear normal. Mood & affect appropriate.     Data Reviewed: I have personally reviewed following labs and imaging studies  CBC: Recent Labs  Lab 05/03/23 0536 05/04/23 0313 05/05/23 0239 05/06/23 0314 05/07/23 0331  WBC 13.4* 11.7* 9.9 8.4 9.1  NEUTROABS  --   --   --  4.0  --   HGB 10.5* 11.5* 11.1* 10.2* 10.5*  HCT 30.8* 33.8* 32.9* 30.1* 31.9*  MCV 90.9 90.9 90.6 92.9 93.0  PLT 116* 163 217 256 335    Basic Metabolic Panel: Recent Labs  Lab 05/01/23 0549 05/01/23 0550 05/02/23 0556 05/02/23 0917 05/03/23 0536 05/03/23 1645 05/04/23 0314 05/05/23 0239 05/06/23 0314 05/07/23 0331  NA  --    < >  --    < > 133* 136 135 139 140 140  K  --    < >  --    < > 3.5 3.6 3.4* 3.5 3.5 3.5  CL  --    < >  --   --  94* 97* 97* 98 102 100  CO2  --    < >  --   --  26 26 26 28 29 26   GLUCOSE  --    < >  --   --  109* 129* 99 92 135* 103*  BUN  --    < >  --   --  24* 28* 31* 22* 34* 39*  CREATININE  --    < >  --   --  7.70* 8.92* 10.06* 8.46* 10.21* 9.98*  CALCIUM  --    < >  --   --  7.4* 8.0* 8.2* 8.5* 8.7* 8.8*  MG 1.7  --  2.0  --   --   --   --  1.9  --   --   PHOS  --    < >  --   --  3.4  --  2.9 2.7 2.6 4.2   < > = values in this interval not displayed.    GFR: Estimated Creatinine Clearance: 11.3 mL/min (A) (by C-G formula based on SCr of 9.98 mg/dL (H)).  Liver Function Tests: Recent Labs  Lab 05/02/23 0556 05/03/23 0536 05/04/23 0313 05/04/23 0314 05/05/23 0239 05/06/23 0314 05/07/23 0331  AST 142* 68* 60*  --   --  44* 41  ALT 666* 390* 353*  --   --  221* 192*  ALKPHOS 71 59  57  --   --  50 56  BILITOT 1.1 1.0 0.8  --   --  0.8 0.6  PROT 5.7* 5.2* 5.9*  --   --  6.2* 6.6  ALBUMIN 2.7* 2.2*  2.2* 2.4* 2.4* 2.6* 2.6* 2.8*    CBG: Recent Labs  Lab 05/02/23 1434  GLUCAP 106*     Recent Results (from the past 240 hours)  Respiratory (~20 pathogens) panel by PCR     Status: None   Collection Time: 05/02/23  8:27 AM   Specimen: Nasopharyngeal Swab; Respiratory  Result Value Ref Range Status   Adenovirus NOT DETECTED NOT DETECTED Final   Coronavirus 229E NOT DETECTED NOT DETECTED Final    Comment: (NOTE) The Coronavirus on the Respiratory Panel, DOES NOT test for the novel  Coronavirus (2019 nCoV)    Coronavirus HKU1 NOT DETECTED NOT DETECTED Final   Coronavirus NL63 NOT DETECTED NOT DETECTED Final   Coronavirus OC43 NOT DETECTED NOT DETECTED Final   Metapneumovirus NOT DETECTED NOT DETECTED Final   Rhinovirus / Enterovirus NOT DETECTED NOT DETECTED Final   Influenza A NOT DETECTED NOT DETECTED Final   Influenza B NOT DETECTED NOT DETECTED Final   Parainfluenza Virus 1 NOT DETECTED NOT DETECTED Final   Parainfluenza Virus 2 NOT DETECTED NOT DETECTED Final   Parainfluenza Virus 3 NOT DETECTED NOT DETECTED Final   Parainfluenza Virus 4 NOT DETECTED NOT DETECTED Final   Respiratory Syncytial Virus NOT DETECTED NOT DETECTED Final   Bordetella pertussis NOT DETECTED NOT DETECTED Final   Bordetella Parapertussis NOT DETECTED NOT DETECTED Final   Chlamydophila pneumoniae NOT DETECTED NOT DETECTED Final   Mycoplasma pneumoniae NOT DETECTED NOT DETECTED Final    Comment: Performed at [facility], [address], [place], [place] [address]  Resp panel by RT-PCR (RSV, Flu A&B, Covid) Anterior Nasal Swab     Status: None   Collection Time: 05/02/23  8:27 AM   Specimen: Anterior Nasal Swab  Result Value Ref Range Status   SARS Coronavirus 2 by RT PCR NEGATIVE NEGATIVE Final   Influenza A by PCR NEGATIVE NEGATIVE Final   Influenza B by PCR NEGATIVE  NEGATIVE Final    Comment: (NOTE) The Xpert Xpress SARS-CoV-2/FLU/RSV plus assay is intended as an aid in the diagnosis of influenza from Nasopharyngeal swab specimens and should not be used as a sole basis for treatment. Nasal washings and aspirates are unacceptable for Xpert Xpress SARS-CoV-2/FLU/RSV testing.  Fact Sheet for Patients: BloggerCourse.com  Fact Sheet for Healthcare Providers: SeriousBroker.it  This test is not yet approved or cleared by the [place] FDA and has been authorized for detection and/or diagnosis of SARS-CoV-2 by FDA under an Emergency Use Authorization (EUA). This EUA will remain in effect (meaning this test can be used) for the duration of the COVID-19 declaration under Section 564(b)(1) of the Act, 21 U.S.C. section 360bbb-3(b)(1), unless the authorization is terminated or revoked.     Resp Syncytial Virus by PCR NEGATIVE NEGATIVE Final    Comment: (NOTE) Fact Sheet for Patients: BloggerCourse.com  Fact Sheet for Healthcare Providers: SeriousBroker.it  This test is not yet approved or cleared by the [place] FDA and has been authorized for detection and/or diagnosis of SARS-CoV-2 by FDA under an Emergency Use Authorization (EUA). This EUA will remain in effect (meaning this test can be used) for the duration of the COVID-19 declaration under Section 564(b)(1) of the Act, 21 U.S.C. section  360bbb-3(b)(1), unless the authorization is terminated or revoked.  Performed at [facility], [address] [place], [place] [address]   Culture, blood (Routine X 2) w Reflex to ID Panel     Status: None   Collection Time: 05/02/23  1:14 PM   Specimen: BLOOD  Result Value Ref Range Status   Specimen Description BLOOD BLOOD LEFT HAND  Final   Special Requests AEROBIC BOTTLE ONLY Blood Culture adequate volume  Final   Culture   Final    NO  GROWTH 5 DAYS Performed at [facility], [address], [place], [place] [address]    Report Status 05/07/2023 FINAL  Final  Culture, blood (Routine X 2) w Reflex to ID Panel     Status: None   Collection Time: 05/02/23  1:14 PM   Specimen: BLOOD LEFT ARM  Result Value Ref Range Status   Specimen Description BLOOD LEFT ARM  Final   Special Requests AEROBIC BOTTLE ONLY Blood Culture adequate volume  Final   Culture   Final    NO GROWTH 5 DAYS Performed at [facility], [address], [place], [place] [address]    Report Status 05/07/2023 FINAL  Final  MRSA Next Gen by PCR, Nasal     Status: None   Collection Time: 05/02/23  2:39 PM   Specimen: Nasal Mucosa; Nasal Swab  Result Value Ref Range Status   MRSA by PCR Next Gen NOT DETECTED NOT DETECTED Final    Comment: (NOTE) The GeneXpert MRSA Assay (FDA approved for NASAL specimens only), is one component of a comprehensive MRSA colonization surveillance program. It is not intended to diagnose MRSA infection nor to guide or monitor treatment for MRSA infections. Test performance is not FDA approved in patients less than 77 years old. Performed at [facility], [address], [place], [place] [address]          Radiology Studies: No results found.       Scheduled Meds:  calcium carbonate  1 tablet Oral BID WC   Chlorhexidine Gluconate Cloth  6 each Topical Q0600   doxycycline  100 mg Oral Q12H   folic acid  1 mg Oral Daily   heparin  5,000 Units Subcutaneous Q8H   loratadine  10 mg Oral Daily   multivitamin with minerals  1 tablet Oral Daily   pantoprazole  40 mg Oral BID AC   sodium chloride flush  3 mL Intravenous Q12H   thiamine  100 mg Oral Daily   Vitamin D (Ergocalciferol)  50,000 Units Oral Q7 days   Continuous Infusions:     LOS: 10 days    Time spent: 40 minutes    Ramiro Harvest, MD [facility]   To contact the attending provider between 7A-7P or the covering  provider during after hours 7P-7A, please log into the web site [email or web address] and access using universal [place] password for that web site. If you do not have the password, please call the hospital operator.  05/07/2023, 4:53 PM

## 2023-05-07 NOTE — Progress Notes (Signed)
 Mobility Specialist Progress Note:   05/07/23 0945  Mobility  Activity Ambulated independently in hallway  Level of Assistance Independent  Assistive Device None  Distance Ambulated (ft) 700 ft  Activity Response Tolerated well  Mobility Referral Yes  Mobility visit 1 Mobility  Mobility Specialist Start Time (ACUTE ONLY) 0945  Mobility Specialist Stop Time (ACUTE ONLY) 1000  Mobility Specialist Time Calculation (min) (ACUTE ONLY) 15 min   Pt agreeable to mobility session. Required no physical assistance throughout. VSS on RA. Pt back in bed with all needs met.   [name] Mobility Specialist Please contact via SecureChat or  Rehab office at [phone]

## 2023-05-07 NOTE — Progress Notes (Signed)
 CSW met patient at bedside and introduced self. CSW completed substance use consult and provided patient with substance use resources.   Johnnette Gourd, MSW, LCSWA Transitions of Care [phone]

## 2023-05-08 ENCOUNTER — Other Ambulatory Visit (HOSPITAL_COMMUNITY): Payer: Self-pay

## 2023-05-08 LAB — COMPREHENSIVE METABOLIC PANEL
ALT: 146 U/L — ABNORMAL HIGH (ref 0–44)
AST: 32 U/L (ref 15–41)
Albumin: 2.8 g/dL — ABNORMAL LOW (ref 3.5–5.0)
Alkaline Phosphatase: 50 U/L (ref 38–126)
Anion gap: 13 (ref 5–15)
BUN: 44 mg/dL — ABNORMAL HIGH (ref 6–20)
CO2: 23 mmol/L (ref 22–32)
Calcium: 9 mg/dL (ref 8.9–10.3)
Chloride: 103 mmol/L (ref 98–111)
Creatinine, Ser: 9.49 mg/dL — ABNORMAL HIGH (ref 0.61–1.24)
GFR, Estimated: 7 mL/min — ABNORMAL LOW (ref >60)
Glucose, Bld: 133 mg/dL — ABNORMAL HIGH (ref 70–99)
Potassium: 3.6 mmol/L (ref 3.5–5.1)
Sodium: 139 mmol/L (ref 135–145)
Total Bilirubin: 0.6 mg/dL (ref 0.0–1.2)
Total Protein: 6.5 g/dL (ref 6.5–8.1)

## 2023-05-08 LAB — CBC
HCT: 29.8 % — ABNORMAL LOW (ref 39.0–52.0)
Hemoglobin: 9.9 g/dL — ABNORMAL LOW (ref 13.0–17.0)
MCH: 30.7 pg (ref 26.0–34.0)
MCHC: 33.2 g/dL (ref 30.0–36.0)
MCV: 92.3 fL (ref 80.0–100.0)
Platelets: 345 10*3/uL (ref 150–400)
RBC: 3.23 MIL/uL — ABNORMAL LOW (ref 4.22–5.81)
RDW: 14.2 % (ref 11.5–15.5)
WBC: 8 10*3/uL (ref 4.0–10.5)
nRBC: 0 % (ref 0.0–0.2)

## 2023-05-08 LAB — PHOSPHORUS: Phosphorus: 4.2 mg/dL (ref 2.5–4.6)

## 2023-05-08 MED ORDER — PANTOPRAZOLE SODIUM 40 MG PO TBEC: 40.0000 mg | DELAYED_RELEASE_TABLET | Freq: Every day | ORAL | 0 refills | Status: DC

## 2023-05-08 MED ORDER — FOLIC ACID 1 MG PO TABS
1.0000 mg | ORAL_TABLET | Freq: Every day | ORAL | 0 refills | Status: DC
  Filled 2023-05-08: qty 30, 30d supply, fill #0

## 2023-05-08 MED ORDER — VITAMIN B-1 100 MG PO TABS: 100.0000 mg | ORAL_TABLET | Freq: Every day | ORAL | 0 refills | Status: DC

## 2023-05-08 MED ORDER — FOLIC ACID 1 MG PO TABS: 1.0000 mg | ORAL_TABLET | Freq: Every day | ORAL | 0 refills | Status: DC

## 2023-05-08 MED ORDER — THIAMINE HCL 100 MG PO TABS
100.0000 mg | ORAL_TABLET | Freq: Every day | ORAL | 0 refills | Status: DC
  Filled 2023-05-08: qty 30, 30d supply, fill #0

## 2023-05-08 MED ORDER — VITAMIN D (ERGOCALCIFEROL) 1.25 MG (50000 UNIT) PO CAPS
50000.0000 [IU] | ORAL_CAPSULE | ORAL | 0 refills | Status: AC
  Filled 2023-05-08: qty 5, 30d supply, fill #0

## 2023-05-08 MED ORDER — ADULT MULTIVITAMIN W/MINERALS CH: 1.0000 | ORAL_TABLET | Freq: Every day | ORAL | Status: AC

## 2023-05-08 MED ORDER — PANTOPRAZOLE SODIUM 40 MG PO TBEC
40.0000 mg | DELAYED_RELEASE_TABLET | Freq: Every day | ORAL | 0 refills | Status: DC
  Filled 2023-05-08: qty 30, 30d supply, fill #0

## 2023-05-08 MED ORDER — VITAMIN D (ERGOCALCIFEROL) 1.25 MG (50000 UNIT) PO CAPS: 50000.0000 [IU] | ORAL_CAPSULE | ORAL | 0 refills | Status: DC

## 2023-05-08 NOTE — Plan of Care (Signed)

## 2023-05-08 NOTE — TOC Transition Note (Signed)
 Transition of Care [facility]) - Discharge Note   Patient Details  Name: Andrew Page MRN: [id number] Date of Birth: Jun 07, 1995  [facility] CM/SW Contact:  Leone Haven, RN Phone Number: 05/08/2023, 11:20 AM   Clinical Narrative:    For dc today, has no PCP or insurance, he has a follow up apt on [facility] and NCM has assisted with Match Card for meds, 3.00 each.  MD to send meds to [facility] pharmacy.         Patient Goals and CMS Choice            Discharge Placement                       Discharge Plan and Services Additional resources added to the After Visit Summary for                                       Social Drivers of Health (SDOH) Interventions SDOH Screenings   Food Insecurity: No Food Insecurity (04/28/2023)  Housing: Low Risk  (04/28/2023)  Transportation Needs: No Transportation Needs (04/28/2023)  Utilities: Not At Risk (04/28/2023)  Tobacco Use: High Risk (04/28/2023)     Readmission Risk Interventions     No data to display

## 2023-05-08 NOTE — Progress Notes (Signed)
 Andrew Page  Assessment/ Plan: Pt is a 28 y.o. yo male  with alcohol abuse presented with rhabdomyolysis, AKI and features of liver injury.   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  Started IV fluid without major urine output and worsening renal labs.    Started HD on 3/12  temporary HD catheter by IR. HD 3/11 and 3/12 + 3/15 mostly for inc o2 req.     He does not need renal diet-we removed fluid restriction as his urine output increased substantially.  May be in the polyuric phase and now entering the recovery phase of ATN.  Would like to see a bigger drop in creatinine but it is at least lower than yesterday; the patient is asking to have the catheter removed and I was willing as well as he understood that there is a small possibility of having to put it back in.  With his good urine output and stabilization of creatinine I think it is reasonable to remove the catheter.  After removing the suture having the patient's supine, we removed the temporary catheter and applied gentle pressure for 5 minutes with hemostasis easily obtained.    Lasix stopped on 3/18 and the patient still with great urine output consistent with the polyuric phase of ATN.  From the renal standpoint he is stable for discharge and will have the office call him for an appointment for 1 to 2 weeks.  Signing off at this time; please reconsult as needed.   #Metabolic acidosis: Corrected with dialysis.   #Acute transaminitis, seen by GI thought to be due to rhabdomyolysis. Improved    #Alcohol abuse: Continue vitamins and withdrawal protocol.  Supportive care.dont know if he has a psych diagnosis-  almost seems manic   #Hypokalemia: Adjust potassium bath during HD. On reg diet in terms of potassium   #Acute febrile, acute hypoxic  respiratory failure:  Concerning for infection however respiratory viral panel negative.  UF with dialysis fixed in the past.     Subjective:  3610/2250 UOP recorded, off lasix now.  He wants to go home. He denies any nausea vomiting, shortness of breath.  Objective Vital signs in last 24 hours: Vitals:   05/07/23 1920 05/08/23 0020 05/08/23 0345 05/08/23 0503  BP: (!) 127/94 (!) 132/99 127/88   Pulse: 85 92 95   Resp: 17 18 18    Temp: 97.8 F (36.6 C) 97.9 F (36.6 C) 97.8 F (36.6 C)   TempSrc: Oral Oral Oral   SpO2: 94% 99% 98%   Weight:    71.4 kg  Height:       Weight change:   Intake/Output Summary (Last 24 hours) at 05/08/2023 0807 Last data filed at 05/08/2023 0655 Gross per 24 hour  Intake 240 ml  Output 2250 ml  Net -2010 ml       Labs: RENAL PANEL Recent Labs  Lab 05/02/23 0556 05/02/23 0917 05/04/23 0314 05/05/23 0239 05/06/23 0314 05/07/23 0331 05/08/23 0241  NA  --    < > 135 139 140 140 139  K  --    < > 3.4* 3.5 3.5 3.5 3.6  CL  --    < > 97* 98 102 100 103  CO2  --    < > 26 28 29 26 23   GLUCOSE  --    < > 99 92 135* 103* 133*  BUN  --    < > 31* 22* 34* 39* 44*  CREATININE  --    < > 10.06* 8.46* 10.21* 9.98* 9.49*  CALCIUM  --    < > 8.2* 8.5* 8.7* 8.8* 9.0  MG 2.0  --   --  1.9  --   --   --   PHOS  --    < > 2.9 2.7 2.6 4.2 4.2  ALBUMIN 2.7*   < > 2.4* 2.6* 2.6* 2.8* 2.8*   < > = values in this interval not displayed.    Liver Function Tests: Recent Labs  Lab 05/06/23 0314 05/07/23 0331 05/08/23 0241  AST 44* 41 32  ALT 221* 192* 146*  ALKPHOS 50 56 50  BILITOT 0.8 0.6 0.6  PROT 6.2* 6.6 6.5  ALBUMIN 2.6* 2.8* 2.8*   No results for input(s): "LIPASE", "AMYLASE" in the last 168 hours.  No results for input(s): "AMMONIA" in the last 168 hours. CBC: Recent Labs    04/29/23 1200 04/30/23 0644 05/04/23 0313 05/05/23 0239 05/06/23 0314 05/07/23 0331 05/08/23 0241  HGB  --    < > 11.5* 11.1* 10.2* 10.5* 9.9*  MCV  --     < > 90.9 90.6 92.9 93.0 92.3  VITAMINB12 4,105*  --   --   --   --   --   --   FOLATE >40.0  --   --   --   --   --   --   TIBC 259  --   --   --   --   --   --   IRON 58  --   --   --   --   --   --    < > = values in this interval not displayed.    Cardiac Enzymes: Recent Labs  Lab 05/02/23 0556 05/03/23 0536 05/06/23 0314  CKTOTAL 3,392* 1,205* 485*   CBG: Recent Labs  Lab 05/02/23 1434  GLUCAP 106*    Iron Studies:  No results for input(s): "IRON", "TIBC", "TRANSFERRIN", "FERRITIN" in the last 72 hours.  Studies/Results: No results found.   Medications: Infusions:    Scheduled Medications:  calcium carbonate  1 tablet Oral BID WC   Chlorhexidine Gluconate Cloth  6 each Topical Q0600   folic acid  1 mg Oral Daily   heparin  5,000 Units Subcutaneous Q8H   loratadine  10 mg Oral Daily   multivitamin with minerals  1 tablet Oral Daily   pantoprazole  40 mg Oral BID AC   sodium chloride flush  3 mL Intravenous Q12H   thiamine  100 mg Oral Daily   Vitamin D (Ergocalciferol)  50,000 Units Oral Q7 days    have reviewed scheduled and prn medications.  Physical Exam: General: well appearing male Heart:RRR, s1s2 nl Lungs: much clearer Abdomen:soft, Non-tender, non-distended Extremities: less edema  Dialysis Access: Temporary HD catheter placed 3/12  Andrew Page 05/08/2023,8:07 AM  LOS: 11 days

## 2023-05-08 NOTE — Discharge Summary (Signed)
 Physician Discharge Summary  Ules Marsala ZOX:[id number] DOB: 15-Aug-1995 DOA: 04/27/2023  PCP: Patient, No Pcp Per  Admit date: 04/27/2023 Discharge date: 05/08/2023  Admitted From: Home Disposition: Home  Recommendations for Outpatient Follow-up:  Follow up with PCP in 1 week with repeat CBC/BMP Outpatient follow-up with nephrology Follow up in ED if symptoms worsen or new appear   Home Health: No Equipment/Devices: None  Discharge Condition: Stable CODE STATUS: Full Diet recommendation: Regular  Brief/Interim Summary: 28 year old with history of anxiety and depression, EtOH and polysubstance use was admitted for nausea and vomiting after alcohol binge.  Workup revealed rhabdomyolysis with CK of 26,000 and AKI with creatinine of 3.2 along with elevated AST and ALT.  Tylenol level was undetectable but he was treated with NAC.  Patient subsequently developed worsening renal function, nephrology was consulted.  Because of no improvement in renal function, he was started on hemodialysis on 04/30/2023.  Patient was evaluated by GI as well who thought that LFTs elevation was due to rhabdomyolysis.  He also had acute respiratory failure from volume overload requiring BiPAP, IV Lasix and IV antibiotics.  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  Subsequently, his condition has improved.  He has not required any more dialysis since 05/04/2023.  He is making urine and Lasix has already been stopped by nephrology.  His creatinine is still elevated but nephrology has cleared him for discharge with close outpatient follow-up with nephrology in 1 to 2 weeks.  Discharge patient home today.  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  Renal ultrasound did not show hydronephrosis. -Initially treated  with IV fluids but renal function worsened. -Nephrology consulted and patient was started on hemodialysis on 04/30/2023.  Patient received intermittent hemodialysis during the hospitalization. -Subsequently, his condition has improved.  He has not required any more dialysis since 05/04/2023.  He is making urine and Lasix has already been stopped by nephrology.  His creatinine is still elevated but nephrology has cleared him for discharge with close outpatient follow-up with nephrology in 1 to 2 weeks.  Discharge patient home today.  Acute respiratory failure with hypoxia secondary to acute diastolic CHF exacerbation secondary to volume overload from acute renal failure Possible community-acquired pneumonia -Required BiPAP during this hospitalization and was evaluated by pulmonary as well.  He was initially placed on broad-spectrum antibiotics but subsequently changed to IV Rocephin and doxycycline and patient completed a course of antibiotic treatment. -Respiratory status has much improved and is currently on room air.  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  No further workup recommended and NAC has already been discontinued. -LFTs improving.  Outpatient follow-up.  Vitamin D deficiency -Continue supplementation once weekly  Avulsion fracture right ankle -Continue supportive care, postop shoe per orthopedics  Hypokalemia -Resolved  Leukocytosis -Resolved  Normocytic anemia -Questionable cause. hemoglobin currently stable.  Outpatient follow-up -Patient has been on oral PPI during the hospitalization because of concern for a bout of hematemesis prior to admission.  Hemoglobin currently stable.  Continue PPI but switch to  once a day.  Outpatient follow-up with PCP  Left upper extremity swelling with concern for cellulitis -Improved with 5-day course of  Rocephin  Thrombocytopenia -Resolved   Discharge Instructions  Discharge Instructions     Increase activity slowly   Complete by: As directed       Allergies as of 05/08/2023   No Known Allergies      Medication List     TAKE these medications    folic acid 1 MG tablet Commonly known as: FOLVITE Take 1 tablet (1 mg total) by mouth daily. Start taking on: May 09, 2023   multivitamin with minerals Tabs tablet Take 1 tablet by mouth daily.   pantoprazole 40 MG tablet Commonly known as: PROTONIX Take 1 tablet (40 mg total) by mouth daily.   thiamine 100 MG tablet Commonly known as: VITAMIN B1 Take 1 tablet (100 mg total) by mouth daily. Start taking on: May 09, 2023   Vitamin D (Ergocalciferol) 1.25 MG (50000 UNIT) Caps capsule Commonly known as: DRISDOL Take 1 capsule (50,000 Units total) by mouth every 7 (seven) days. Start taking on: May 13, 2023        Follow-up Information     Rise Paganini L, NP Follow up.   Specialty: Cardiology Why: Monday May 20, 2023 Appt at 1:30 PM (30 min) Contact information: [address] [place] [address] [phone]         PCP. Schedule an appointment as soon as possible for a visit in 1 week(s).          Pa, [employer]. Schedule an appointment as soon as possible for a visit in 1 week(s).   Contact information: [address] [place] [address] [phone]         [facility] Follow up on 05/20/2023.   Specialty: Internal Medicine Why: 11 am for hospital follow up  will be seeing [facility] information: [address] [place] [address] [phone]               No Known Allergies  Consultations: GI/nephrology/PCCM/cardiology   Procedures/Studies: VAS [place] LOWER  EXTREMITY VENOUS (DVT) Result Date: 05/04/2023  Lower Venous DVT Study Patient Name:  DAKOTA VANWART  Date of Exam:   05/03/2023 Medical Rec #: [id number]        Accession #:    [id number] Date of Birth: June 07, 1995        Patient Gender: M Patient Age:   27 years Exam Location:  [facility] Procedure:      VAS [place] LOWER EXTREMITY VENOUS (DVT) Referring Phys: Zenia Resides --------------------------------------------------------------------------------  Indications: Edema.  Comparison Study: No prior exam. Performing Technologist: Fernande Bras  Examination Guidelines: A complete evaluation includes B-mode imaging, spectral Doppler, color Doppler, and power Doppler as needed of all accessible portions of each vessel. Bilateral testing is considered an integral part of a complete examination. Limited examinations for reoccurring indications may be performed as noted. The reflux portion of the exam is performed with the patient in reverse Trendelenburg.  +---------+---------------+---------+-----------+----------+--------------+ RIGHT    CompressibilityPhasicitySpontaneityPropertiesThrombus Aging +---------+---------------+---------+-----------+----------+--------------+ CFV      Full           Yes      Yes                                 +---------+---------------+---------+-----------+----------+--------------+ SFJ      Full           Yes      Yes                                 +---------+---------------+---------+-----------+----------+--------------+  FV Prox  Full                                                        +---------+---------------+---------+-----------+----------+--------------+ FV Mid   Full                                                        +---------+---------------+---------+-----------+----------+--------------+ FV DistalFull                                                         +---------+---------------+---------+-----------+----------+--------------+ PFV      Full                                                        +---------+---------------+---------+-----------+----------+--------------+ POP      Full           Yes      Yes                                 +---------+---------------+---------+-----------+----------+--------------+ PTV      Full                                                        +---------+---------------+---------+-----------+----------+--------------+ PERO     Full                                                        +---------+---------------+---------+-----------+----------+--------------+   +---------+---------------+---------+-----------+----------+--------------+ LEFT     CompressibilityPhasicitySpontaneityPropertiesThrombus Aging +---------+---------------+---------+-----------+----------+--------------+ CFV      Full           Yes      Yes                                 +---------+---------------+---------+-----------+----------+--------------+ SFJ      Full           Yes      Yes                                 +---------+---------------+---------+-----------+----------+--------------+ FV Prox  Full                                                        +---------+---------------+---------+-----------+----------+--------------+  FV Mid   Full                                                        +---------+---------------+---------+-----------+----------+--------------+ FV DistalFull                                                        +---------+---------------+---------+-----------+----------+--------------+ PFV      Full                                                        +---------+---------------+---------+-----------+----------+--------------+ POP      Full           Yes      Yes                                  +---------+---------------+---------+-----------+----------+--------------+ PTV      Full                                                        +---------+---------------+---------+-----------+----------+--------------+ PERO     Full                                                        +---------+---------------+---------+-----------+----------+--------------+     Summary: BILATERAL: - No evidence of deep vein thrombosis seen in the lower extremities, bilaterally. -No evidence of popliteal cyst, bilaterally.   *See table(s) above for measurements and observations. Electronically signed by Heath Lark on 05/04/2023 at 2:22:47 PM.    Final    ECHOCARDIOGRAM COMPLETE Result Date: 05/03/2023    ECHOCARDIOGRAM REPORT   Patient Name:   WILIAM CAUTHORN Date of Exam: 05/03/2023 Medical Rec #:  [id number]       Height:       70.0 in Accession #:    [id number]      Weight:       160.9 lb Date of Birth:  April 23, 1995       BSA:          1.903 m Patient Age:    27 years        BP:           128/93 mmHg Patient Gender: M               HR:           97 bpm. Exam Location:  Inpatient Procedure: 2D Echo, Cardiac Doppler and Color Doppler (Both Spectral and Color            Flow Doppler were utilized during procedure). Indications:    I50.40* Unspecified combined systolic (congestive)  and diastolic                 (congestive) heart failure  History:        Patient has no prior history of Echocardiogram examinations.                 Signs/Symptoms:Altered Mental Status. ETOH. Substance abuse.  Sonographer:    Sheralyn Boatman RDCS Referring Phys: [id number] SUDHAM CHAND IMPRESSIONS  1. Left ventricular ejection fraction, by estimation, is 55 to 60%. The left ventricle has normal function. The left ventricle has no regional wall motion abnormalities. The left ventricular internal cavity size was mildly dilated. Indeterminate diastolic filling due to E-A fusion. There is the interventricular septum is flattened in systole and  diastole, consistent with right ventricular pressure and volume overload.  2. Right ventricular systolic function is normal. The right ventricular size is normal. There is moderately elevated pulmonary artery systolic pressure. The estimated right ventricular systolic pressure is 49.1 mmHg.  3. Left atrial size was moderately dilated.  4. 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 No evidence of mitral stenosis.  5. Tricuspid valve regurgitation is moderate.  6. The aortic valve is grossly normal. Aortic valve regurgitation is not visualized. No aortic stenosis is present. Conclusion(s)/Recommendation(s): The mitral valve is abnormal with severe central MR> Consider TEE to further evalaute. FINDINGS  Left Ventricle: Left ventricular ejection fraction, by estimation, is 55 to 60%. The left ventricle has normal function. The left ventricle has no regional wall motion abnormalities. The left ventricular internal cavity size was mildly dilated. There is  no left ventricular hypertrophy. The interventricular septum is flattened in systole and diastole, consistent with right ventricular pressure and volume overload. Indeterminate diastolic filling due to E-A fusion. Right Ventricle: The right ventricular size is normal. No increase in right ventricular wall thickness. Right ventricular systolic function is normal. There is moderately elevated pulmonary artery systolic pressure. The tricuspid regurgitant velocity is 2.92 m/s, and with an assumed right atrial pressure of 15 mmHg, the estimated right ventricular systolic pressure is 49.1 mmHg. Left Atrium: Left atrial size was moderately dilated. Right Atrium: Right atrial size was normal in size. Pericardium: There is no evidence of pericardial effusion. 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 No evidence of mitral valve stenosis. MV peak gradient, 8.2 mmHg. The mean mitral valve  gradient is 3.0 mmHg. Tricuspid Valve: The tricuspid valve is normal in structure. Tricuspid valve regurgitation is moderate . No evidence of tricuspid stenosis. Aortic Valve: The aortic valve is grossly normal. Aortic valve regurgitation is not visualized. No aortic stenosis is present. Aortic valve peak gradient measures 31.8 mmHg. Pulmonic Valve: The pulmonic valve was normal in structure. Pulmonic valve regurgitation is mild. No evidence of pulmonic stenosis. Aorta: The aortic root and ascending aorta are structurally normal, with no evidence of dilitation. IAS/Shunts: There is right bowing of the interatrial septum, suggestive of elevated left atrial pressure. No atrial level shunt detected by color flow Doppler.  LEFT VENTRICLE PLAX 2D LVIDd:         4.80 cm      Diastology LVIDs:         3.50 cm      LV e' medial:    9.36 cm/s LV PW:         1.25 cm      LV E/e' medial:  11.6 LV IVS:        1.00 cm  LV e' lateral:   13.80 cm/s LVOT diam:     2.20 cm      LV E/e' lateral: 7.9 LV SV:         75 LV SV Index:   40 LVOT Area:     3.80 cm  LV Volumes (MOD) LV vol d, MOD A2C: 126.0 ml LV vol d, MOD A4C: 96.7 ml LV vol s, MOD A2C: 56.5 ml LV vol s, MOD A4C: 50.3 ml LV SV MOD A2C:     69.5 ml LV SV MOD A4C:     96.7 ml LV SV MOD BP:      59.4 ml RIGHT VENTRICLE             IVC RV S prime:     14.30 cm/s  IVC diam: 2.40 cm TAPSE (M-mode): 1.9 cm LEFT ATRIUM             Index        RIGHT ATRIUM           Index LA diam:        3.60 cm 1.89 cm/m   RA Area:     15.40 cm LA Vol (A2C):   37.7 ml 19.81 ml/m  RA Volume:   37.10 ml  19.49 ml/m LA Vol (A4C):   49.4 ml 25.93 ml/m LA Biplane Vol: 52.1 ml 27.38 ml/m  AORTIC VALVE AV Area (Vmax): 1.59 cm AV Vmax:        282.00 cm/s AV Peak Grad:   31.8 mmHg LVOT Vmax:      118.00 cm/s LVOT Vmean:     80.700 cm/s LVOT VTI:       0.198 m  AORTA Ao Root diam: 2.60 cm Ao Asc diam:  3.60 cm MITRAL VALVE                TRICUSPID VALVE MV Area (PHT): 5.13 cm     TR Peak grad:    34.1 mmHg MV Area VTI:   3.23 cm     TR Vmax:        292.00 cm/s MV Peak grad:  8.2 mmHg MV Mean grad:  3.0 mmHg     SHUNTS MV Vmax:       1.43 m/s     Systemic VTI:  0.20 m MV Vmean:      76.4 cm/s    Systemic Diam: 2.20 cm MV Decel Time: 148 msec MV E velocity: 109.00 cm/s MV A velocity: 68.50 cm/s MV E/A ratio:  1.59 Arvilla Meres MD Electronically signed by Arvilla Meres MD Signature Date/Time: 05/03/2023/10:41:34 AM    Final    DG Chest Port 1 View Result Date: 05/03/2023 CLINICAL DATA:  Pulmonary edema. EXAM: PORTABLE CHEST 1 VIEW COMPARISON:  May 02, 2023. FINDINGS: The heart size and mediastinal contours are within normal limits. Stable bilateral opacities are noted concerning for pneumonia or edema. Right internal jugular catheter is unchanged. The visualized skeletal structures are unremarkable. IMPRESSION: Stable bilateral lung opacities as noted above. Electronically Signed   By: Lupita Raider M.D.   On: 05/03/2023 10:13   DG CHEST PORT 1 VIEW Result Date: 05/02/2023 CLINICAL DATA:  [id number] Hypoxia [id number] EXAM: PORTABLE CHEST 1 VIEW COMPARISON:  05/01/2023 FINDINGS: Right IJ approach central venous catheter terminating at the level of the right atrium. Heart size is normal. Progressive hazy bilateral perihilar airspace opacities. No pleural effusion or pneumothorax. IMPRESSION: Progressive hazy bilateral perihilar airspace opacities, favoring pulmonary edema over atypical/viral infection.  Electronically Signed   By: Duanne Guess D.O.   On: 05/02/2023 12:43   DG Forearm Left Result Date: 05/01/2023 CLINICAL DATA:  Left arm swelling. EXAM: LEFT FOREARM - 2 VIEW COMPARISON:  None Available. FINDINGS: There is no evidence of fracture or other focal bone lesions. Soft tissues are unremarkable. IMPRESSION: Negative. Electronically Signed   By: Lupita Raider M.D.   On: 05/01/2023 14:24   DG ELBOW COMPLETE LEFT (3+VIEW) Result Date: 05/01/2023 CLINICAL DATA:  Left elbow pain and  swelling. EXAM: LEFT ELBOW - COMPLETE 3+ VIEW COMPARISON:  None Available. FINDINGS: There is no evidence of fracture, dislocation, or joint effusion. There is no evidence of arthropathy or other focal bone abnormality. Soft tissues are unremarkable. IMPRESSION: Negative. Electronically Signed   By: Lupita Raider M.D.   On: 05/01/2023 14:23   DG CHEST PORT 1 VIEW Result Date: 05/01/2023 CLINICAL DATA:  Shortness of breath. EXAM: PORTABLE CHEST 1 VIEW COMPARISON:  April 28, 2023. FINDINGS: The heart size and mediastinal contours are within normal limits. Right internal jugular catheter is noted with tip in expected position of right atrium. Mild central pulmonary vascular congestion is noted with possible mild bibasilar edema or atelectasis. The visualized skeletal structures are unremarkable. IMPRESSION: Mild central pulmonary vascular congestion with possible mild bibasilar edema or atelectasis. Electronically Signed   By: Lupita Raider M.D.   On: 05/01/2023 14:21   IR Fluoro Guide CV Line Right Result Date: 04/30/2023 INDICATION: 28 year old male with history of rhabdomyolysis requiring central venous access for hemodialysis. EXAM: NON-TUNNELED CENTRAL VENOUS HEMODIALYSIS CATHETER PLACEMENT WITH ULTRASOUND AND FLUOROSCOPIC GUIDANCE COMPARISON:  None Available. MEDICATIONS: None FLUOROSCOPY TIME:  One mGy COMPLICATIONS: None immediate. PROCEDURE: Informed written consent was obtained from the patient after a discussion of the risks, benefits, and alternatives to treatment. Questions regarding the procedure were encouraged and answered. The right neck and chest were prepped with chlorhexidine in a sterile fashion, and a sterile drape was applied covering the operative field. Maximum barrier sterile technique with sterile gowns and gloves were used for the procedure. A timeout was performed prior to the initiation of the procedure. After the overlying soft tissues were anesthetized, a small venotomy incision  was created and a micropuncture kit was utilized to access the internal jugular vein. Real-time ultrasound guidance was utilized for vascular access including the acquisition of a permanent ultrasound image documenting patency of the accessed vessel. The microwire was utilized to measure appropriate catheter length. A guidewire was advanced to the level of the IVC. Under fluoroscopic guidance, the venotomy was serially dilated, ultimately allowing placement of a 20 cm temporary Trialysis catheter with tip ultimately terminating within the superior aspect of the right atrium. Final catheter positioning was confirmed and documented with a spot radiographic image. The catheter aspirates and flushes normally. The catheter was flushed with appropriate volume heparin dwells. The catheter exit site was secured with a 0 silk retention suture. A dressing was placed. The patient tolerated the procedure well without immediate post procedural complication. IMPRESSION: Successful placement of a right internal jugular approach 20 cm temporary dialysis catheter with tip terminating with in the superior aspect of the right atrium. The catheter is ready for immediate use. PLAN: This catheter may be converted to a tunneled dialysis catheter at a later date as indicated. Marliss Coots, MD [facility] Radiology Electronically Signed   By: Marliss Coots M.D.   On: 04/30/2023 16:28   IR US Guide Vasc  Access Right Result Date: 04/30/2023 INDICATION: 28 year old male with history of rhabdomyolysis requiring central venous access for hemodialysis. EXAM: NON-TUNNELED CENTRAL VENOUS HEMODIALYSIS CATHETER PLACEMENT WITH ULTRASOUND AND FLUOROSCOPIC GUIDANCE COMPARISON:  None Available. MEDICATIONS: None FLUOROSCOPY TIME:  One mGy COMPLICATIONS: None immediate. PROCEDURE: Informed written consent was obtained from the patient after a discussion of the risks, benefits, and alternatives to  treatment. Questions regarding the procedure were encouraged and answered. The right neck and chest were prepped with chlorhexidine in a sterile fashion, and a sterile drape was applied covering the operative field. Maximum barrier sterile technique with sterile gowns and gloves were used for the procedure. A timeout was performed prior to the initiation of the procedure. After the overlying soft tissues were anesthetized, a small venotomy incision was created and a micropuncture kit was utilized to access the internal jugular vein. Real-time ultrasound guidance was utilized for vascular access including the acquisition of a permanent ultrasound image documenting patency of the accessed vessel. The microwire was utilized to measure appropriate catheter length. A guidewire was advanced to the level of the IVC. Under fluoroscopic guidance, the venotomy was serially dilated, ultimately allowing placement of a 20 cm temporary Trialysis catheter with tip ultimately terminating within the superior aspect of the right atrium. Final catheter positioning was confirmed and documented with a spot radiographic image. The catheter aspirates and flushes normally. The catheter was flushed with appropriate volume heparin dwells. The catheter exit site was secured with a 0 silk retention suture. A dressing was placed. The patient tolerated the procedure well without immediate post procedural complication. IMPRESSION: Successful placement of a right internal jugular approach 20 cm temporary dialysis catheter with tip terminating with in the superior aspect of the right atrium. The catheter is ready for immediate use. PLAN: This catheter may be converted to a tunneled dialysis catheter at a later date as indicated. Marliss Coots, MD [facility] Radiology Electronically Signed   By: Marliss Coots M.D.   On: 04/30/2023 16:28   DG CHEST PORT 1 VIEW Result Date: 04/28/2023 CLINICAL DATA:   Dyspnea. EXAM: PORTABLE CHEST 1 VIEW COMPARISON:  11/12/2020 FINDINGS: The cardiomediastinal contours are normal. The lungs are clear. Pulmonary vasculature is normal. No consolidation, pleural effusion, or pneumothorax. No acute osseous abnormalities are seen. IMPRESSION: No active disease. Electronically Signed   By: Narda Rutherford M.D.   On: 04/28/2023 20:36   US LIVER DOPPLER Result Date: 04/28/2023 CLINICAL DATA:  Elevated liver function tests EXAM: DUPLEX ULTRASOUND OF LIVER TECHNIQUE: Color and duplex Doppler ultrasound was performed to evaluate the hepatic in-flow and out-flow vessels. COMPARISON:  None Available. FINDINGS: Liver: Normal parenchymal echogenicity. Normal hepatic contour without nodularity. No focal lesion, mass or intrahepatic biliary ductal dilatation. Main Portal Vein size: 1.4 cm Portal Vein Velocities Main Prox:  30 cm/sec Main Mid: 21 cm/sec Main Dist:  29 cm/sec Right: 9 cm/sec Left: 17 cm/sec Hepatic Vein Velocities Right:  13 cm/sec Middle:  38 cm/sec Left:  20 cm/sec IVC: Present and patent with normal respiratory phasicity. Hepatic Artery Velocity:  50 cm/sec Splenic Vein Velocity:  15 cm/sec Spleen: 9.6 cm x 8.7 cm x 3.4 cm with a total volume of 150 cm^3 (411 cm^3 is upper limit normal) Portal Vein Occlusion/Thrombus: No Splenic Vein Occlusion/Thrombus: No Ascites: None Varices: None IMPRESSION: 1. Normal hepatic Doppler examination. Electronically Signed   By: Helyn Numbers M.D.   On: 04/28/2023 01:45   US Abdomen Limited RUQ (LIVER/GB) Result Date: 04/27/2023 CLINICAL  DATA:  Elevated liver function tests EXAM: ULTRASOUND ABDOMEN LIMITED RIGHT UPPER QUADRANT COMPARISON:  None Available. FINDINGS: Gallbladder: No intraluminal stones or sludge is identified. The gallbladder is not overtly distended. There is mild gallbladder wall thickening with gallbladder wall measuring 4-6 mm in thickness. There is pericholecystic fluid present best appreciated within the gallbladder  fossa. The sonographic Eulah Pont sign is reportedly negative. Common bile duct: Diameter: 4 mm in mid diameter Liver: No focal lesion identified. Within normal limits in parenchymal echogenicity. Portal vein is patent on color Doppler imaging with normal direction of blood flow towards the liver. Other: Visualized mid body of the pancreas is unremarkable. IMPRESSION: 1. Mild gallbladder wall thickening, possibly related to nondistention of the gallbladder, and pericholecystic fluid. These findings are nonspecific and while these may be seen in the setting of acalculous cholecystitis, additional considerations such as inflammatory conditions of the liver or biliary tree or cardiogenic failure could appear similarly. If there is clinical concern for acalculus cholecystitis, hepatobiliary scintigraphy may be helpful for further evaluation. Electronically Signed   By: Helyn Numbers M.D.   On: 04/27/2023 23:18   US RENAL Result Date: 04/27/2023 CLINICAL DATA:  Acute renal insufficiency EXAM: RENAL / URINARY TRACT ULTRASOUND COMPLETE COMPARISON:  None Available. FINDINGS: Right Kidney: Renal measurements: 10.5 x 6.3 x 5.5 cm = volume: 187 mL. Renal cortical echogenicity is diffusely increased in keeping with changes of underlying medical renal disease. Cortical thickness is preserved. No intrarenal masses or calcifications. No hydronephrosis. No perinephric fluid collections are seen. Left Kidney: Renal measurements: 10.9 x 5.5 x 5.4 cm = volume: 171 mL. Renal cortical echogenicity is diffusely increased in keeping with changes of underlying medical renal disease. Cortical thickness is preserved. No intrarenal masses or calcifications. No hydronephrosis. No perinephric fluid collections are seen. Bladder: Appears normal for degree of bladder distention. Other: None. IMPRESSION: 1. Diffusely increased renal cortical echogenicity in keeping with changes of underlying medical renal disease. Electronically Signed   By: Helyn Numbers M.D.   On: 04/27/2023 23:13   DG Foot Complete Right Result Date: 04/27/2023 CLINICAL DATA:  Right foot pain. EXAM: RIGHT FOOT COMPLETE - 3+ VIEW COMPARISON:  April 24, 2014 FINDINGS: There is a small area of cortical irregularity seen along the dorsal aspect of the talus. This is of indeterminate age and represents a new finding when compared to the prior study. There is no evidence of dislocation. There is no evidence of arthropathy or other focal bone abnormality. Soft tissues are unremarkable. IMPRESSION: Findings along the dorsal aspect of the right talus which may represent a small avulsion fracture of indeterminate age. Correlation with physical examination is recommended to determine the presence of point tenderness. Electronically Signed   By: Aram Candela M.D.   On: 04/27/2023 19:43      Subjective: Patient seen and examined at bedside.  Wants to go home today.  Denies worsening fever, shortness of breath, chest pain.  Discharge Exam: Vitals:   05/08/23 0345 05/08/23 0834  BP: 127/88 126/88  Pulse: 95 91  Resp: 18 18  Temp: 97.8 F (36.6 C) 98 F (36.7 C)  SpO2: 98% 99%    General: Pt is alert, awake, not in acute distress.  On room air. Cardiovascular: rate controlled, S1/S2 + Respiratory: bilateral decreased breath sounds at bases Abdominal: Soft, NT, ND, bowel sounds + Extremities: no edema, no cyanosis    The results of significant diagnostics from this hospitalization (including imaging, microbiology, ancillary and laboratory) are listed below  for reference.     Microbiology: Recent Results (from the past 240 hours)  Respiratory (~20 pathogens) panel by PCR     Status: None   Collection Time: 05/02/23  8:27 AM   Specimen: Nasopharyngeal Swab; Respiratory  Result Value Ref Range Status   Adenovirus NOT DETECTED NOT DETECTED Final   Coronavirus 229E NOT DETECTED NOT DETECTED Final    Comment: (NOTE) The Coronavirus on the Respiratory Panel, DOES NOT  test for the novel  Coronavirus (2019 nCoV)    Coronavirus HKU1 NOT DETECTED NOT DETECTED Final   Coronavirus NL63 NOT DETECTED NOT DETECTED Final   Coronavirus OC43 NOT DETECTED NOT DETECTED Final   Metapneumovirus NOT DETECTED NOT DETECTED Final   Rhinovirus / Enterovirus NOT DETECTED NOT DETECTED Final   Influenza A NOT DETECTED NOT DETECTED Final   Influenza B NOT DETECTED NOT DETECTED Final   Parainfluenza Virus 1 NOT DETECTED NOT DETECTED Final   Parainfluenza Virus 2 NOT DETECTED NOT DETECTED Final   Parainfluenza Virus 3 NOT DETECTED NOT DETECTED Final   Parainfluenza Virus 4 NOT DETECTED NOT DETECTED Final   Respiratory Syncytial Virus NOT DETECTED NOT DETECTED Final   Bordetella pertussis NOT DETECTED NOT DETECTED Final   Bordetella Parapertussis NOT DETECTED NOT DETECTED Final   Chlamydophila pneumoniae NOT DETECTED NOT DETECTED Final   Mycoplasma pneumoniae NOT DETECTED NOT DETECTED Final    Comment: Performed at [facility], [address], [place], [place] [address]  Resp panel by RT-PCR (RSV, Flu A&B, Covid) Anterior Nasal Swab     Status: None   Collection Time: 05/02/23  8:27 AM   Specimen: Anterior Nasal Swab  Result Value Ref Range Status   SARS Coronavirus 2 by RT PCR NEGATIVE NEGATIVE Final   Influenza A by PCR NEGATIVE NEGATIVE Final   Influenza B by PCR NEGATIVE NEGATIVE Final    Comment: (NOTE) The Xpert Xpress SARS-CoV-2/FLU/RSV plus assay is intended as an aid in the diagnosis of influenza from Nasopharyngeal swab specimens and should not be used as a sole basis for treatment. Nasal washings and aspirates are unacceptable for Xpert Xpress SARS-CoV-2/FLU/RSV testing.  Fact Sheet for Patients: BloggerCourse.com  Fact Sheet for Healthcare Providers: SeriousBroker.it  This test is not yet approved or cleared by the [place] FDA and has been authorized for detection and/or diagnosis of  SARS-CoV-2 by FDA under an Emergency Use Authorization (EUA). This EUA will remain in effect (meaning this test can be used) for the duration of the COVID-19 declaration under Section 564(b)(1) of the Act, 21 U.S.C. section 360bbb-3(b)(1), unless the authorization is terminated or revoked.     Resp Syncytial Virus by PCR NEGATIVE NEGATIVE Final    Comment: (NOTE) Fact Sheet for Patients: BloggerCourse.com  Fact Sheet for Healthcare Providers: SeriousBroker.it  This test is not yet approved or cleared by the [place] FDA and has been authorized for detection and/or diagnosis of SARS-CoV-2 by FDA under an Emergency Use Authorization (EUA). This EUA will remain in effect (meaning this test can be used) for the duration of the COVID-19 declaration under Section 564(b)(1) of the Act, 21 U.S.C. section 360bbb-3(b)(1), unless the authorization is terminated or revoked.  Performed at [facility], [address], [place], [place] [address]   Culture, blood (Routine X 2) w Reflex to ID Panel     Status: None   Collection Time: 05/02/23  1:14 PM   Specimen: BLOOD  Result Value Ref Range Status   Specimen Description  BLOOD BLOOD LEFT HAND  Final   Special Requests AEROBIC BOTTLE ONLY Blood Culture adequate volume  Final   Culture   Final    NO GROWTH 5 DAYS Performed at [facility], [address], [place], [place] [address]    Report Status 05/07/2023 FINAL  Final  Culture, blood (Routine X 2) w Reflex to ID Panel     Status: None   Collection Time: 05/02/23  1:14 PM   Specimen: BLOOD LEFT ARM  Result Value Ref Range Status   Specimen Description BLOOD LEFT ARM  Final   Special Requests AEROBIC BOTTLE ONLY Blood Culture adequate volume  Final   Culture   Final    NO GROWTH 5 DAYS Performed at [facility], [address], [place], [place] [address]    Report Status 05/07/2023 FINAL  Final  MRSA Next Gen by  PCR, Nasal     Status: None   Collection Time: 05/02/23  2:39 PM   Specimen: Nasal Mucosa; Nasal Swab  Result Value Ref Range Status   MRSA by PCR Next Gen NOT DETECTED NOT DETECTED Final    Comment: (NOTE) The GeneXpert MRSA Assay (FDA approved for NASAL specimens only), is one component of a comprehensive MRSA colonization surveillance program. It is not intended to diagnose MRSA infection nor to guide or monitor treatment for MRSA infections. Test performance is not FDA approved in patients less than 62 years old. Performed at [facility], [address], [place], [place] [address]      Labs: BNP (last 3 results) No results for input(s): "BNP" in the last 8760 hours. Basic Metabolic Panel: Recent Labs  Lab 05/02/23 0556 05/02/23 0917 05/04/23 0314 05/05/23 0239 05/06/23 0314 05/07/23 0331 05/08/23 0241  NA  --    < > 135 139 140 140 139  K  --    < > 3.4* 3.5 3.5 3.5 3.6  CL  --    < > 97* 98 102 100 103  CO2  --    < > 26 28 29 26 23   GLUCOSE  --    < > 99 92 135* 103* 133*  BUN  --    < > 31* 22* 34* 39* 44*  CREATININE  --    < > 10.06* 8.46* 10.21* 9.98* 9.49*  CALCIUM  --    < > 8.2* 8.5* 8.7* 8.8* 9.0  MG 2.0  --   --  1.9  --   --   --   PHOS  --    < > 2.9 2.7 2.6 4.2 4.2   < > = values in this interval not displayed.   Liver Function Tests: Recent Labs  Lab 05/03/23 0536 05/04/23 0313 05/04/23 0314 05/05/23 0239 05/06/23 0314 05/07/23 0331 05/08/23 0241  AST 68* 60*  --   --  44* 41 32  ALT 390* 353*  --   --  221* 192* 146*  ALKPHOS 59 57  --   --  50 56 50  BILITOT 1.0 0.8  --   --  0.8 0.6 0.6  PROT 5.2* 5.9*  --   --  6.2* 6.6 6.5  ALBUMIN 2.2*  2.2* 2.4* 2.4* 2.6* 2.6* 2.8* 2.8*   No results for input(s): "LIPASE", "AMYLASE" in the last 168 hours. No results for input(s): "AMMONIA" in the last 168 hours. CBC: Recent Labs  Lab 05/04/23 0313 05/05/23 0239 05/06/23 0314 05/07/23 0331 05/08/23 0241  WBC 11.7*  9.9 8.4 9.1 8.0   NEUTROABS  --   --  4.0  --   --   HGB 11.5* 11.1* 10.2* 10.5* 9.9*  HCT 33.8* 32.9* 30.1* 31.9* 29.8*  MCV 90.9 90.6 92.9 93.0 92.3  PLT 163 217 256 335 345   Cardiac Enzymes: Recent Labs  Lab 05/02/23 0556 05/03/23 0536 05/06/23 0314  CKTOTAL 3,392* 1,205* 485*   BNP: Invalid input(s): "POCBNP" CBG: Recent Labs  Lab 05/02/23 1434  GLUCAP 106*   D-Dimer No results for input(s): "DDIMER" in the last 72 hours. Hgb A1c No results for input(s): "HGBA1C" in the last 72 hours. Lipid Profile No results for input(s): "CHOL", "HDL", "LDLCALC", "TRIG", "CHOLHDL", "LDLDIRECT" in the last 72 hours. Thyroid function studies No results for input(s): "TSH", "T4TOTAL", "T3FREE", "THYROIDAB" in the last 72 hours.  Invalid input(s): "FREET3" Anemia work up No results for input(s): "VITAMINB12", "FOLATE", "FERRITIN", "TIBC", "IRON", "RETICCTPCT" in the last 72 hours. Urinalysis    Component Value Date/Time   COLORURINE AMBER (A) 04/27/2023 2322   APPEARANCEUR CLOUDY (A) 04/27/2023 2322   LABSPEC 1.011 04/27/2023 2322   PHURINE 5.0 04/27/2023 2322   GLUCOSEU 50 (A) 04/27/2023 2322   HGBUR LARGE (A) 04/27/2023 2322   BILIRUBINUR NEGATIVE 04/27/2023 2322   KETONESUR NEGATIVE 04/27/2023 2322   PROTEINUR 100 (A) 04/27/2023 2322   UROBILINOGEN 1.0 06/06/2014 2329   NITRITE NEGATIVE 04/27/2023 2322   LEUKOCYTESUR NEGATIVE 04/27/2023 2322   Sepsis Labs Recent Labs  Lab 05/05/23 0239 05/06/23 0314 05/07/23 0331 05/08/23 0241  WBC 9.9 8.4 9.1 8.0   Microbiology Recent Results (from the past 240 hours)  Respiratory (~20 pathogens) panel by PCR     Status: None   Collection Time: 05/02/23  8:27 AM   Specimen: Nasopharyngeal Swab; Respiratory  Result Value Ref Range Status   Adenovirus NOT DETECTED NOT DETECTED Final   Coronavirus 229E NOT DETECTED NOT DETECTED Final    Comment: (NOTE) The Coronavirus on the Respiratory Panel, DOES NOT test for the novel  Coronavirus (2019  nCoV)    Coronavirus HKU1 NOT DETECTED NOT DETECTED Final   Coronavirus NL63 NOT DETECTED NOT DETECTED Final   Coronavirus OC43 NOT DETECTED NOT DETECTED Final   Metapneumovirus NOT DETECTED NOT DETECTED Final   Rhinovirus / Enterovirus NOT DETECTED NOT DETECTED Final   Influenza A NOT DETECTED NOT DETECTED Final   Influenza B NOT DETECTED NOT DETECTED Final   Parainfluenza Virus 1 NOT DETECTED NOT DETECTED Final   Parainfluenza Virus 2 NOT DETECTED NOT DETECTED Final   Parainfluenza Virus 3 NOT DETECTED NOT DETECTED Final   Parainfluenza Virus 4 NOT DETECTED NOT DETECTED Final   Respiratory Syncytial Virus NOT DETECTED NOT DETECTED Final   Bordetella pertussis NOT DETECTED NOT DETECTED Final   Bordetella Parapertussis NOT DETECTED NOT DETECTED Final   Chlamydophila pneumoniae NOT DETECTED NOT DETECTED Final   Mycoplasma pneumoniae NOT DETECTED NOT DETECTED Final    Comment: Performed at [facility], [address], [place], [place] [address]  Resp panel by RT-PCR (RSV, Flu A&B, Covid) Anterior Nasal Swab     Status: None   Collection Time: 05/02/23  8:27 AM   Specimen: Anterior Nasal Swab  Result Value Ref Range Status   SARS Coronavirus 2 by RT PCR NEGATIVE NEGATIVE Final   Influenza A by PCR NEGATIVE NEGATIVE Final   Influenza B by PCR NEGATIVE NEGATIVE Final    Comment: (NOTE) The Xpert Xpress SARS-CoV-2/FLU/RSV plus assay is intended as an  aid in the diagnosis of influenza from Nasopharyngeal swab specimens and should not be used as a sole basis for treatment. Nasal washings and aspirates are unacceptable for Xpert Xpress SARS-CoV-2/FLU/RSV testing.  Fact Sheet for Patients: BloggerCourse.com  Fact Sheet for Healthcare Providers: SeriousBroker.it  This test is not yet approved or cleared by the [place] FDA and has been authorized for detection and/or diagnosis of SARS-CoV-2 by FDA under an Emergency Use  Authorization (EUA). This EUA will remain in effect (meaning this test can be used) for the duration of the COVID-19 declaration under Section 564(b)(1) of the Act, 21 U.S.C. section 360bbb-3(b)(1), unless the authorization is terminated or revoked.     Resp Syncytial Virus by PCR NEGATIVE NEGATIVE Final    Comment: (NOTE) Fact Sheet for Patients: BloggerCourse.com  Fact Sheet for Healthcare Providers: SeriousBroker.it  This test is not yet approved or cleared by the [place] FDA and has been authorized for detection and/or diagnosis of SARS-CoV-2 by FDA under an Emergency Use Authorization (EUA). This EUA will remain in effect (meaning this test can be used) for the duration of the COVID-19 declaration under Section 564(b)(1) of the Act, 21 U.S.C. section 360bbb-3(b)(1), unless the authorization is terminated or revoked.  Performed at [facility], [address] [place], [place] [address]   Culture, blood (Routine X 2) w Reflex to ID Panel     Status: None   Collection Time: 05/02/23  1:14 PM   Specimen: BLOOD  Result Value Ref Range Status   Specimen Description BLOOD BLOOD LEFT HAND  Final   Special Requests AEROBIC BOTTLE ONLY Blood Culture adequate volume  Final   Culture   Final    NO GROWTH 5 DAYS Performed at [facility], [address] [place], [place] [address]    Report Status 05/07/2023 FINAL  Final  Culture, blood (Routine X 2) w Reflex to ID Panel     Status: None   Collection Time: 05/02/23  1:14 PM   Specimen: BLOOD LEFT ARM  Result Value Ref Range Status   Specimen Description BLOOD LEFT ARM  Final   Special Requests AEROBIC BOTTLE ONLY Blood Culture adequate volume  Final   Culture   Final    NO GROWTH 5 DAYS Performed at [facility], [address] [place], [place] [address]    Report Status 05/07/2023 FINAL  Final  MRSA Next Gen by PCR, Nasal     Status: None   Collection  Time: 05/02/23  2:39 PM   Specimen: Nasal Mucosa; Nasal Swab  Result Value Ref Range Status   MRSA by PCR Next Gen NOT DETECTED NOT DETECTED Final    Comment: (NOTE) The GeneXpert MRSA Assay (FDA approved for NASAL specimens only), is one component of a comprehensive MRSA colonization surveillance program. It is not intended to diagnose MRSA infection nor to guide or monitor treatment for MRSA infections. Test performance is not FDA approved in patients less than 69 years old. Performed at [facility], [address] [place], [place] [address]      Time coordinating discharge: 35 minutes  SIGNED:   Glade Lloyd, MD  [facility] 05/08/2023, 12:53 PM

## 2023-05-08 NOTE — Progress Notes (Addendum)
 Discharge instructions reviewed with pt and his mother. Pt verbalizes understanding of instructions and follow up appointments, and knows to call [facility] to make this follow appointment.  Copy of instructions given to pt. [facility] is filling his scripts and will be picked up on the way out for discharge.  Pt will be d/c'd via wheelchair with belongings, with his mother and will be escorted by hospital volunteer.   Rainna Nearhood,RN SWOT

## 2023-05-14 DIAGNOSIS — M6282 Rhabdomyolysis: Secondary | ICD-10-CM | POA: Diagnosis not present

## 2023-05-14 DIAGNOSIS — I340 Nonrheumatic mitral (valve) insufficiency: Secondary | ICD-10-CM | POA: Diagnosis not present

## 2023-05-14 DIAGNOSIS — I272 Pulmonary hypertension, unspecified: Secondary | ICD-10-CM | POA: Diagnosis not present

## 2023-05-14 DIAGNOSIS — N179 Acute kidney failure, unspecified: Secondary | ICD-10-CM | POA: Diagnosis not present

## 2023-05-14 DIAGNOSIS — F1014 Alcohol abuse with alcohol-induced mood disorder: Secondary | ICD-10-CM | POA: Diagnosis not present

## 2023-05-17 ENCOUNTER — Encounter (HOSPITAL_COMMUNITY): Payer: Self-pay

## 2023-05-17 DIAGNOSIS — N179 Acute kidney failure, unspecified: Secondary | ICD-10-CM | POA: Diagnosis not present

## 2023-05-20 ENCOUNTER — Encounter: Payer: Self-pay | Admitting: Emergency Medicine

## 2023-05-20 ENCOUNTER — Ambulatory Visit: Payer: Self-pay | Admitting: Nurse Practitioner

## 2023-05-20 ENCOUNTER — Ambulatory Visit: Payer: MEDICAID | Attending: Emergency Medicine | Admitting: Emergency Medicine

## 2023-05-20 VITALS — BP 102/80 | HR 107 | Ht 69.5 in | Wt 151.0 lb

## 2023-05-20 DIAGNOSIS — I5031 Acute diastolic (congestive) heart failure: Secondary | ICD-10-CM | POA: Diagnosis not present

## 2023-05-20 DIAGNOSIS — I340 Nonrheumatic mitral (valve) insufficiency: Secondary | ICD-10-CM

## 2023-05-20 DIAGNOSIS — I341 Nonrheumatic mitral (valve) prolapse: Secondary | ICD-10-CM | POA: Diagnosis not present

## 2023-05-20 DIAGNOSIS — N179 Acute kidney failure, unspecified: Secondary | ICD-10-CM | POA: Diagnosis not present

## 2023-05-20 DIAGNOSIS — I361 Nonrheumatic tricuspid (valve) insufficiency: Secondary | ICD-10-CM

## 2023-05-20 NOTE — Progress Notes (Signed)
 Cardiology Office Note:    Date:  05/20/2023  ID:  Andrew Page, DOB 1995/10/15, MRN [id number] PCP: Patient, No Pcp Per  [facility] Providers Cardiologist:  Maisie Fus, MD       Patient Profile:      Chief Complaint: Hospital follow-up History of Present Illness:  Andrew Page is a 28 y.o. male with visit-pertinent history of HFpEF, severe MR, EtOH abuse, depression/anxiety  He presented to the hospital on 04/27/2023  with nausea and vomiting after drinking heavily for 4 to 5 days.  He had apparently had also taken a street drug.  He was admitted with acute liver injury and AKI due to rhabdomyolysis.  Renal function worsened and have progressive metabolic acidosis.  Started on HD on 3/11.  Developed respiratory distress on 3/13, placed on BiPAP and went for urgent dialysis.  Also had fevers and was placed on broad-spectrum antibiotics for possible cellulitis.  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 Advanced heart failure was consulted regarding his HFpEF and severe MR.  He was seen by Dr. Shirlee Latch on 05/03/2023.  Per Dr. Shirlee Latch patient was volume overloaded and once volume status improved he would recommend repeating echo to reassess his MR as this may be more severe in the setting of acute volume loading.  Subsequently his condition improved he did not require any further dialysis since 05/04/2023.   Discussed the use of AI scribe software for clinical note transcription with the patient, who gave verbal consent to proceed.  Today patient arrives into clinic with his mother.  He is without any acute cardiovascular concerns or complaints at this time.  The patient reports that he has been feeling well since his discharge from the hospital. He has been adhering to the diet recommended during his hospital stay and has been engaging in  light physical activity such as walking. He denies any current symptoms of shortness of breath, syncope, lightheadedness/dizziness, or chest pain. He also reports that his kidney function is gradually improving, as noted by his kidney specialist at [facility]. The patient denies any use of street drugs but admits to smoking marijuana.  He has stayed abstinent from alcohol since he was discharged home.  He is currently not working but is able to exert himself without angina or other symptoms.  He denies chest pain, shortness of breath, lower extremity edema, fatigue, palpitations, melena, hematuria, hemoptysis, diaphoresis, weakness, presyncope, syncope, orthopnea, and PND.    Review of systems:  Please see the history of present illness. All other systems are reviewed and otherwise negative.     Home Medications:    Current Meds  Medication Sig   folic acid (FOLVITE) 1 MG tablet Take 1 tablet (1 mg total) by mouth daily.   pantoprazole (PROTONIX) 40 MG tablet Take 1 tablet (40 mg total) by mouth daily.   thiamine (VITAMIN B1) 100 MG tablet Take 1 tablet (100 mg total) by mouth daily.   Studies Reviewed:       Echocardiogram 05/03/2023 1. Left ventricular ejection fraction, by estimation, is 55 to 60%. The  left ventricle has normal function. The left ventricle has no regional  wall motion abnormalities. The left ventricular internal cavity size was  mildly dilated. Indeterminate  diastolic filling due to E-A fusion. There is the interventricular septum  is flattened in systole and diastole, consistent with right ventricular  pressure and volume overload.   2. Right ventricular systolic function is normal. The right ventricular  size is normal. There is moderately elevated pulmonary artery systolic  pressure. The estimated right ventricular systolic pressure is 49.1 mmHg.   3. Left atrial size was moderately dilated.   4. The mitral valve is myxomatous. Severe mitral valve  regurgitation. No  evidence of mitral stenosis.   5. Tricuspid valve regurgitation is moderate.   6. The aortic valve is grossly normal. Aortic valve regurgitation is not  visualized. No aortic stenosis is present.  Risk Assessment/Calculations:             Physical Exam:   VS:  BP 102/80 (BP Location: Right Arm, Patient Position: Sitting, Cuff Size: Normal)   Pulse (!) 107   Ht 5' 9.5" (1.765 m)   Wt 151 lb (68.5 kg)   SpO2 98%   BMI 21.98 kg/m    Wt Readings from Last 3 Encounters:  05/20/23 151 lb (68.5 kg)  05/08/23 157 lb 6.5 oz (71.4 kg)  11/11/22 153 lb (69.4 kg)    GEN: Well nourished, well developed in no acute distress NECK: No JVD; No carotid bruits CARDIAC: RRR, no murmurs, rubs, gallops RESPIRATORY:  Clear to auscultation without rales, wheezing or rhonchi  ABDOMEN: Soft, non-tender, non-distended EXTREMITIES:  No edema; No acute deformity     Assessment and Plan:  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  - Thought to be related to volume overload in the setting of AKI caused by rhabdomyolysis s/p dialysis - Today he is euvolemic and well compensated on exam.  Overall asymptomatic without overt heart failure symptoms.  NYHA class I - No significant murmur on exam today - Currently following with [facility] and notes that his kidneys are gradually improving.  No longer requiring dialysis - Plan for repeat echocardiogram ordered today to reevaluate his MR since he is now euvolemic.  If MR remains greater than moderate we will consider TEE at that time  Acute renal failure S/p dialysis on 3/11 and 3/12 in the setting of rhabdomyolysis and alcohol binge - Do not have access to most updated labs however patient reports [facility] has noted his kidney function has been  gradually improving - Managed by nephrology            Dispo:  Return in about 6 weeks (around 07/01/2023).  Signed, Denyce Robert, NP

## 2023-05-20 NOTE — Patient Instructions (Signed)
 Medication Instructions:  NO CHANGES   Lab Work: NONE   Testing/Procedures: Your physician has requested that you have an echocardiogram. Echocardiography is a painless test that uses sound waves to create images of your heart. It provides your doctor with information about the size and shape of your heart and how well your heart's chambers and valves are working. This procedure takes approximately one hour. There are no restrictions for this procedure. Please do NOT wear cologne, perfume, aftershave, or lotions (deodorant is allowed). Please arrive 15 minutes prior to your appointment time.  Please note: We ask at that you not bring children with you during ultrasound (echo/ vascular) testing. Due to room size and safety concerns, children are not allowed in the ultrasound rooms during exams. Our front office staff cannot provide observation of children in our lobby area while testing is being conducted. An adult accompanying a patient to their appointment will only be allowed in the ultrasound room at the discretion of the ultrasound technician under special circumstances. We apologize for any inconvenience.   Follow-Up: At [facility], you and your health needs are our priority.  As part of our continuing mission to provide you with exceptional heart care, our providers are all part of one team.  This team includes your primary Cardiologist (physician) and Advanced Practice Providers or APPs (Physician Assistants and Nurse Practitioners) who all work together to provide you with the care you need, when you need it.  Your next appointment:   6 week(s)  Provider:   Rise Paganini, DNP    Other Instructions:       1st Floor: - Lobby - Registration  - Pharmacy  - Lab - Cafe  2nd Floor: - PV Lab - Diagnostic Testing (echo, CT, nuclear med)  3rd Floor: - Vacant  4th Floor: - TCTS (cardiothoracic surgery) - AFib Clinic - Structural Heart Clinic - Vascular  Surgery  - Vascular Ultrasound  5th Floor: - [facility] (general and EP) - Clinical Pharmacy for coumadin, hypertension, lipid, weight-loss medications, and med management appointments    Valet parking services will be available as well.

## 2023-06-13 ENCOUNTER — Other Ambulatory Visit (HOSPITAL_COMMUNITY): Payer: Self-pay

## 2023-06-14 ENCOUNTER — Other Ambulatory Visit: Payer: Self-pay

## 2023-06-17 ENCOUNTER — Other Ambulatory Visit: Payer: Self-pay

## 2023-06-18 ENCOUNTER — Encounter: Payer: Self-pay | Admitting: Nurse Practitioner

## 2023-06-18 ENCOUNTER — Ambulatory Visit: Payer: Self-pay | Admitting: Nurse Practitioner

## 2023-06-18 VITALS — BP 113/83 | HR 85 | Temp 98.4°F | Wt 157.4 lb

## 2023-06-18 DIAGNOSIS — N179 Acute kidney failure, unspecified: Secondary | ICD-10-CM

## 2023-06-18 DIAGNOSIS — R7989 Other specified abnormal findings of blood chemistry: Secondary | ICD-10-CM

## 2023-06-18 DIAGNOSIS — S92154D Nondisplaced avulsion fracture (chip fracture) of right talus, subsequent encounter for fracture with routine healing: Secondary | ICD-10-CM

## 2023-06-18 DIAGNOSIS — I340 Nonrheumatic mitral (valve) insufficiency: Secondary | ICD-10-CM

## 2023-06-18 DIAGNOSIS — F1010 Alcohol abuse, uncomplicated: Secondary | ICD-10-CM

## 2023-06-18 DIAGNOSIS — L03114 Cellulitis of left upper limb: Secondary | ICD-10-CM

## 2023-06-18 DIAGNOSIS — J9601 Acute respiratory failure with hypoxia: Secondary | ICD-10-CM

## 2023-06-18 DIAGNOSIS — F1910 Other psychoactive substance abuse, uncomplicated: Secondary | ICD-10-CM | POA: Diagnosis not present

## 2023-06-18 DIAGNOSIS — E559 Vitamin D deficiency, unspecified: Secondary | ICD-10-CM | POA: Diagnosis not present

## 2023-06-18 MED ORDER — FOLIC ACID 1 MG PO TABS: 1.0000 mg | ORAL_TABLET | Freq: Every day | ORAL | 2 refills | Status: DC

## 2023-06-18 MED ORDER — THIAMINE HCL 100 MG PO TABS: 100.0000 mg | ORAL_TABLET | Freq: Every day | ORAL | 2 refills | Status: AC

## 2023-06-18 NOTE — Assessment & Plan Note (Signed)
 Now resolved.

## 2023-06-18 NOTE — Assessment & Plan Note (Signed)
 Patient encouraged to continue to abstain from drinking alcohol Continue folic acid  1 mg daily, thiamine  100 mg daily

## 2023-06-18 NOTE — Assessment & Plan Note (Signed)
 Cardiology plans on doing a repeat echocardiogram Patient encouraged to maintain close follow-up with cardiology

## 2023-06-18 NOTE — Patient Instructions (Signed)

## 2023-06-18 NOTE — Assessment & Plan Note (Addendum)
 Last vitamin D  Lab Results  Component Value Date   VD25OH 4.59 (L) 04/29/2023  Currently not on vitamin D  supplement Checking labs

## 2023-06-18 NOTE — Progress Notes (Signed)
 Established Patient Office Visit  Subjective:  Patient ID: Andrew Page, male    DOB: April 10, 1995  Age: 28 y.o. MRN: [id number]  CC:  Chief Complaint  Patient presents with   Establish Care   Hospitalization Follow-up    HPI Andrew Page is a 28 y.o. male   has a past medical history of Anxiety, Attention deficit hyperactivity disorder (ADHD), Boxers fracture (07/02/2011), Depression, Inguinal hernia, Oppositional defiant disorder (04/19/2005), Substance abuse (HCC), and Tinea capitis.  Patient presents to establish care for his chronic medical conditions.  He is accompanied by his mother.  Patient was okay with daily border remaining in the examination room throughout office visit today  AKI patient was on admission from 04/27/2023 to 05/08/2023 for AKI, acute metabolic acidosis rhabdomyolysis.  AKI was thought to be possibly due to pigment nephropathy/nontraumatic rhabdomyolysis.  Renal ultrasound was negative for hydronephrosis patient was initially With IV fluids but renal function was not, nephrology was consulted and patient was started on hemodialysis on 04/30/2023 he received intermittent hemodialysis during hospitalization.  No more dialysis since 05/04/2023.  Patient states that he is making urine, denies shortness of breath, edema, has upcoming appointment with nephrologist.  Acute respiratory failure with hypoxia secondary to acute diastolic CHF exacerbation secondary to volume overload from acute renal failure.  He currently denies shortness of breath, O2 sats was normal on room air.  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  He has since establish care with cardiology, Echocardiogram 04/2023 with mildly dilated LV w/ EF 55%, mildly D-shaped IV septum with normal RV size/function, PASP 49, moderate TR, moderate-severe MR with dilated IVC.  has upcoming repeat  echocardiogram.  Currently denies chest pain, shortness of  breath  Alcoholic hepatitis.  Stated that he has quit drinking alcohol.  Stated that he used to have 25 shots daily prior to his last hospitalization denies abdominal pain nausea vomiting.  Continues folic acid  and thiamine   Vitamin D  deficiency.  Was on vitamin D  50,000 units once weekly, currently not taking vitamin D  supplement  Normocytic anemia.  Taking pantoprazole  40 mg daily, no complaints of abnormal bleeding, hematemesis, hematochezia   He is doing well generally and ready to go back to work, work note provided   Past Medical History:  Diagnosis Date   Anxiety    Attention deficit hyperactivity disorder (ADHD)    Boxers fracture 07/02/2011   Cast applied in the ED. Removed by Ortho.   Depression    Inguinal hernia    RIGHT   Oppositional defiant disorder 04/19/2005   Borderline/ low average intelligence (Questionable)   Substance abuse (HCC)    Tinea capitis     Past Surgical History:  Procedure Laterality Date   HAND SURGERY     HERNIA REPAIR     INGUINAL HERNIA REPAIR Right 12/10/2014   Procedure: LAPAROSCOPIC RIGHT INGUINAL HERNIA REPAIR WITH MESH;  Surgeon: Shela Derby, MD;  Location: [facility];  Service: General;  Laterality: Right;   INSERTION OF MESH Right 12/10/2014   Procedure: INSERTION OF MESH;  Surgeon: Shela Derby, MD;  Location: [facility];  Service: General;  Laterality: Right;   IR FLUORO GUIDE CV LINE RIGHT  04/30/2023   IR US  GUIDE VASC ACCESS RIGHT  04/30/2023    Family History  Problem Relation Age of Onset   Hypertension Mother    Eczema Brother        Twin   Diabetes Maternal Grandfather  Diabetes Paternal Grandfather    Congestive Heart Failure Paternal Grandfather    Colon cancer Other    Eczema Other        elbows, behind the knees, worse in the summer. Previously treated with triamcinolonce 0.1% cream with sucess.   Rectal cancer Neg Hx    Stomach cancer Neg Hx     Social History   Socioeconomic History   Marital status:  Single    Spouse name: Not on file   Number of children: 3   Years of education: Not on file   Highest education level: Not on file  Occupational History   Not on file  Tobacco Use   Smoking status: Every Day    Types: E-cigarettes   Smokeless tobacco: Never  Vaping Use   Vaping status: Some Days  Substance and Sexual Activity   Alcohol use: Not Currently    Comment: 1 bottle every 2-3 days.   Drug use: Yes    Frequency: 7.0 times per week    Types: Marijuana   Sexual activity: Yes    Birth control/protection: Condom  Other Topics Concern   Not on file  Social History Narrative   Lives in [place] with mother twin brother, 2 older brothers. Father not involved. Twin brother murdered in May 2019.   Social Drivers of Corporate investment banker Strain: Not on file  Food Insecurity: No Food Insecurity (04/28/2023)   Hunger Vital Sign    Worried About Running Out of Food in the Last Year: Never true    Ran Out of Food in the Last Year: Never true  Transportation Needs: No Transportation Needs (04/28/2023)   PRAPARE - Administrator, Civil Service (Medical): No    Lack of Transportation (Non-Medical): No  Physical Activity: Not on file  Stress: Not on file  Social Connections: Not on file  Intimate Partner Violence: Not At Risk (04/28/2023)   Humiliation, Afraid, Rape, and Kick questionnaire    Fear of Current or Ex-Partner: No    Emotionally Abused: No    Physically Abused: No    Sexually Abused: No    Outpatient Medications Prior to Visit  Medication Sig Dispense Refill   pantoprazole  (PROTONIX ) 40 MG tablet Take 1 tablet (40 mg total) by mouth daily. 30 tablet 0   folic acid  (FOLVITE ) 1 MG tablet Take 1 tablet (1 mg total) by mouth daily. 30 tablet 0   thiamine  (VITAMIN B1) 100 MG tablet Take 1 tablet (100 mg total) by mouth daily. 30 tablet 0   Multiple Vitamin (MULTIVITAMIN WITH MINERALS) TABS tablet Take 1 tablet by mouth daily. (Patient not taking: Reported on  06/18/2023)     Vitamin D , Ergocalciferol , (DRISDOL ) 1.25 MG (50000 UNIT) CAPS capsule Take 1 capsule (50,000 Units total) by mouth every 7 (seven) days. (Patient not taking: Reported on 06/18/2023) 5 capsule 0   No facility-administered medications prior to visit.    No Known Allergies  ROS Review of Systems  Constitutional:  Negative for appetite change, chills, fatigue and fever.  HENT:  Negative for congestion, postnasal drip, rhinorrhea and sneezing.   Respiratory:  Negative for cough, shortness of breath and wheezing.   Cardiovascular:  Negative for chest pain, palpitations and leg swelling.  Gastrointestinal:  Negative for abdominal pain, constipation, nausea and vomiting.  Genitourinary:  Negative for difficulty urinating, dysuria, flank pain and frequency.  Musculoskeletal:  Negative for arthralgias, back pain, joint swelling and myalgias.  Skin:  Negative for color  change, pallor, rash and wound.  Neurological:  Negative for dizziness, facial asymmetry, weakness, numbness and headaches.  Psychiatric/Behavioral:  Negative for behavioral problems, confusion, self-injury and suicidal ideas.       Objective:    Physical Exam Vitals and nursing note reviewed.  Constitutional:      General: He is not in acute distress.    Appearance: Normal appearance. He is not ill-appearing, toxic-appearing or diaphoretic.  Eyes:     General: No scleral icterus.       Right eye: No discharge.        Left eye: No discharge.     Extraocular Movements: Extraocular movements intact.     Conjunctiva/sclera: Conjunctivae normal.  Cardiovascular:     Rate and Rhythm: Normal rate and regular rhythm.     Pulses: Normal pulses.     Heart sounds: Normal heart sounds. No murmur heard.    No friction rub. No gallop.  Pulmonary:     Effort: Pulmonary effort is normal. No respiratory distress.     Breath sounds: Normal breath sounds. No stridor. No wheezing, rhonchi or rales.  Chest:     Chest wall:  No tenderness.  Abdominal:     General: There is no distension.     Palpations: Abdomen is soft.     Tenderness: There is no abdominal tenderness. There is no right CVA tenderness, left CVA tenderness or guarding.  Musculoskeletal:        General: No swelling, tenderness, deformity or signs of injury.     Right lower leg: No edema.     Left lower leg: No edema.  Skin:    General: Skin is warm and dry.     Capillary Refill: Capillary refill takes less than 2 seconds.     Coloration: Skin is not jaundiced or pale.     Findings: No bruising, erythema or lesion.  Neurological:     Mental Status: He is alert and oriented to person, place, and time.     Motor: No weakness.     Coordination: Coordination normal.     Gait: Gait normal.  Psychiatric:        Mood and Affect: Mood normal.        Behavior: Behavior normal.        Thought Content: Thought content normal.        Judgment: Judgment normal.     BP 113/83   Pulse 85   Temp 98.4 F (36.9 C) (Oral)   Wt 157 lb 6.4 oz (71.4 kg)   SpO2 100%   BMI 22.91 kg/m  Wt Readings from Last 3 Encounters:  06/18/23 157 lb 6.4 oz (71.4 kg)  05/20/23 151 lb (68.5 kg)  05/08/23 157 lb 6.5 oz (71.4 kg)    No results found for: "TSH" Lab Results  Component Value Date   WBC 8.0 05/08/2023   HGB 9.9 (L) 05/08/2023   HCT 29.8 (L) 05/08/2023   MCV 92.3 05/08/2023   PLT 345 05/08/2023   Lab Results  Component Value Date   NA 139 05/08/2023   K 3.6 05/08/2023   CO2 23 05/08/2023   GLUCOSE 133 (H) 05/08/2023   BUN 44 (H) 05/08/2023   CREATININE 9.49 (H) 05/08/2023   BILITOT 0.6 05/08/2023   ALKPHOS 50 05/08/2023   AST 32 05/08/2023   ALT 146 (H) 05/08/2023   PROT 6.5 05/08/2023   ALBUMIN 2.8 (L) 05/08/2023   CALCIUM  9.0 05/08/2023   ANIONGAP 13 05/08/2023   No  results found for: "CHOL" No results found for: "HDL" No results found for: "LDLCALC" No results found for: "TRIG" No results found for: "CHOLHDL" No results found  for: "HGBA1C"    Assessment & Plan:   Problem List Items Addressed This Visit       Cardiovascular and Mediastinum   Severe mitral regurgitation   Cardiology plans on doing a repeat echocardiogram Patient encouraged to maintain close follow-up with cardiology        Respiratory   Acute respiratory failure with hypoxia (HCC)   Now resolved        Musculoskeletal and Integument   Avulsion fracture of right talus   Currently denies pain, walking independently without difficulty.        Genitourinary   AKI (acute kidney injury) [facility]) - Primary   Lab Results  Component Value Date   NA 139 05/08/2023   K 3.6 05/08/2023   CO2 23 05/08/2023   GLUCOSE 133 (H) 05/08/2023   BUN 44 (H) 05/08/2023   CREATININE 9.49 (H) 05/08/2023   CALCIUM  9.0 05/08/2023   GFRNONAA 7 (L) 05/08/2023  Avoid NSAIDs and other nephrotoxic agents Rechecking labs Encouraged to maintain close follow-up with nephrologist      Relevant Orders   CBC   CMP14+EGFR     Other   Substance abuse (HCC)   Need to avoid use of illicit drugs discussed      Elevated LFTs   Lab Results  Component Value Date   ALT 146 (H) 05/08/2023   AST 32 05/08/2023   ALKPHOS 50 05/08/2023   BILITOT 0.6 05/08/2023  Encouraged to continue to abstain from drinking alcohol      Alcohol abuse   Patient encouraged to continue to abstain from drinking alcohol Continue folic acid  1 mg daily, thiamine  100 mg daily      Vitamin D  deficiency   Last vitamin D  Lab Results  Component Value Date   VD25OH 4.59 (L) 04/29/2023  Currently not on vitamin D  supplement Checking labs      Relevant Orders   VITAMIN D  25 Hydroxy (Vit-D Deficiency, Fractures)   Cellulitis of left upper extremity   Now resolved, was treated with antibiotics while on admission       Meds ordered this encounter  Medications   thiamine  (VITAMIN B1) 100 MG tablet    Sig: Take 1 tablet (100 mg total) by mouth daily.    Dispense:  30 tablet     Refill:  2   folic acid  (FOLVITE ) 1 MG tablet    Sig: Take 1 tablet (1 mg total) by mouth daily.    Dispense:  30 tablet    Refill:  2    Follow-up: Return in about 3 months (around 09/17/2023) for CPE.    Herron Fero R Kynslee Baham, FNP

## 2023-06-18 NOTE — Assessment & Plan Note (Signed)
 Need to avoid use of illicit drugs discussed

## 2023-06-18 NOTE — Assessment & Plan Note (Deleted)
 Need to avoid use of illicit drugs discussed

## 2023-06-18 NOTE — Assessment & Plan Note (Addendum)
 Currently denies pain, walking independently without difficulty.

## 2023-06-18 NOTE — Assessment & Plan Note (Signed)
 Lab Results  Component Value Date   ALT 146 (H) 05/08/2023   AST 32 05/08/2023   ALKPHOS 50 05/08/2023   BILITOT 0.6 05/08/2023  Encouraged to continue to abstain from drinking alcohol

## 2023-06-18 NOTE — Assessment & Plan Note (Signed)
 Lab Results  Component Value Date   NA 139 05/08/2023   K 3.6 05/08/2023   CO2 23 05/08/2023   GLUCOSE 133 (H) 05/08/2023   BUN 44 (H) 05/08/2023   CREATININE 9.49 (H) 05/08/2023   CALCIUM  9.0 05/08/2023   GFRNONAA 7 (L) 05/08/2023  Avoid NSAIDs and other nephrotoxic agents Rechecking labs Encouraged to maintain close follow-up with nephrologist

## 2023-06-18 NOTE — Assessment & Plan Note (Signed)
 Now resolved, was treated with antibiotics while on admission

## 2023-06-19 DIAGNOSIS — I340 Nonrheumatic mitral (valve) insufficiency: Secondary | ICD-10-CM | POA: Diagnosis not present

## 2023-06-19 DIAGNOSIS — M6282 Rhabdomyolysis: Secondary | ICD-10-CM | POA: Diagnosis not present

## 2023-06-19 DIAGNOSIS — N179 Acute kidney failure, unspecified: Secondary | ICD-10-CM | POA: Diagnosis not present

## 2023-06-19 DIAGNOSIS — F1014 Alcohol abuse with alcohol-induced mood disorder: Secondary | ICD-10-CM | POA: Diagnosis not present

## 2023-06-19 DIAGNOSIS — I272 Pulmonary hypertension, unspecified: Secondary | ICD-10-CM | POA: Diagnosis not present

## 2023-06-19 LAB — CMP14+EGFR
ALT: 38 IU/L (ref 0–44)
AST: 24 IU/L (ref 0–40)
Albumin: 4.7 g/dL (ref 4.3–5.2)
Alkaline Phosphatase: 81 IU/L (ref 44–121)
BUN/Creatinine Ratio: 8 — ABNORMAL LOW (ref 9–20)
BUN: 10 mg/dL (ref 6–20)
Bilirubin Total: 0.7 mg/dL (ref 0.0–1.2)
CO2: 22 mmol/L (ref 20–29)
Calcium: 10 mg/dL (ref 8.7–10.2)
Chloride: 100 mmol/L (ref 96–106)
Creatinine, Ser: 1.28 mg/dL — ABNORMAL HIGH (ref 0.76–1.27)
Globulin, Total: 2.9 g/dL (ref 1.5–4.5)
Glucose: 94 mg/dL (ref 70–99)
Potassium: 4.3 mmol/L (ref 3.5–5.2)
Sodium: 137 mmol/L (ref 134–144)
Total Protein: 7.6 g/dL (ref 6.0–8.5)
eGFR: 79 mL/min/{1.73_m2} (ref >59)

## 2023-06-19 LAB — CBC
Hematocrit: 39.1 % (ref 37.5–51.0)
Hemoglobin: 12.9 g/dL — ABNORMAL LOW (ref 13.0–17.7)
MCH: 30.6 pg (ref 26.6–33.0)
MCHC: 33 g/dL (ref 31.5–35.7)
MCV: 93 fL (ref 79–97)
Platelets: 296 10*3/uL (ref 150–450)
RBC: 4.21 x10E6/uL (ref 4.14–5.80)
RDW: 12.8 % (ref 11.6–15.4)
WBC: 4 10*3/uL (ref 3.4–10.8)

## 2023-06-19 LAB — VITAMIN D 25 HYDROXY (VIT D DEFICIENCY, FRACTURES): Vit D, 25-Hydroxy: 30.4 ng/mL (ref 30.0–100.0)

## 2023-06-25 ENCOUNTER — Ambulatory Visit (HOSPITAL_COMMUNITY)
Admission: RE | Admit: 2023-06-25 | Discharge: 2023-06-25 | Disposition: A | Discharge status: Home / Self Care | Source: Ambulatory Visit | Attending: Emergency Medicine | Admitting: Emergency Medicine

## 2023-06-25 DIAGNOSIS — I340 Nonrheumatic mitral (valve) insufficiency: Secondary | ICD-10-CM | POA: Diagnosis not present

## 2023-06-25 LAB — ECHOCARDIOGRAM COMPLETE
AR max vel: 2.71 cm2
AV Area VTI: 2.56 cm2
AV Area mean vel: 2.81 cm2
AV Mean grad: 3 mmHg
AV Peak grad: 6.1 mmHg
Ao pk vel: 1.23 m/s
Area-P 1/2: 4.63 cm2
S' Lateral: 3.52 cm

## 2023-07-04 ENCOUNTER — Other Ambulatory Visit (HOSPITAL_COMMUNITY): Payer: Self-pay

## 2023-07-12 ENCOUNTER — Encounter: Payer: Self-pay | Admitting: Emergency Medicine

## 2023-07-12 ENCOUNTER — Ambulatory Visit: Attending: Emergency Medicine | Admitting: Emergency Medicine

## 2023-07-12 VITALS — BP 90/70 | HR 97 | Ht 70.0 in | Wt 155.0 lb

## 2023-07-12 DIAGNOSIS — I5032 Chronic diastolic (congestive) heart failure: Secondary | ICD-10-CM | POA: Diagnosis not present

## 2023-07-12 DIAGNOSIS — I340 Nonrheumatic mitral (valve) insufficiency: Secondary | ICD-10-CM | POA: Diagnosis not present

## 2023-07-12 DIAGNOSIS — N179 Acute kidney failure, unspecified: Secondary | ICD-10-CM | POA: Insufficient documentation

## 2023-07-12 DIAGNOSIS — I361 Nonrheumatic tricuspid (valve) insufficiency: Secondary | ICD-10-CM | POA: Insufficient documentation

## 2023-07-12 NOTE — Patient Instructions (Signed)
 Medication Instructions:  NO CHANGES  Lab Work: NONE  Testing/Procedures: NONE  Follow-Up: At [facility], you and your health needs are our priority.  As part of our continuing mission to provide you with exceptional heart care, our providers are all part of one team.  This team includes your primary Cardiologist (physician) and Advanced Practice Providers or APPs (Physician Assistants and Nurse Practitioners) who all work together to provide you with the care you need, when you need it.  Your next appointment:   1 YEAR  Provider:   MADISON FOUNTAIN, DNP

## 2023-07-12 NOTE — Progress Notes (Signed)
 Cardiology Office Note:    Date:  07/12/2023  ID:  Cinque Begley, DOB 01/17/1996, MRN [id number] PCP: Paseda, Folashade R, FNP  [facility] Providers Cardiologist:  Bridgette Campus, MD       Patient Profile:       Chief Complaint: 46-month follow-up for MR & HFpEF History of Present Illness:  Antolin Belsito is a 28 y.o. male with visit-pertinent history of HFpEF, severe MR, EtOH abuse, depression/anxiety   He presented to the hospital on 04/27/2023  with nausea and vomiting after drinking heavily for 4 to 5 days.  He had apparently had also taken a street drug.  He was admitted with acute liver injury and AKI due to rhabdomyolysis.  Renal function worsened and have progressive metabolic acidosis.  Started on HD on 3/11.  Developed respiratory distress on 3/13, placed on BiPAP and went for urgent dialysis.  Also had fevers and was placed on broad-spectrum antibiotics for possible cellulitis.  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 Advanced heart failure was consulted regarding his HFpEF and severe MR.  He was seen by Dr. Mitzie Anda on 05/03/2023.  Per Dr. Mitzie Anda patient was volume overloaded and once volume status improved he would recommend repeating echo to reassess his MR as this may be more severe in the setting of acute volume loading.  Subsequently his condition improved he did not require any further dialysis since 05/04/2023.  He was last seen in office on 05/20/2023.  He was doing well without cardiovascular concerns.  He noted that his kidney function has been gradually improving as noted by his kidney specialist at [facility].  He denied any use of street drugs but admits to smoking marijuana.  He had stayed abstinent from alcohol since his discharge home.  Repeat echocardiogram was ordered and completed on 06/25/2023 showing LVEF 60 to  65%, no RWMA, normal diastolic parameters, RV function and size normal, no significant valvular abnormalities.   Discussed the use of AI scribe software for clinical note transcription with the patient, who gave verbal consent to proceed.  History of Present Illness Richard Ritchey is a 28 year old male who presents for follow-up after repeat echocardiogram.  Today he is without any acute cardiovascular concerns or complaints.  He feels good and has returned to work. He experiences no shortness of breath, chest pain, orthopnea, PND, palpitations, syncope, or leg swelling.  He continues to see nephrology for ARF, which has improved with a GFR of 79 and creatinine of 1.28.  He has remained abstinent from alcohol.  He uses marijuana occasionally.   Review of systems:  Please see the history of present illness. All other systems are reviewed and otherwise negative.      Studies Reviewed:        Echocardiogram 06/25/2023 1. Left ventricular ejection fraction, by estimation, is 60 to 65%. Left  ventricular ejection fraction by 3D volume is 61 %. The left ventricle has  normal function. The left ventricle has no regional wall motion  abnormalities. Left ventricular diastolic   parameters were normal.   2. Right ventricular systolic function is normal. The right ventricular  size is normal. Tricuspid regurgitation signal is inadequate for assessing  PA pressure.   3. The mitral valve is normal in structure. Trivial mitral valve  regurgitation. No evidence of mitral stenosis.   4. The aortic valve is normal  in structure. Aortic valve regurgitation is  not visualized. No aortic stenosis is present.   5. The inferior vena cava is normal in size with greater than 50%  respiratory variability, suggesting right atrial pressure of 3 mmHg.   Echocardiogram 05/03/2023  1. Left ventricular ejection fraction, by estimation, is 55 to 60%. The  left ventricle has normal function. The left ventricle  has no regional  wall motion abnormalities. The left ventricular internal cavity size was  mildly dilated. Indeterminate  diastolic filling due to E-A fusion. There is the interventricular septum  is flattened in systole and diastole, consistent with right ventricular  pressure and volume overload.   2. Right ventricular systolic function is normal. The right ventricular  size is normal. There is moderately elevated pulmonary artery systolic  pressure. The estimated right ventricular systolic pressure is 49.1 mmHg.   3. Left atrial size was moderately dilated.   4. 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 No  evidence of mitral stenosis.   5. Tricuspid valve regurgitation is moderate.   6. The aortic valve is grossly normal. Aortic valve regurgitation is not  visualized. No aortic stenosis is present.  Risk Assessment/Calculations:              Physical Exam:   VS:  BP 90/70 (BP Location: Left Arm, Patient Position: Sitting, Cuff Size: Normal)   Pulse 97   Ht 5\' 10"  (1.778 m)   Wt 155 lb (70.3 kg)   BMI 22.24 kg/m    Wt Readings from Last 3 Encounters:  07/12/23 155 lb (70.3 kg)  06/18/23 157 lb 6.4 oz (71.4 kg)  05/20/23 151 lb (68.5 kg)    GEN: Well nourished, well developed in no acute distress NECK: No JVD; No carotid bruits CARDIAC: RRR, no murmurs, rubs, gallops RESPIRATORY:  Clear to auscultation without rales, wheezing or rhonchi  ABDOMEN: Soft, non-tender, non-distended EXTREMITIES:  No edema; No acute deformity      Assessment and Plan:  Mitral regurgitation Tricuspid regurgitation HFpEF Echocardiogram 04/2023 with mildly dilated LV w/ EF 55-60%, mildly D-shaped IV septum with normal RV size/function, PASP 49, moderate TR, myxomatous mitral valve w/ moderate-severe MR with dilated IVC Repeat echocardiogram 06/2023 showed LVEF 60 to 65%, trivial mitral valve regurgitation and mild tricuspid valve regurgitation - Severe MR was in the  setting of volume overload due to acute renal failure caused by rhabdomyolysis after ETOH binge  - Repeat echocardiogram showed improved/normal function of his mitral valve showing his MR at the time appeared more severe in the setting of volume overload - Today he is euvolemic and well compensated on exam.  NYHA class I with no current symptoms - Will continue to clinically monitor  Acute renal failure S/p dialysis on 3/11 and 3/12 in the setting of rhabdomyolysis and EtOH abuse. He has not required any more dialysis since 05/04/2023  - Most recent Cr 1.28 and GFR 79 on 05/2023 - Managed by nephrology     Dispo:  Return in about 1 year (around 07/11/2024).  Signed, Ava Boatman, NP

## 2023-09-17 ENCOUNTER — Ambulatory Visit (INDEPENDENT_AMBULATORY_CARE_PROVIDER_SITE_OTHER): Admitting: Nurse Practitioner

## 2023-09-17 ENCOUNTER — Encounter: Payer: Self-pay | Admitting: Nurse Practitioner

## 2023-09-17 VITALS — BP 118/82 | HR 60 | Temp 97.9°F | Wt 152.8 lb

## 2023-09-17 DIAGNOSIS — F1010 Alcohol abuse, uncomplicated: Secondary | ICD-10-CM | POA: Diagnosis not present

## 2023-09-17 DIAGNOSIS — Z13228 Encounter for screening for other metabolic disorders: Secondary | ICD-10-CM | POA: Diagnosis not present

## 2023-09-17 DIAGNOSIS — Z720 Tobacco use: Secondary | ICD-10-CM | POA: Insufficient documentation

## 2023-09-17 DIAGNOSIS — F1910 Other psychoactive substance abuse, uncomplicated: Secondary | ICD-10-CM | POA: Diagnosis not present

## 2023-09-17 DIAGNOSIS — Z130 Encounter for screening for diseases of the blood and blood-forming organs and certain disorders involving the immune mechanism: Secondary | ICD-10-CM | POA: Diagnosis not present

## 2023-09-17 DIAGNOSIS — Z1329 Encounter for screening for other suspected endocrine disorder: Secondary | ICD-10-CM

## 2023-09-17 DIAGNOSIS — Z0000 Encounter for general adult medical examination without abnormal findings: Secondary | ICD-10-CM | POA: Insufficient documentation

## 2023-09-17 DIAGNOSIS — E559 Vitamin D deficiency, unspecified: Secondary | ICD-10-CM

## 2023-09-17 DIAGNOSIS — Z1321 Encounter for screening for nutritional disorder: Secondary | ICD-10-CM

## 2023-09-17 NOTE — Patient Instructions (Signed)

## 2023-09-17 NOTE — Assessment & Plan Note (Signed)
 Encouraged to continue to abstain from drinking alcohol Takes folic acid  1 mg daily and thiamine  100 mg daily

## 2023-09-17 NOTE — Assessment & Plan Note (Signed)
 Need to stop smoking tobacco discussed

## 2023-09-17 NOTE — Progress Notes (Signed)
 Established Patient Office Visit  Subjective:  Patient ID: Andrew Page, male    DOB: 1995-10-02  Age: 28 y.o. MRN: [id number]  CC:  Chief Complaint  Patient presents with   Annual Exam    HPI Andrew Page is a 28 y.o. male  has a past medical history of Anxiety, Attention deficit hyperactivity disorder (ADHD), Boxers fracture (07/02/2011), Depression, Inguinal hernia, Oppositional defiant disorder (04/19/2005), Substance abuse (HCC), and Tinea capitis.  Patient presents for a complete physical He is on a general diet, just walking for exercise, he generally feels well and has been sleeping well.  Stated that he has seen nephrology since his last appointment, please next appointment with them is in November Sees cardiology next year    He has stopped drinking alcohol, stopped smoking cigarettes stopped vaping.  He now smokes 2 black and mild daily, smokes marijuana daily    Last vitamin D  Lab Results  Component Value Date   VD25OH 30.4 06/18/2023     Past Medical History:  Diagnosis Date   Anxiety    Attention deficit hyperactivity disorder (ADHD)    Boxers fracture 07/02/2011   Cast applied in the ED. Removed by Ortho.   Depression    Inguinal hernia    RIGHT   Oppositional defiant disorder 04/19/2005   Borderline/ low average intelligence (Questionable)   Substance abuse (HCC)    Tinea capitis     Past Surgical History:  Procedure Laterality Date   HAND SURGERY     HERNIA REPAIR     INGUINAL HERNIA REPAIR Right 12/10/2014   Procedure: LAPAROSCOPIC RIGHT INGUINAL HERNIA REPAIR WITH MESH;  Surgeon: Lynda Leos, MD;  Location: [facility];  Service: General;  Laterality: Right;   INSERTION OF MESH Right 12/10/2014   Procedure: INSERTION OF MESH;  Surgeon: Lynda Leos, MD;  Location: [facility];  Service: General;  Laterality: Right;   IR FLUORO GUIDE CV LINE RIGHT  04/30/2023   IR US  GUIDE VASC ACCESS RIGHT  04/30/2023    Family History  Problem Relation  Age of Onset   Hypertension Mother    Eczema Brother        Twin   Diabetes Maternal Grandfather    Diabetes Paternal Grandfather    Congestive Heart Failure Paternal Grandfather    Colon cancer Other    Eczema Other        elbows, behind the knees, worse in the summer. Previously treated with triamcinolonce 0.1% cream with sucess.   Rectal cancer Neg Hx    Stomach cancer Neg Hx     Social History   Socioeconomic History   Marital status: Single    Spouse name: Not on file   Number of children: 3   Years of education: Not on file   Highest education level: Not on file  Occupational History   Not on file  Tobacco Use   Smoking status: Every Day    Types: Cigars   Smokeless tobacco: Never  Vaping Use   Vaping status: Former  Substance and Sexual Activity   Alcohol use: Not Currently   Drug use: Yes    Frequency: 7.0 times per week    Types: Marijuana   Sexual activity: Yes    Birth control/protection: Condom  Other Topics Concern   Not on file  Social History Narrative   [place] in [place] with mother twin brother, 2 older brothers. Father not involved. Twin brother murdered in May 2019.   Social Drivers of Health  Financial Resource Strain: Not on file  Food Insecurity: No Food Insecurity (04/28/2023)   Hunger Vital Sign    Worried About Running Out of Food in the Last Year: Never true    Ran Out of Food in the Last Year: Never true  Transportation Needs: No Transportation Needs (04/28/2023)   PRAPARE - Administrator, Civil Service (Medical): No    Lack of Transportation (Non-Medical): No  Physical Activity: Not on file  Stress: Not on file  Social Connections: Not on file  Intimate Partner Violence: Not At Risk (04/28/2023)   Humiliation, Afraid, Rape, and Kick questionnaire    Fear of Current or Ex-Partner: No    Emotionally Abused: No    Physically Abused: No    Sexually Abused: No    Outpatient Medications Prior to Visit  Medication Sig Dispense  Refill   folic acid  (FOLVITE ) 1 MG tablet Take 1 tablet (1 mg total) by mouth daily. 30 tablet 2   thiamine  (VITAMIN B1) 100 MG tablet Take 1 tablet (100 mg total) by mouth daily. 30 tablet 2   Multiple Vitamin (MULTIVITAMIN WITH MINERALS) TABS tablet Take 1 tablet by mouth daily. (Patient not taking: Reported on 09/17/2023)     pantoprazole  (PROTONIX ) 40 MG tablet Take 1 tablet (40 mg total) by mouth daily. (Patient not taking: Reported on 09/17/2023) 30 tablet 0   Vitamin D , Ergocalciferol , (DRISDOL ) 1.25 MG (50000 UNIT) CAPS capsule Take 1 capsule (50,000 Units total) by mouth every 7 (seven) days. (Patient not taking: Reported on 09/17/2023) 5 capsule 0   No facility-administered medications prior to visit.    No Known Allergies  ROS Review of Systems  Constitutional:  Negative for appetite change, chills, fatigue and fever.  HENT:  Negative for congestion, postnasal drip, rhinorrhea and sneezing.   Eyes:  Negative for pain, discharge and itching.  Respiratory:  Negative for cough, shortness of breath and wheezing.   Cardiovascular:  Negative for chest pain, palpitations and leg swelling.  Gastrointestinal:  Negative for abdominal pain, constipation, nausea and vomiting.  Endocrine: Negative for cold intolerance, heat intolerance and polydipsia.  Genitourinary:  Negative for difficulty urinating, dysuria, flank pain and frequency.  Musculoskeletal:  Negative for arthralgias, back pain, joint swelling and myalgias.  Skin:  Negative for color change, pallor, rash and wound.  Allergic/Immunologic: Negative for food allergies and immunocompromised state.  Neurological:  Negative for dizziness, facial asymmetry, weakness, numbness and headaches.  Psychiatric/Behavioral:  Negative for behavioral problems, confusion, self-injury and suicidal ideas.       Objective:    Physical Exam Vitals and nursing note reviewed.  Constitutional:      General: He is not in acute distress.     Appearance: Normal appearance. He is not ill-appearing, toxic-appearing or diaphoretic.  HENT:     Right Ear: Tympanic membrane, ear canal and external ear normal. There is no impacted cerumen.     Left Ear: Tympanic membrane, ear canal and external ear normal. There is no impacted cerumen.     Nose: Nose normal. No congestion or rhinorrhea.     Mouth/Throat:     Mouth: Mucous membranes are moist.     Pharynx: Oropharynx is clear. No oropharyngeal exudate or posterior oropharyngeal erythema.  Eyes:     General: No scleral icterus.       Right eye: No discharge.        Left eye: No discharge.     Extraocular Movements: Extraocular movements intact.  Conjunctiva/sclera: Conjunctivae normal.  Neck:     Vascular: No carotid bruit.  Cardiovascular:     Rate and Rhythm: Normal rate and regular rhythm.     Pulses: Normal pulses.     Heart sounds: Normal heart sounds. No murmur heard.    No friction rub. No gallop.  Pulmonary:     Effort: Pulmonary effort is normal. No respiratory distress.     Breath sounds: Normal breath sounds. No stridor. No wheezing, rhonchi or rales.  Chest:     Chest wall: No tenderness.  Abdominal:     General: Bowel sounds are normal. There is no distension.     Palpations: Abdomen is soft. There is no mass.     Tenderness: There is no abdominal tenderness. There is no right CVA tenderness, left CVA tenderness, guarding or rebound.     Hernia: No hernia is present.  Musculoskeletal:        General: No swelling, tenderness, deformity or signs of injury.     Cervical back: Normal range of motion and neck supple. No rigidity or tenderness.     Right lower leg: No edema.     Left lower leg: No edema.  Lymphadenopathy:     Cervical: No cervical adenopathy.  Skin:    General: Skin is warm and dry.     Capillary Refill: Capillary refill takes less than 2 seconds.     Coloration: Skin is not jaundiced or pale.     Findings: No bruising, erythema, lesion or rash.   Neurological:     Mental Status: He is alert and oriented to person, place, and time.     Cranial Nerves: No cranial nerve deficit.     Motor: No weakness.     Gait: Gait normal.  Psychiatric:        Mood and Affect: Mood normal.        Behavior: Behavior normal.        Thought Content: Thought content normal.        Judgment: Judgment normal.     BP 118/82   Pulse 60   Temp 97.9 F (36.6 C)   Wt 152 lb 12.8 oz (69.3 kg)   SpO2 100%   BMI 21.92 kg/m  Wt Readings from Last 3 Encounters:  09/17/23 152 lb 12.8 oz (69.3 kg)  07/12/23 155 lb (70.3 kg)  06/18/23 157 lb 6.4 oz (71.4 kg)    No results found for: TSH Lab Results  Component Value Date   WBC 4.0 06/18/2023   HGB 12.9 (L) 06/18/2023   HCT 39.1 06/18/2023   MCV 93 06/18/2023   PLT 296 06/18/2023   Lab Results  Component Value Date   NA 137 06/18/2023   K 4.3 06/18/2023   CO2 22 06/18/2023   GLUCOSE 94 06/18/2023   BUN 10 06/18/2023   CREATININE 1.28 (H) 06/18/2023   BILITOT 0.7 06/18/2023   ALKPHOS 81 06/18/2023   AST 24 06/18/2023   ALT 38 06/18/2023   PROT 7.6 06/18/2023   ALBUMIN 4.7 06/18/2023   CALCIUM  10.0 06/18/2023   ANIONGAP 13 05/08/2023   EGFR 79 06/18/2023   No results found for: CHOL No results found for: HDL No results found for: LDLCALC No results found for: TRIG No results found for: CHOLHDL No results found for: YHAJ8R    Assessment & Plan:   Problem List Items Addressed This Visit       Other   Substance abuse (HCC)   Need to  quit smoking marijuana discussed.       Alcohol abuse   Encouraged to continue to abstain from drinking alcohol Takes folic acid  1 mg daily and thiamine  100 mg daily      Vitamin D  deficiency   Not on vitamin D  supplement Rechecking labs      Annual physical exam - Primary   Annual exam as documented.  Counseling done include healthy lifestyle involving committing to 150 minutes of exercise per week, heart healthy diet, The  importance of adequate sleep also discussed.  Regular use of seat belt and home safety were also discussed . Changes in health habits are decided on by patient with goals and time frames set for achieving them. Immunization and cancer screening  needs are specifically addressed at this visit.         Tobacco use   Need to stop smoking tobacco discussed      Other Visit Diagnoses       Screening for endocrine, nutritional, metabolic and immunity disorder       Relevant Orders   CBC   CMP14+EGFR   Lipid panel   VITAMIN D  25 Hydroxy (Vit-D Deficiency, Fractures)       No orders of the defined types were placed in this encounter.   Follow-up: Return in about 1 year (around 09/16/2024) for CPE.    Lilith Solana R Shaunta Oncale, FNP

## 2023-09-17 NOTE — Assessment & Plan Note (Signed)
 Annual exam as documented.  Counseling done include healthy lifestyle involving committing to 150 minutes of exercise per week, heart healthy diet,  The importance of adequate sleep also discussed.  Regular use of seat belt and home safety were also discussed . Changes in health habits are decided on by patient with goals and time frames set for achieving them. Immunization and cancer screening  needs are specifically addressed at this visit.

## 2023-09-17 NOTE — Assessment & Plan Note (Signed)
 Not on vitamin D  supplement Rechecking labs

## 2023-09-17 NOTE — Assessment & Plan Note (Signed)
 Need to quit smoking marijuana discussed

## 2023-09-18 ENCOUNTER — Ambulatory Visit: Payer: Self-pay | Admitting: Nurse Practitioner

## 2023-09-18 LAB — CBC
Hematocrit: 41.3 % (ref 37.5–51.0)
Hemoglobin: 13.6 g/dL (ref 13.0–17.7)
MCH: 30.8 pg (ref 26.6–33.0)
MCHC: 32.9 g/dL (ref 31.5–35.7)
MCV: 94 fL (ref 79–97)
Platelets: 252 x10E3/uL (ref 150–450)
RBC: 4.41 x10E6/uL (ref 4.14–5.80)
RDW: 12.2 % (ref 11.6–15.4)
WBC: 4.9 x10E3/uL (ref 3.4–10.8)

## 2023-09-18 LAB — LIPID PANEL
Chol/HDL Ratio: 4.2 ratio (ref 0.0–5.0)
Cholesterol, Total: 188 mg/dL (ref 100–199)
HDL: 45 mg/dL (ref >39)
LDL Chol Calc (NIH): 131 mg/dL — ABNORMAL HIGH (ref 0–99)
Triglycerides: 66 mg/dL (ref 0–149)
VLDL Cholesterol Cal: 12 mg/dL (ref 5–40)

## 2023-09-18 LAB — CMP14+EGFR
ALT: 17 IU/L (ref 0–44)
AST: 20 IU/L (ref 0–40)
Albumin: 4.4 g/dL (ref 4.3–5.2)
Alkaline Phosphatase: 71 IU/L (ref 44–121)
BUN/Creatinine Ratio: 8 — ABNORMAL LOW (ref 9–20)
BUN: 9 mg/dL (ref 6–20)
Bilirubin Total: 0.5 mg/dL (ref 0.0–1.2)
CO2: 22 mmol/L (ref 20–29)
Calcium: 9.7 mg/dL (ref 8.7–10.2)
Chloride: 105 mmol/L (ref 96–106)
Creatinine, Ser: 1.12 mg/dL (ref 0.76–1.27)
Globulin, Total: 2.8 g/dL (ref 1.5–4.5)
Glucose: 87 mg/dL (ref 70–99)
Potassium: 4.1 mmol/L (ref 3.5–5.2)
Sodium: 141 mmol/L (ref 134–144)
Total Protein: 7.2 g/dL (ref 6.0–8.5)
eGFR: 93 mL/min/1.73 (ref >59)

## 2023-09-18 LAB — VITAMIN D 25 HYDROXY (VIT D DEFICIENCY, FRACTURES): Vit D, 25-Hydroxy: 22 ng/mL — ABNORMAL LOW (ref 30.0–100.0)

## 2023-11-04 DIAGNOSIS — Z202 Contact with and (suspected) exposure to infections with a predominantly sexual mode of transmission: Secondary | ICD-10-CM | POA: Diagnosis not present

## 2023-11-04 DIAGNOSIS — Z113 Encounter for screening for infections with a predominantly sexual mode of transmission: Secondary | ICD-10-CM | POA: Diagnosis not present

## 2023-12-25 DIAGNOSIS — F1910 Other psychoactive substance abuse, uncomplicated: Secondary | ICD-10-CM | POA: Diagnosis not present

## 2023-12-25 DIAGNOSIS — I340 Nonrheumatic mitral (valve) insufficiency: Secondary | ICD-10-CM | POA: Diagnosis not present

## 2023-12-25 DIAGNOSIS — M6282 Rhabdomyolysis: Secondary | ICD-10-CM | POA: Diagnosis not present

## 2023-12-25 DIAGNOSIS — N179 Acute kidney failure, unspecified: Secondary | ICD-10-CM | POA: Diagnosis not present

## 2024-01-01 ENCOUNTER — Other Ambulatory Visit: Payer: Self-pay | Admitting: Nurse Practitioner

## 2024-03-06 ENCOUNTER — Encounter (HOSPITAL_COMMUNITY): Payer: Self-pay

## 2024-03-06 ENCOUNTER — Other Ambulatory Visit: Payer: Self-pay

## 2024-03-06 ENCOUNTER — Emergency Department (HOSPITAL_COMMUNITY)
Admission: EM | Admit: 2024-03-06 | Discharge: 2024-03-06 | Disposition: A | Discharge status: Home / Self Care | Attending: Emergency Medicine | Admitting: Emergency Medicine

## 2024-03-06 DIAGNOSIS — K920 Hematemesis: Secondary | ICD-10-CM | POA: Diagnosis not present

## 2024-03-06 DIAGNOSIS — E872 Acidosis, unspecified: Secondary | ICD-10-CM | POA: Diagnosis not present

## 2024-03-06 DIAGNOSIS — K226 Gastro-esophageal laceration-hemorrhage syndrome: Secondary | ICD-10-CM | POA: Diagnosis not present

## 2024-03-06 DIAGNOSIS — R112 Nausea with vomiting, unspecified: Secondary | ICD-10-CM | POA: Diagnosis present

## 2024-03-06 DIAGNOSIS — D72829 Elevated white blood cell count, unspecified: Secondary | ICD-10-CM | POA: Diagnosis not present

## 2024-03-06 LAB — COMPREHENSIVE METABOLIC PANEL WITH GFR
ALT: 29 U/L (ref 0–44)
AST: 49 U/L — ABNORMAL HIGH (ref 15–41)
Albumin: 4.9 g/dL (ref 3.5–5.0)
Alkaline Phosphatase: 92 U/L (ref 38–126)
Anion gap: 22 — ABNORMAL HIGH (ref 5–15)
BUN: 18 mg/dL (ref 6–20)
CO2: 19 mmol/L — ABNORMAL LOW (ref 22–32)
Calcium: 10.2 mg/dL (ref 8.9–10.3)
Chloride: 98 mmol/L (ref 98–111)
Creatinine, Ser: 1.26 mg/dL — ABNORMAL HIGH (ref 0.61–1.24)
GFR, Estimated: >60 mL/min
Glucose, Bld: 119 mg/dL — ABNORMAL HIGH (ref 70–99)
Potassium: 4 mmol/L (ref 3.5–5.1)
Sodium: 139 mmol/L (ref 135–145)
Total Bilirubin: 0.8 mg/dL (ref 0.0–1.2)
Total Protein: 8.4 g/dL — ABNORMAL HIGH (ref 6.5–8.1)

## 2024-03-06 LAB — CBC
HCT: 43 % (ref 39.0–52.0)
Hemoglobin: 14.1 g/dL (ref 13.0–17.0)
MCH: 30.9 pg (ref 26.0–34.0)
MCHC: 32.8 g/dL (ref 30.0–36.0)
MCV: 94.1 fL (ref 80.0–100.0)
Platelets: 305 K/uL (ref 150–400)
RBC: 4.57 MIL/uL (ref 4.22–5.81)
RDW: 13.5 % (ref 11.5–15.5)
WBC: 17.2 K/uL — ABNORMAL HIGH (ref 4.0–10.5)
nRBC: 0 % (ref 0.0–0.2)

## 2024-03-06 LAB — ETHANOL: Alcohol, Ethyl (B): <15 mg/dL

## 2024-03-06 LAB — LIPASE, BLOOD: Lipase: 19 U/L (ref 11–51)

## 2024-03-06 MED ORDER — PANTOPRAZOLE SODIUM 40 MG IV SOLR
40.0000 mg | Freq: Once | INTRAVENOUS | Status: AC
  Administered 2024-03-06: 40 mg via INTRAVENOUS
  Filled 2024-03-06: qty 10

## 2024-03-06 MED ORDER — DIPHENHYDRAMINE HCL 50 MG/ML IJ SOLN
12.5000 mg | Freq: Once | INTRAMUSCULAR | Status: AC
  Administered 2024-03-06: 12.5 mg via INTRAVENOUS
  Filled 2024-03-06: qty 1

## 2024-03-06 MED ORDER — DROPERIDOL 2.5 MG/ML IJ SOLN
1.2500 mg | Freq: Once | INTRAMUSCULAR | Status: AC
  Administered 2024-03-06: 1.25 mg via INTRAVENOUS
  Filled 2024-03-06: qty 2

## 2024-03-06 MED ORDER — ONDANSETRON HCL 4 MG/2ML IJ SOLN: 4.0000 mg | Freq: Once | INTRAMUSCULAR | Status: DC

## 2024-03-06 MED ORDER — ONDANSETRON 4 MG PO TBDP: 4.0000 mg | ORAL_TABLET | Freq: Three times a day (TID) | ORAL | 0 refills | Status: AC | PRN

## 2024-03-06 MED ORDER — ONDANSETRON 4 MG PO TBDP
4.0000 mg | ORAL_TABLET | Freq: Once | ORAL | Status: AC | PRN
  Administered 2024-03-06: 4 mg via ORAL
  Filled 2024-03-06: qty 1

## 2024-03-06 MED ORDER — LACTATED RINGERS IV BOLUS
1000.0000 mL | Freq: Once | INTRAVENOUS | Status: AC
  Administered 2024-03-06: 1000 mL via INTRAVENOUS

## 2024-03-06 MED ORDER — PANTOPRAZOLE SODIUM 40 MG PO TBEC: 40.0000 mg | DELAYED_RELEASE_TABLET | Freq: Every day | ORAL | 0 refills | Status: AC

## 2024-03-06 NOTE — ED Triage Notes (Signed)
 BIB [facility]. He started vomiting blood 30 minutes ago. Similar to 10 months ago when he had the same thing happen after drinking alcohol the night before. EMS reports he did not vomit enroute, he is dry heaving on arrival to ED.    128/78 70 18 98%RA  CBG 109

## 2024-03-06 NOTE — Discharge Instructions (Signed)
 You were seen for your vomiting in the emergency department.  At home, please take the Zofran  for your nausea and vomiting. Please be sure to stay well-hydrated.  Follow-up with your primary doctor in 2-3 days regarding your visit.  Return immediately to the emergency department if you experience any of the following: fainting, abdominal pain, high fevers, or any other concerning symptoms.  Thank you for visiting our Emergency Department. It was a pleasure taking care of you today.

## 2024-03-06 NOTE — ED Provider Notes (Signed)
 " [place] EMERGENCY DEPARTMENT AT [facility] Provider Note   CSN: [id number] Arrival date & time: 03/06/24  1533     Patient presents with: Hematemesis   Andrew Page is a 29 y.o. male.   29 year old male history of alcohol use and daily THC use who presents emergency department nausea and vomiting.  Says he went out partying last night and this morning woke up and had nausea and vomiting.  Has had 20 episodes.  After several started to notice some blood-streaked vomit.  No abdominal pain.  No diarrhea.  Not on blood thinners.       Prior to Admission medications  Medication Sig Start Date End Date Taking? Authorizing Provider  ondansetron  (ZOFRAN -ODT) 4 MG disintegrating tablet Take 1 tablet (4 mg total) by mouth every 8 (eight) hours as needed for nausea or vomiting. 03/06/24  Yes Yolande Lamar BROCKS, MD  folic acid  (FOLVITE ) 1 MG tablet TAKE 1 TABLET BY MOUTH EVERY DAY 01/01/24   Paseda, Folashade R, FNP  Multiple Vitamin (MULTIVITAMIN WITH MINERALS) TABS tablet Take 1 tablet by mouth daily. Patient not taking: Reported on 09/17/2023 05/08/23   Cheryle Page, MD  pantoprazole  (PROTONIX ) 40 MG tablet Take 1 tablet (40 mg total) by mouth daily. 03/06/24   Yolande Lamar BROCKS, MD  thiamine  (VITAMIN B1) 100 MG tablet Take 1 tablet (100 mg total) by mouth daily. 06/18/23   Paseda, Folashade R, FNP  Vitamin D , Ergocalciferol , (DRISDOL ) 1.25 MG (50000 UNIT) CAPS capsule Take 1 capsule (50,000 Units total) by mouth every 7 (seven) days. Patient not taking: Reported on 09/17/2023 05/13/23   Cheryle Page, MD    Allergies: Patient has no known allergies.    Review of Systems  Updated Vital Signs BP 119/68 (BP Location: Right Arm)   Pulse 86   Temp 98.6 F (37 C) (Oral)   Resp 18   Ht 5' 10 (1.778 m)   Wt 64.4 kg   SpO2 100%   BMI 20.37 kg/m   Physical Exam Vitals and nursing note reviewed.  Constitutional:      General: He is not in acute distress.    Appearance:  He is well-developed.  HENT:     Head: Normocephalic and atraumatic.     Right Ear: External ear normal.     Left Ear: External ear normal.     Nose: Nose normal.  Eyes:     Extraocular Movements: Extraocular movements intact.     Conjunctiva/sclera: Conjunctivae normal.     Pupils: Pupils are equal, round, and reactive to light.  Cardiovascular:     Rate and Rhythm: Normal rate and regular rhythm.     Heart sounds: Normal heart sounds.  Pulmonary:     Effort: Pulmonary effort is normal. No respiratory distress.     Breath sounds: Normal breath sounds.  Abdominal:     General: There is no distension.     Palpations: Abdomen is soft. There is no mass.     Tenderness: There is no abdominal tenderness. There is no guarding.  Musculoskeletal:     Cervical back: Normal range of motion and neck supple.     Right lower leg: No edema.     Left lower leg: No edema.  Skin:    General: Skin is warm and dry.  Neurological:     Mental Status: He is alert. Mental status is at baseline.  Psychiatric:        Mood and Affect: Mood normal.  Behavior: Behavior normal.     (all labs ordered are listed, but only abnormal results are displayed) Labs Reviewed  COMPREHENSIVE METABOLIC PANEL WITH GFR - Abnormal; Notable for the following components:      Result Value   CO2 19 (*)    Glucose, Bld 119 (*)    Creatinine, Ser 1.26 (*)    Total Protein 8.4 (*)    AST 49 (*)    Anion gap 22 (*)    All other components within normal limits  CBC - Abnormal; Notable for the following components:   WBC 17.2 (*)    All other components within normal limits  LIPASE, BLOOD  ETHANOL    EKG: None  Radiology: No results found.   Procedures   Medications Ordered in the ED  ondansetron  (ZOFRAN -ODT) disintegrating tablet 4 mg (4 mg Oral Given 03/06/24 1540)  lactated ringers  bolus 1,000 mL (0 mLs Intravenous Stopped 03/06/24 1819)  pantoprazole  (PROTONIX ) injection 40 mg (40 mg Intravenous  Given 03/06/24 1657)  droperidol  (INAPSINE ) 2.5 MG/ML injection 1.25 mg (1.25 mg Intravenous Given 03/06/24 1655)  diphenhydrAMINE  (BENADRYL ) injection 12.5 mg (12.5 mg Intravenous Given 03/06/24 1653)                                    Medical Decision Making Amount and/or Complexity of Data Reviewed Labs: ordered.  Risk Prescription drug management.   Andrew Page is a 29 year old male history of alcohol and THC use who presents emergency department nausea and vomiting  Initial Ddx:  Pancreatitis, gastritis, hangover, alcoholic ketoacidosis, dehydration  MDM/Course:  Patient presents emergency department with nausea and vomiting.  No abdominal pain.  On exam vital signs reassuring.  No abdominal tenderness palpation.  This is in the setting of drinking heavily last night.  Lab work was obtained which shows a leukocytosis which I suspect is reactive.  Does have anion gap metabolic acidosis which I suspect is from GI losses.  Lab work showed creatinine that is consistent with priors.  Was given some IV fluids.  Also given antiemetics and was feeling much better.  The blood in his emesis I suspect was from a Mallory-Weiss tear.  Did have additional emesis here with no blood in it.  Upon re-evaluation we will start him on Protonix  and give him Zofran  and have him follow-up as an outpatient.  This patient presents to the ED for concern of complaints listed in HPI, this involves an extensive number of treatment options, and is a complaint that carries with it a high risk of complications and morbidity. Disposition including potential need for admission considered.   Dispo: DC Home. Return precautions discussed including, but not limited to, those listed in the AVS. Allowed pt time to ask questions which were answered fully prior to dc.  I have reviewed the patients home medications and made adjustments as needed Records reviewed Outpatient Clinic Notes The following labs were independently  interpreted: Chemistry and show no acute abnormality I personally reviewed and interpreted cardiac monitoring: normal sinus rhythm  I personally reviewed and interpreted the pt's EKG: see above for interpretation   Portions of this note were generated with Dragon dictation software. Dictation errors may occur despite best attempts at proofreading.     Final diagnoses:  Hematemesis with nausea  Mallory-Weiss tear    ED Discharge Orders          Ordered    ondansetron  (ZOFRAN -ODT)  4 MG disintegrating tablet  Every 8 hours PRN        03/06/24 1806    pantoprazole  (PROTONIX ) 40 MG tablet  Daily        03/06/24 1806               Yolande Lamar BROCKS, MD 03/06/24 2347  "

## 2024-09-16 ENCOUNTER — Encounter: Payer: Self-pay | Admitting: Nurse Practitioner
